# Patient Record
Sex: Female | Born: 1935 | Race: White | Hispanic: No | Smoking: Current every day smoker
Health system: Southern US, Community
[De-identification: ages and names within clinical notes are randomized; demographics above are authoritative.]

## PROBLEM LIST (undated history)

## (undated) DIAGNOSIS — E785 Hyperlipidemia, unspecified: Secondary | ICD-10-CM

## (undated) DIAGNOSIS — R911 Solitary pulmonary nodule: Secondary | ICD-10-CM

## (undated) DIAGNOSIS — G629 Polyneuropathy, unspecified: Secondary | ICD-10-CM

## (undated) DIAGNOSIS — K635 Polyp of colon: Secondary | ICD-10-CM

## (undated) DIAGNOSIS — K449 Diaphragmatic hernia without obstruction or gangrene: Secondary | ICD-10-CM

## (undated) DIAGNOSIS — K219 Gastro-esophageal reflux disease without esophagitis: Secondary | ICD-10-CM

## (undated) DIAGNOSIS — K589 Irritable bowel syndrome without diarrhea: Secondary | ICD-10-CM

## (undated) DIAGNOSIS — K5790 Diverticulosis of intestine, part unspecified, without perforation or abscess without bleeding: Secondary | ICD-10-CM

## (undated) DIAGNOSIS — M1990 Unspecified osteoarthritis, unspecified site: Secondary | ICD-10-CM

## (undated) DIAGNOSIS — M5481 Occipital neuralgia: Secondary | ICD-10-CM

## (undated) DIAGNOSIS — K648 Other hemorrhoids: Secondary | ICD-10-CM

## (undated) DIAGNOSIS — T8859XA Other complications of anesthesia, initial encounter: Secondary | ICD-10-CM

## (undated) DIAGNOSIS — F32A Depression, unspecified: Secondary | ICD-10-CM

## (undated) DIAGNOSIS — D649 Anemia, unspecified: Secondary | ICD-10-CM

## (undated) HISTORY — PX: CATARACT EXTRACTION: SUR2

## (undated) HISTORY — DX: Unspecified osteoarthritis, unspecified site: M19.90

## (undated) HISTORY — DX: Polyneuropathy, unspecified: G62.9

## (undated) HISTORY — DX: Solitary pulmonary nodule: R91.1

## (undated) HISTORY — DX: Irritable bowel syndrome, unspecified: K58.9

## (undated) HISTORY — DX: Gastro-esophageal reflux disease without esophagitis: K21.9

## (undated) HISTORY — DX: Other hemorrhoids: K64.8

## (undated) HISTORY — DX: Occipital neuralgia: M54.81

## (undated) HISTORY — DX: Hyperlipidemia, unspecified: E78.5

## (undated) HISTORY — DX: Polyp of colon: K63.5

## (undated) HISTORY — PX: CHOLECYSTECTOMY: SHX55

## (undated) HISTORY — DX: Anemia, unspecified: D64.9

## (undated) HISTORY — DX: Diaphragmatic hernia without obstruction or gangrene: K44.9

## (undated) HISTORY — DX: Diverticulosis of intestine, part unspecified, without perforation or abscess without bleeding: K57.90

## (undated) HISTORY — DX: Depression, unspecified: F32.A

---

## 1957-02-12 HISTORY — PX: APPENDECTOMY: SHX54

## 1970-02-12 HISTORY — PX: ABDOMINAL HYSTERECTOMY: SHX81

## 1998-01-05 ENCOUNTER — Observation Stay (HOSPITAL_COMMUNITY): Admission: RE | Admit: 1998-01-05 | Discharge: 1998-01-06 | Payer: Self-pay | Admitting: Surgery

## 1998-01-05 ENCOUNTER — Encounter: Payer: Self-pay | Admitting: Surgery

## 1999-02-14 ENCOUNTER — Observation Stay (HOSPITAL_COMMUNITY): Admission: RE | Admit: 1999-02-14 | Discharge: 1999-02-15 | Payer: Self-pay | Admitting: Urology

## 1999-05-10 ENCOUNTER — Encounter: Admission: RE | Admit: 1999-05-10 | Discharge: 1999-05-10 | Payer: Self-pay | Admitting: Internal Medicine

## 1999-05-10 ENCOUNTER — Encounter: Payer: Self-pay | Admitting: Internal Medicine

## 1999-05-19 ENCOUNTER — Encounter: Payer: Self-pay | Admitting: Internal Medicine

## 1999-05-19 ENCOUNTER — Encounter: Admission: RE | Admit: 1999-05-19 | Discharge: 1999-05-19 | Payer: Self-pay | Admitting: Internal Medicine

## 1999-05-22 ENCOUNTER — Ambulatory Visit (HOSPITAL_COMMUNITY): Admission: RE | Admit: 1999-05-22 | Discharge: 1999-05-22 | Payer: Self-pay | Admitting: Neurosurgery

## 1999-05-24 ENCOUNTER — Encounter: Payer: Self-pay | Admitting: Neurosurgery

## 1999-05-24 ENCOUNTER — Ambulatory Visit (HOSPITAL_COMMUNITY): Admission: RE | Admit: 1999-05-24 | Discharge: 1999-05-24 | Payer: Self-pay | Admitting: Neurosurgery

## 2000-03-05 ENCOUNTER — Encounter: Admission: RE | Admit: 2000-03-05 | Discharge: 2000-03-05 | Payer: Self-pay | Admitting: Internal Medicine

## 2000-03-05 ENCOUNTER — Encounter: Payer: Self-pay | Admitting: Internal Medicine

## 2000-03-18 ENCOUNTER — Other Ambulatory Visit: Admission: RE | Admit: 2000-03-18 | Discharge: 2000-03-18 | Payer: Self-pay | Admitting: Internal Medicine

## 2000-08-16 ENCOUNTER — Ambulatory Visit (HOSPITAL_COMMUNITY): Admission: RE | Admit: 2000-08-16 | Discharge: 2000-08-16 | Payer: Self-pay | Admitting: Urology

## 2001-03-12 ENCOUNTER — Encounter: Admission: RE | Admit: 2001-03-12 | Discharge: 2001-03-12 | Payer: Self-pay | Admitting: Internal Medicine

## 2001-03-12 ENCOUNTER — Encounter: Payer: Self-pay | Admitting: Internal Medicine

## 2002-03-19 ENCOUNTER — Encounter: Admission: RE | Admit: 2002-03-19 | Discharge: 2002-03-19 | Payer: Self-pay | Admitting: Internal Medicine

## 2002-03-19 ENCOUNTER — Encounter: Payer: Self-pay | Admitting: Internal Medicine

## 2003-03-22 ENCOUNTER — Encounter: Admission: RE | Admit: 2003-03-22 | Discharge: 2003-03-22 | Payer: Self-pay | Admitting: Internal Medicine

## 2004-03-08 ENCOUNTER — Ambulatory Visit: Payer: Self-pay | Admitting: Internal Medicine

## 2004-03-22 ENCOUNTER — Encounter: Admission: RE | Admit: 2004-03-22 | Discharge: 2004-03-22 | Payer: Self-pay | Admitting: Internal Medicine

## 2004-04-26 ENCOUNTER — Encounter: Admission: RE | Admit: 2004-04-26 | Discharge: 2004-04-26 | Payer: Self-pay | Admitting: Internal Medicine

## 2005-04-10 ENCOUNTER — Encounter: Admission: RE | Admit: 2005-04-10 | Discharge: 2005-04-10 | Payer: Self-pay | Admitting: Internal Medicine

## 2005-04-26 ENCOUNTER — Encounter: Admission: RE | Admit: 2005-04-26 | Discharge: 2005-04-26 | Payer: Self-pay | Admitting: Internal Medicine

## 2005-07-31 ENCOUNTER — Encounter: Admission: RE | Admit: 2005-07-31 | Discharge: 2005-07-31 | Payer: Self-pay | Admitting: Internal Medicine

## 2006-09-16 ENCOUNTER — Ambulatory Visit: Payer: Self-pay | Admitting: Internal Medicine

## 2006-10-16 ENCOUNTER — Encounter: Admission: RE | Admit: 2006-10-16 | Discharge: 2006-10-16 | Payer: Self-pay | Admitting: Internal Medicine

## 2006-11-28 ENCOUNTER — Ambulatory Visit: Payer: Self-pay | Admitting: Internal Medicine

## 2006-11-28 ENCOUNTER — Encounter: Payer: Self-pay | Admitting: Internal Medicine

## 2007-06-08 ENCOUNTER — Encounter: Admission: RE | Admit: 2007-06-08 | Discharge: 2007-06-08 | Payer: Self-pay | Admitting: Orthopaedic Surgery

## 2007-06-24 ENCOUNTER — Encounter: Admission: RE | Admit: 2007-06-24 | Discharge: 2007-06-24 | Payer: Self-pay | Admitting: Orthopaedic Surgery

## 2007-10-17 ENCOUNTER — Encounter: Admission: RE | Admit: 2007-10-17 | Discharge: 2007-10-17 | Payer: Self-pay | Admitting: Internal Medicine

## 2008-10-20 ENCOUNTER — Encounter: Admission: RE | Admit: 2008-10-20 | Discharge: 2008-10-20 | Payer: Self-pay | Admitting: Internal Medicine

## 2009-10-26 ENCOUNTER — Encounter: Admission: RE | Admit: 2009-10-26 | Discharge: 2009-10-26 | Payer: Self-pay | Admitting: Internal Medicine

## 2009-10-28 ENCOUNTER — Emergency Department (HOSPITAL_COMMUNITY): Admission: EM | Admit: 2009-10-28 | Discharge: 2009-10-28 | Payer: Self-pay | Admitting: Emergency Medicine

## 2010-02-27 ENCOUNTER — Encounter
Admission: RE | Admit: 2010-02-27 | Discharge: 2010-02-27 | Payer: Self-pay | Source: Home / Self Care | Attending: Internal Medicine | Admitting: Internal Medicine

## 2010-03-05 ENCOUNTER — Encounter: Payer: Self-pay | Admitting: Internal Medicine

## 2010-04-24 ENCOUNTER — Encounter (INDEPENDENT_AMBULATORY_CARE_PROVIDER_SITE_OTHER): Payer: Self-pay | Admitting: *Deleted

## 2010-05-02 NOTE — Letter (Signed)
Summary: New Patient letter  [facility]  [address] [place], [place] [address]   Phone: [phone]  Fax: [phone]       04/24/2010 MRN: [id number]  Candice Brown [address] [place], [place]  [address]  Dear Ms. Roberson,  Welcome to the Gastroenterology Division at [facility].    You are scheduled to see Dr.  Yancey Flemings on June 01, 2010 at 9:30am on the 3rd floor at [facility], [address]  We ask that you try to arrive at our office 15 minutes prior to your appointment time to allow for check-in.  We would like you to complete the enclosed self-administered evaluation form prior to your visit and bring it with you on the day of your appointment.  We will review it with you.  Also, please bring a complete list of all your medications or, if you prefer, bring the medication bottles and we will list them.  Please bring your insurance card so that we may make a copy of it.  If your insurance requires a referral to see a specialist, please bring your referral form from your primary care physician.  Co-payments are due at the time of your visit and may be paid by cash, check or credit card.     Your office visit will consist of a consult with your physician (includes a physical exam), any laboratory testing he/she may order, scheduling of any necessary diagnostic testing (e.g. x-ray, ultrasound, CT-scan), and scheduling of a procedure (e.g. Endoscopy, Colonoscopy) if required.  Please allow enough time on your schedule to allow for any/all of these possibilities.    If you cannot keep your appointment, please call [phone] to cancel or reschedule prior to your appointment date.  This allows [place] the opportunity to schedule an appointment for another patient in need of care.  If you do not cancel or reschedule by 5 p.m. the business day prior to your appointment date, you will be charged a $50.00 late cancellation/no-show fee.    Thank you for  choosing [facility] for your medical needs.  We appreciate the opportunity to care for you.  Please visit [place] at our website  to learn more about our practice.                     Sincerely,                                                             The Gastroenterology Division

## 2010-06-01 ENCOUNTER — Encounter: Payer: Self-pay | Admitting: Internal Medicine

## 2010-06-01 ENCOUNTER — Ambulatory Visit (INDEPENDENT_AMBULATORY_CARE_PROVIDER_SITE_OTHER): Payer: Medicare Other | Admitting: Internal Medicine

## 2010-06-01 VITALS — BP 98/60 | HR 68 | Ht 64.0 in | Wt 141.0 lb

## 2010-06-01 DIAGNOSIS — K219 Gastro-esophageal reflux disease without esophagitis: Secondary | ICD-10-CM

## 2010-06-01 DIAGNOSIS — R198 Other specified symptoms and signs involving the digestive system and abdomen: Secondary | ICD-10-CM

## 2010-06-01 DIAGNOSIS — K5900 Constipation, unspecified: Secondary | ICD-10-CM

## 2010-06-01 DIAGNOSIS — R933 Abnormal findings on diagnostic imaging of other parts of digestive tract: Secondary | ICD-10-CM

## 2010-06-01 NOTE — Progress Notes (Signed)
HISTORY OF PRESENT ILLNESS:  Candice Brown is a 77 y.o. female with the below listed medical history who presents today regarding difficulty with bowel movements and abnormal physical sensation. She has been seen previously for reflux disease and screening colonoscopy. Prior upper endoscopy revealed erosive esophagitis and peptic stricture. Treatment with proton pump inhibitor therapy resolved symptoms. Currently with no active upper GI symptoms. Prior colonoscopies in 2003 and 2008 revealing diverticulosis, internal hemorrhoids, cecal angiodysplasia, and hyperplastic polyps. She presents today with chief complaint of "a large pouch on the right side of the rectum making it difficult to defecate" of 6 months duration. She states that this "pouch" is internal and varies in size with time. She also states her bowel habits have been more difficult with straining as well as being hard and large. It was recommended that she take stool softeners. She resisted. She does take fiber tablets. She denies abdominal pain or bleeding. GI review of systems is otherwise negative. She apparently had some abdominal pain on the left in January 2012 when she underwent a CT scan of the abdomen and pelvis which was unremarkable except for incidental diverticulosis, lumbar scoliosis with spondylosis, and emphysema. Her appetite and weight have been stable  REVIEW OF SYSTEMS:  All non-GI ROS negative except for chronic back pain, leg cramps, and insomnia.  Past Medical History  Diagnosis Date  . Diverticulosis   . Arthritis   . Hyperlipidemia   . IBS (irritable bowel syndrome)     Past Surgical History  Procedure Date  . Cholecystectomy   . Appendectomy 1959  . Abdominal hysterectomy 1972    Social History Candice Brown  reports that she has been smoking Cigarettes.  She has been smoking about 1 pack per day. She does not have any smokeless tobacco history on file. She reports that she drinks alcohol. She reports  that she does not use illicit drugs.  family history includes Heart disease in her father.  There is no history of Colon cancer.  Allergies  Allergen Reactions  . Neosporin (Neomycin-Polymyx-Gramicid) Rash       PHYSICAL EXAMINATION: Vital signs: BP 98/60  Pulse 68  Ht 5\' 4"  (1.626 m)  Wt 141 lb (63.957 kg)  BMI 24.20 kg/m2  Constitutional: generally well-appearing, no acute distress Psychiatric: alert and oriented x3, cooperative Eyes: extraocular movements intact, anicteric, conjunctiva pink Mouth: oral pharynx moist, no lesions Neck: supple no lymphadenopathy Cardiovascular: heart regular rate and rhythm, no murmur Lungs: clear to auscultation bilaterally Abdomen: soft, nontender, nondistended, no obvious ascites, no peritoneal signs, normal bowel sounds, no organomegaly Rectal: No external abnormalities. No obvious mass or pouch. Internal hemorrhoids, one prolapsing. Not inflamed. No internal mass or tenderness. Stool is Hemoccult negative. Extremities: no lower extremity edema bilaterally Skin: no lesions on visible extremities Neuro: No focal deficits. No asterixis.   ASSESSMENT:  #1. Abnormal sensation in the right rectum associated with difficult bowel movements. Suspect problem is constipation with increased stool burden in the rectal vault leading to symptoms. She is resistant to this idea and feels there something anatomic to explain the symptom.  #2. Colonoscopy in 2003 and 2008 as described. Negative CT scan January 2012 of the abdomen and pelvis  #3. Constipation  #4. GERD with erosive esophagitis and peptic stricture. Currently asymptomatic on omeprazole   PLAN:  #1. Advise a trial of MiraLax. Titrate to need to achieve easier bowel movements #2. MRI of the Pelvis to further evaluate the perirectal area for subtle abnormalities not  appreciated on physical exam #3. Continue reflux precautions and omeprazole #4. Further recommendations to follow

## 2010-06-01 NOTE — Patient Instructions (Addendum)
MRI of Pelvis with and without contrast   [facility]   Monday 4/23 7:00 pm arrive at 6:45 pm We will contact you once the results are available for Dr. Marina Goodell to review.  MiraLax as recommended by Dr. Marina Goodell

## 2010-06-02 ENCOUNTER — Encounter: Payer: Self-pay | Admitting: Internal Medicine

## 2010-06-02 ENCOUNTER — Telehealth: Payer: Self-pay | Admitting: *Deleted

## 2010-06-02 NOTE — Telephone Encounter (Signed)
Message copied by Milford Cage on Fri Jun 02, 2010  5:38 PM ------      Message from: Yancey Flemings      Created: Fri Jun 02, 2010 11:48 AM      Regarding: RE: MRI scheduled Monday 4/23       Ask PA/NP to call for me. I can speak w/ them first Re why MRI.            jp      ----- Message -----         From: Milford Cage, CMA         Sent: 06/02/2010  10:24 AM           To: Yancey Flemings, MD      Subject: MRI scheduled Monday 4/23                                            ----- Message -----         From: Dwan Bolt         Sent: 06/02/2010   9:07 AM           To: Milford Cage, CMA            [employer]) will not approve her MRI Pelvis without a Peer-to-Peer review. They have all clinical information but still request to speak to Dr Marina Goodell or a NP/PA can call also.            [phone] opt 2      MemberID# [id number]            Pt's MRI is scheduled for Monday, 06/05/10

## 2010-06-02 NOTE — Telephone Encounter (Signed)
Called patient to inform her the insurance co. Will not approve her MRI.  Amy Esterwood, PA called and tried to give them clinical information to get this approved.  Suggestions were given by insurance co. And relayed to Dr. Marina Goodell in note given by Amy.   Called and cxld MRI until further notice from by Dr. Marina Goodell.

## 2010-06-05 ENCOUNTER — Other Ambulatory Visit (HOSPITAL_COMMUNITY): Payer: Medicare Other

## 2010-06-05 NOTE — Telephone Encounter (Signed)
Dr. Lenard Galloway do you want to do for this patient?  Please advise since insurance will not pay for MRI.  Thanks.

## 2010-06-16 NOTE — Telephone Encounter (Signed)
Nothing else to do.  Patient notified MRI not covered.

## 2010-06-16 NOTE — Telephone Encounter (Signed)
Pt. Notified of denial.   Dr. Marina Goodell wishes not to do anything further.

## 2010-06-30 NOTE — Op Note (Signed)
[facility]  Patient:    DEONI, COSEY                          MRN: [id number]. Date: 08/16/00 Attending:  Jamison Neighbor, M.D.                           Operative Report  SERVICE:  Urology.  PREOPERATIVE DIAGNOSIS:  Stress urinary incontinence type 3.  POSTOPERATIVE DIAGNOSIS:  Stress urinary incontinence type 3.  PROCEDURE:  Cystoscopy and collagen injection.  SURGEON:  Dr. Logan Bores.  ANESTHESIA:  General.  COMPLICATIONS:  None.  DRAINS:  None.  BRIEF HISTORY:  This 77 year old female status post pubovaginal sling. She had excellent urinary control but has over the last few months has begun to develop a modest degree of stress urinary incontinence. Careful evaluation showed that she had no elevated post void residual, she had good support for the urethra with no hypermobility but was felt to have some degree of stress incontinence based on type 3 incontinence. The patient was given the option of collagen injection versus redo of her sling and elected to undergo the simpler collagen injection. The patient understands the risks and benefits of the procedure and gave full and informed consent.  DESCRIPTION OF PROCEDURE:  After adequate IV sedation, the patient was placed in the dorsal lithotomy position and prepped with Betadine and draped in the usual sterile fashion. Cystoscopic examination showed the bladder neck was well supported but slightly patulous. The bladder itself was carefully inspected and was free of any tumor or stones. The patient had a very modest cystocele but this was nonobstructing anyway and the patient has minimal post void residual. The patient underwent injection using the needle stabilizer and operating bridge. The patient had a total of two syringes placed into the submucosal space, one on the right and one on the left, very nicely filling the area and causing excellent coaptation. It was felt that more injection  and this might place her into retention. The patient tolerated the procedure well and was taken to the recovery room in good condition. DD:  08/16/00 TD:  08/16/00 Job: [id number]

## 2010-10-06 ENCOUNTER — Other Ambulatory Visit: Payer: Self-pay | Admitting: Dermatology

## 2010-10-06 ENCOUNTER — Other Ambulatory Visit: Payer: Self-pay | Admitting: Internal Medicine

## 2010-10-06 DIAGNOSIS — Z1231 Encounter for screening mammogram for malignant neoplasm of breast: Secondary | ICD-10-CM

## 2010-10-24 ENCOUNTER — Other Ambulatory Visit: Payer: Self-pay | Admitting: Internal Medicine

## 2010-10-24 DIAGNOSIS — M542 Cervicalgia: Secondary | ICD-10-CM

## 2010-10-25 ENCOUNTER — Ambulatory Visit
Admission: RE | Admit: 2010-10-25 | Discharge: 2010-10-25 | Disposition: A | Discharge status: Home / Self Care | Payer: Medicare Other | Source: Ambulatory Visit | Attending: Internal Medicine | Admitting: Internal Medicine

## 2010-10-25 DIAGNOSIS — M542 Cervicalgia: Secondary | ICD-10-CM

## 2010-11-09 ENCOUNTER — Ambulatory Visit
Admission: RE | Admit: 2010-11-09 | Discharge: 2010-11-09 | Disposition: A | Discharge status: Home / Self Care | Payer: Medicare Other | Source: Ambulatory Visit | Attending: Internal Medicine | Admitting: Internal Medicine

## 2010-11-09 DIAGNOSIS — Z1231 Encounter for screening mammogram for malignant neoplasm of breast: Secondary | ICD-10-CM

## 2011-03-08 ENCOUNTER — Other Ambulatory Visit: Payer: Self-pay | Admitting: Internal Medicine

## 2011-03-08 DIAGNOSIS — M542 Cervicalgia: Secondary | ICD-10-CM

## 2011-03-08 DIAGNOSIS — K219 Gastro-esophageal reflux disease without esophagitis: Secondary | ICD-10-CM | POA: Diagnosis not present

## 2011-03-08 DIAGNOSIS — I10 Essential (primary) hypertension: Secondary | ICD-10-CM | POA: Diagnosis not present

## 2011-03-08 DIAGNOSIS — E785 Hyperlipidemia, unspecified: Secondary | ICD-10-CM | POA: Diagnosis not present

## 2011-03-10 ENCOUNTER — Ambulatory Visit
Admission: RE | Admit: 2011-03-10 | Discharge: 2011-03-10 | Disposition: A | Discharge status: Home / Self Care | Payer: Medicare Other | Source: Ambulatory Visit | Attending: Internal Medicine | Admitting: Internal Medicine

## 2011-03-10 DIAGNOSIS — M542 Cervicalgia: Secondary | ICD-10-CM

## 2011-03-10 DIAGNOSIS — M5020 Other cervical disc displacement, unspecified cervical region: Secondary | ICD-10-CM | POA: Diagnosis not present

## 2011-03-10 DIAGNOSIS — M5030 Other cervical disc degeneration, unspecified cervical region: Secondary | ICD-10-CM | POA: Diagnosis not present

## 2011-03-10 DIAGNOSIS — M47812 Spondylosis without myelopathy or radiculopathy, cervical region: Secondary | ICD-10-CM | POA: Diagnosis not present

## 2011-03-15 DIAGNOSIS — M47812 Spondylosis without myelopathy or radiculopathy, cervical region: Secondary | ICD-10-CM | POA: Diagnosis not present

## 2011-03-15 DIAGNOSIS — M5030 Other cervical disc degeneration, unspecified cervical region: Secondary | ICD-10-CM | POA: Diagnosis not present

## 2011-03-15 DIAGNOSIS — M5020 Other cervical disc displacement, unspecified cervical region: Secondary | ICD-10-CM | POA: Diagnosis not present

## 2011-03-15 DIAGNOSIS — M542 Cervicalgia: Secondary | ICD-10-CM | POA: Diagnosis not present

## 2011-03-16 DIAGNOSIS — M5412 Radiculopathy, cervical region: Secondary | ICD-10-CM | POA: Diagnosis not present

## 2011-03-16 DIAGNOSIS — M4802 Spinal stenosis, cervical region: Secondary | ICD-10-CM | POA: Diagnosis not present

## 2011-03-16 DIAGNOSIS — M47812 Spondylosis without myelopathy or radiculopathy, cervical region: Secondary | ICD-10-CM | POA: Diagnosis not present

## 2011-03-29 DIAGNOSIS — M47812 Spondylosis without myelopathy or radiculopathy, cervical region: Secondary | ICD-10-CM | POA: Diagnosis not present

## 2011-03-29 DIAGNOSIS — M5030 Other cervical disc degeneration, unspecified cervical region: Secondary | ICD-10-CM | POA: Diagnosis not present

## 2011-03-29 DIAGNOSIS — M542 Cervicalgia: Secondary | ICD-10-CM | POA: Diagnosis not present

## 2011-05-03 DIAGNOSIS — M4802 Spinal stenosis, cervical region: Secondary | ICD-10-CM | POA: Diagnosis not present

## 2011-05-03 DIAGNOSIS — M5412 Radiculopathy, cervical region: Secondary | ICD-10-CM | POA: Diagnosis not present

## 2011-05-03 DIAGNOSIS — M5030 Other cervical disc degeneration, unspecified cervical region: Secondary | ICD-10-CM | POA: Diagnosis not present

## 2011-05-03 DIAGNOSIS — M47812 Spondylosis without myelopathy or radiculopathy, cervical region: Secondary | ICD-10-CM | POA: Diagnosis not present

## 2011-05-29 DIAGNOSIS — I10 Essential (primary) hypertension: Secondary | ICD-10-CM | POA: Diagnosis not present

## 2011-05-29 DIAGNOSIS — K219 Gastro-esophageal reflux disease without esophagitis: Secondary | ICD-10-CM | POA: Diagnosis not present

## 2011-05-29 DIAGNOSIS — E785 Hyperlipidemia, unspecified: Secondary | ICD-10-CM | POA: Diagnosis not present

## 2011-05-29 DIAGNOSIS — M531 Cervicobrachial syndrome: Secondary | ICD-10-CM | POA: Diagnosis not present

## 2011-06-12 DIAGNOSIS — Z780 Asymptomatic menopausal state: Secondary | ICD-10-CM | POA: Diagnosis not present

## 2011-07-31 DIAGNOSIS — M5030 Other cervical disc degeneration, unspecified cervical region: Secondary | ICD-10-CM | POA: Diagnosis not present

## 2011-11-01 DIAGNOSIS — M5030 Other cervical disc degeneration, unspecified cervical region: Secondary | ICD-10-CM | POA: Diagnosis not present

## 2011-11-13 DIAGNOSIS — M542 Cervicalgia: Secondary | ICD-10-CM | POA: Diagnosis not present

## 2011-11-15 DIAGNOSIS — M542 Cervicalgia: Secondary | ICD-10-CM | POA: Diagnosis not present

## 2011-11-26 DIAGNOSIS — G609 Hereditary and idiopathic neuropathy, unspecified: Secondary | ICD-10-CM | POA: Diagnosis not present

## 2011-11-26 DIAGNOSIS — I10 Essential (primary) hypertension: Secondary | ICD-10-CM | POA: Diagnosis not present

## 2011-11-26 DIAGNOSIS — H2510 Age-related nuclear cataract, unspecified eye: Secondary | ICD-10-CM | POA: Diagnosis not present

## 2011-11-26 DIAGNOSIS — E785 Hyperlipidemia, unspecified: Secondary | ICD-10-CM | POA: Diagnosis not present

## 2011-11-27 DIAGNOSIS — M542 Cervicalgia: Secondary | ICD-10-CM | POA: Diagnosis not present

## 2011-11-29 ENCOUNTER — Other Ambulatory Visit: Payer: Self-pay | Admitting: Internal Medicine

## 2011-11-29 DIAGNOSIS — Z1231 Encounter for screening mammogram for malignant neoplasm of breast: Secondary | ICD-10-CM

## 2011-11-29 DIAGNOSIS — M542 Cervicalgia: Secondary | ICD-10-CM | POA: Diagnosis not present

## 2011-12-03 DIAGNOSIS — M542 Cervicalgia: Secondary | ICD-10-CM | POA: Diagnosis not present

## 2011-12-04 DIAGNOSIS — I10 Essential (primary) hypertension: Secondary | ICD-10-CM | POA: Diagnosis not present

## 2011-12-04 DIAGNOSIS — Z0000 Encounter for general adult medical examination without abnormal findings: Secondary | ICD-10-CM | POA: Diagnosis not present

## 2011-12-04 DIAGNOSIS — E785 Hyperlipidemia, unspecified: Secondary | ICD-10-CM | POA: Diagnosis not present

## 2011-12-04 DIAGNOSIS — G609 Hereditary and idiopathic neuropathy, unspecified: Secondary | ICD-10-CM | POA: Diagnosis not present

## 2011-12-04 DIAGNOSIS — Z23 Encounter for immunization: Secondary | ICD-10-CM | POA: Diagnosis not present

## 2011-12-06 DIAGNOSIS — M542 Cervicalgia: Secondary | ICD-10-CM | POA: Diagnosis not present

## 2011-12-10 DIAGNOSIS — Z1212 Encounter for screening for malignant neoplasm of rectum: Secondary | ICD-10-CM | POA: Diagnosis not present

## 2011-12-12 DIAGNOSIS — M542 Cervicalgia: Secondary | ICD-10-CM | POA: Diagnosis not present

## 2011-12-14 DIAGNOSIS — M542 Cervicalgia: Secondary | ICD-10-CM | POA: Diagnosis not present

## 2011-12-17 DIAGNOSIS — M542 Cervicalgia: Secondary | ICD-10-CM | POA: Diagnosis not present

## 2011-12-21 DIAGNOSIS — M542 Cervicalgia: Secondary | ICD-10-CM | POA: Diagnosis not present

## 2011-12-24 DIAGNOSIS — M542 Cervicalgia: Secondary | ICD-10-CM | POA: Diagnosis not present

## 2011-12-28 ENCOUNTER — Ambulatory Visit
Admission: RE | Admit: 2011-12-28 | Discharge: 2011-12-28 | Disposition: A | Discharge status: Home / Self Care | Payer: Medicare Other | Source: Ambulatory Visit | Attending: Internal Medicine | Admitting: Internal Medicine

## 2011-12-28 DIAGNOSIS — M542 Cervicalgia: Secondary | ICD-10-CM | POA: Diagnosis not present

## 2011-12-28 DIAGNOSIS — Z1231 Encounter for screening mammogram for malignant neoplasm of breast: Secondary | ICD-10-CM | POA: Diagnosis not present

## 2012-01-02 DIAGNOSIS — M542 Cervicalgia: Secondary | ICD-10-CM | POA: Diagnosis not present

## 2012-01-07 DIAGNOSIS — M542 Cervicalgia: Secondary | ICD-10-CM | POA: Diagnosis not present

## 2012-01-18 DIAGNOSIS — M542 Cervicalgia: Secondary | ICD-10-CM | POA: Diagnosis not present

## 2012-01-23 DIAGNOSIS — M542 Cervicalgia: Secondary | ICD-10-CM | POA: Diagnosis not present

## 2012-01-31 DIAGNOSIS — M47812 Spondylosis without myelopathy or radiculopathy, cervical region: Secondary | ICD-10-CM | POA: Diagnosis not present

## 2012-01-31 DIAGNOSIS — M542 Cervicalgia: Secondary | ICD-10-CM | POA: Diagnosis not present

## 2012-01-31 DIAGNOSIS — M5412 Radiculopathy, cervical region: Secondary | ICD-10-CM | POA: Diagnosis not present

## 2012-01-31 DIAGNOSIS — M5030 Other cervical disc degeneration, unspecified cervical region: Secondary | ICD-10-CM | POA: Diagnosis not present

## 2012-02-25 DIAGNOSIS — M47812 Spondylosis without myelopathy or radiculopathy, cervical region: Secondary | ICD-10-CM | POA: Diagnosis not present

## 2012-02-25 DIAGNOSIS — M5030 Other cervical disc degeneration, unspecified cervical region: Secondary | ICD-10-CM | POA: Diagnosis not present

## 2012-02-28 DIAGNOSIS — H3560 Retinal hemorrhage, unspecified eye: Secondary | ICD-10-CM | POA: Diagnosis not present

## 2012-04-08 DIAGNOSIS — M542 Cervicalgia: Secondary | ICD-10-CM | POA: Diagnosis not present

## 2012-05-28 DIAGNOSIS — E785 Hyperlipidemia, unspecified: Secondary | ICD-10-CM | POA: Diagnosis not present

## 2012-05-28 DIAGNOSIS — G609 Hereditary and idiopathic neuropathy, unspecified: Secondary | ICD-10-CM | POA: Diagnosis not present

## 2012-05-28 DIAGNOSIS — I10 Essential (primary) hypertension: Secondary | ICD-10-CM | POA: Diagnosis not present

## 2012-05-28 DIAGNOSIS — K219 Gastro-esophageal reflux disease without esophagitis: Secondary | ICD-10-CM | POA: Diagnosis not present

## 2012-05-28 DIAGNOSIS — Z6826 Body mass index (BMI) 26.0-26.9, adult: Secondary | ICD-10-CM | POA: Diagnosis not present

## 2012-06-10 DIAGNOSIS — I671 Cerebral aneurysm, nonruptured: Secondary | ICD-10-CM | POA: Diagnosis not present

## 2012-06-10 DIAGNOSIS — I728 Aneurysm of other specified arteries: Secondary | ICD-10-CM | POA: Diagnosis not present

## 2012-11-19 DIAGNOSIS — L570 Actinic keratosis: Secondary | ICD-10-CM | POA: Diagnosis not present

## 2012-11-19 DIAGNOSIS — T148 Other injury of unspecified body region: Secondary | ICD-10-CM | POA: Diagnosis not present

## 2012-11-20 ENCOUNTER — Other Ambulatory Visit: Payer: Self-pay

## 2012-11-20 DIAGNOSIS — Z1231 Encounter for screening mammogram for malignant neoplasm of breast: Secondary | ICD-10-CM

## 2012-12-01 DIAGNOSIS — I10 Essential (primary) hypertension: Secondary | ICD-10-CM | POA: Diagnosis not present

## 2012-12-01 DIAGNOSIS — E785 Hyperlipidemia, unspecified: Secondary | ICD-10-CM | POA: Diagnosis not present

## 2012-12-01 DIAGNOSIS — R82998 Other abnormal findings in urine: Secondary | ICD-10-CM | POA: Diagnosis not present

## 2012-12-08 DIAGNOSIS — Z0000 Encounter for general adult medical examination without abnormal findings: Secondary | ICD-10-CM | POA: Diagnosis not present

## 2012-12-08 DIAGNOSIS — M531 Cervicobrachial syndrome: Secondary | ICD-10-CM | POA: Diagnosis not present

## 2012-12-08 DIAGNOSIS — Z1331 Encounter for screening for depression: Secondary | ICD-10-CM | POA: Diagnosis not present

## 2012-12-08 DIAGNOSIS — I69998 Other sequelae following unspecified cerebrovascular disease: Secondary | ICD-10-CM | POA: Diagnosis not present

## 2012-12-08 DIAGNOSIS — Z1212 Encounter for screening for malignant neoplasm of rectum: Secondary | ICD-10-CM | POA: Diagnosis not present

## 2012-12-08 DIAGNOSIS — Z6826 Body mass index (BMI) 26.0-26.9, adult: Secondary | ICD-10-CM | POA: Diagnosis not present

## 2012-12-08 DIAGNOSIS — Z23 Encounter for immunization: Secondary | ICD-10-CM | POA: Diagnosis not present

## 2012-12-08 DIAGNOSIS — K219 Gastro-esophageal reflux disease without esophagitis: Secondary | ICD-10-CM | POA: Diagnosis not present

## 2012-12-08 DIAGNOSIS — I10 Essential (primary) hypertension: Secondary | ICD-10-CM | POA: Diagnosis not present

## 2012-12-08 DIAGNOSIS — G609 Hereditary and idiopathic neuropathy, unspecified: Secondary | ICD-10-CM | POA: Diagnosis not present

## 2012-12-08 DIAGNOSIS — E785 Hyperlipidemia, unspecified: Secondary | ICD-10-CM | POA: Diagnosis not present

## 2012-12-15 DIAGNOSIS — I69998 Other sequelae following unspecified cerebrovascular disease: Secondary | ICD-10-CM | POA: Diagnosis not present

## 2012-12-29 ENCOUNTER — Ambulatory Visit
Admission: RE | Admit: 2012-12-29 | Discharge: 2012-12-29 | Disposition: A | Discharge status: Home / Self Care | Payer: Medicare Other | Source: Ambulatory Visit

## 2012-12-29 DIAGNOSIS — Z1231 Encounter for screening mammogram for malignant neoplasm of breast: Secondary | ICD-10-CM

## 2013-01-06 DIAGNOSIS — H2510 Age-related nuclear cataract, unspecified eye: Secondary | ICD-10-CM | POA: Diagnosis not present

## 2013-04-29 DIAGNOSIS — I69998 Other sequelae following unspecified cerebrovascular disease: Secondary | ICD-10-CM | POA: Diagnosis not present

## 2013-05-04 DIAGNOSIS — I69998 Other sequelae following unspecified cerebrovascular disease: Secondary | ICD-10-CM | POA: Diagnosis not present

## 2013-06-15 DIAGNOSIS — G609 Hereditary and idiopathic neuropathy, unspecified: Secondary | ICD-10-CM | POA: Diagnosis not present

## 2013-06-15 DIAGNOSIS — E785 Hyperlipidemia, unspecified: Secondary | ICD-10-CM | POA: Diagnosis not present

## 2013-06-15 DIAGNOSIS — Z6826 Body mass index (BMI) 26.0-26.9, adult: Secondary | ICD-10-CM | POA: Diagnosis not present

## 2013-06-15 DIAGNOSIS — I10 Essential (primary) hypertension: Secondary | ICD-10-CM | POA: Diagnosis not present

## 2013-06-15 DIAGNOSIS — K219 Gastro-esophageal reflux disease without esophagitis: Secondary | ICD-10-CM | POA: Diagnosis not present

## 2013-10-16 ENCOUNTER — Other Ambulatory Visit: Payer: Self-pay

## 2013-10-16 DIAGNOSIS — Z1231 Encounter for screening mammogram for malignant neoplasm of breast: Secondary | ICD-10-CM

## 2013-11-24 DIAGNOSIS — Z23 Encounter for immunization: Secondary | ICD-10-CM | POA: Diagnosis not present

## 2013-12-11 DIAGNOSIS — E785 Hyperlipidemia, unspecified: Secondary | ICD-10-CM | POA: Diagnosis not present

## 2013-12-11 DIAGNOSIS — R8290 Unspecified abnormal findings in urine: Secondary | ICD-10-CM | POA: Diagnosis not present

## 2013-12-11 DIAGNOSIS — R8299 Other abnormal findings in urine: Secondary | ICD-10-CM | POA: Diagnosis not present

## 2013-12-11 DIAGNOSIS — I10 Essential (primary) hypertension: Secondary | ICD-10-CM | POA: Diagnosis not present

## 2013-12-18 DIAGNOSIS — I10 Essential (primary) hypertension: Secondary | ICD-10-CM | POA: Diagnosis not present

## 2013-12-18 DIAGNOSIS — Z0000 Encounter for general adult medical examination without abnormal findings: Secondary | ICD-10-CM | POA: Diagnosis not present

## 2013-12-18 DIAGNOSIS — E785 Hyperlipidemia, unspecified: Secondary | ICD-10-CM | POA: Diagnosis not present

## 2013-12-18 DIAGNOSIS — Z6825 Body mass index (BMI) 25.0-25.9, adult: Secondary | ICD-10-CM | POA: Diagnosis not present

## 2013-12-18 DIAGNOSIS — G629 Polyneuropathy, unspecified: Secondary | ICD-10-CM | POA: Diagnosis not present

## 2013-12-18 DIAGNOSIS — Z1212 Encounter for screening for malignant neoplasm of rectum: Secondary | ICD-10-CM | POA: Diagnosis not present

## 2013-12-24 ENCOUNTER — Other Ambulatory Visit: Payer: Self-pay | Admitting: Internal Medicine

## 2013-12-24 DIAGNOSIS — R110 Nausea: Secondary | ICD-10-CM

## 2013-12-28 DIAGNOSIS — H25032 Anterior subcapsular polar age-related cataract, left eye: Secondary | ICD-10-CM | POA: Diagnosis not present

## 2013-12-28 DIAGNOSIS — H2513 Age-related nuclear cataract, bilateral: Secondary | ICD-10-CM | POA: Diagnosis not present

## 2013-12-28 DIAGNOSIS — H18413 Arcus senilis, bilateral: Secondary | ICD-10-CM | POA: Diagnosis not present

## 2013-12-30 ENCOUNTER — Ambulatory Visit: Payer: Medicare Other

## 2013-12-30 ENCOUNTER — Ambulatory Visit
Admission: RE | Admit: 2013-12-30 | Discharge: 2013-12-30 | Disposition: A | Discharge status: Home / Self Care | Payer: Medicare Other | Source: Ambulatory Visit

## 2013-12-30 ENCOUNTER — Encounter (INDEPENDENT_AMBULATORY_CARE_PROVIDER_SITE_OTHER): Payer: Self-pay

## 2013-12-30 DIAGNOSIS — Z1231 Encounter for screening mammogram for malignant neoplasm of breast: Secondary | ICD-10-CM

## 2013-12-31 ENCOUNTER — Ambulatory Visit
Admission: RE | Admit: 2013-12-31 | Discharge: 2013-12-31 | Disposition: A | Discharge status: Home / Self Care | Payer: Medicare Other | Source: Ambulatory Visit | Attending: Internal Medicine | Admitting: Internal Medicine

## 2013-12-31 DIAGNOSIS — K5730 Diverticulosis of large intestine without perforation or abscess without bleeding: Secondary | ICD-10-CM | POA: Diagnosis not present

## 2013-12-31 DIAGNOSIS — Z9049 Acquired absence of other specified parts of digestive tract: Secondary | ICD-10-CM | POA: Diagnosis not present

## 2013-12-31 DIAGNOSIS — R110 Nausea: Secondary | ICD-10-CM

## 2013-12-31 DIAGNOSIS — K5900 Constipation, unspecified: Secondary | ICD-10-CM | POA: Diagnosis not present

## 2014-01-01 ENCOUNTER — Encounter: Payer: Self-pay | Admitting: Internal Medicine

## 2014-01-22 DIAGNOSIS — K219 Gastro-esophageal reflux disease without esophagitis: Secondary | ICD-10-CM | POA: Diagnosis not present

## 2014-01-22 DIAGNOSIS — G4762 Sleep related leg cramps: Secondary | ICD-10-CM | POA: Diagnosis not present

## 2014-01-22 DIAGNOSIS — R110 Nausea: Secondary | ICD-10-CM | POA: Diagnosis not present

## 2014-01-22 DIAGNOSIS — Z6825 Body mass index (BMI) 25.0-25.9, adult: Secondary | ICD-10-CM | POA: Diagnosis not present

## 2014-02-23 ENCOUNTER — Encounter: Payer: Self-pay | Admitting: Gastroenterology

## 2014-02-24 ENCOUNTER — Encounter: Payer: Self-pay | Admitting: Physician Assistant

## 2014-02-24 ENCOUNTER — Ambulatory Visit (INDEPENDENT_AMBULATORY_CARE_PROVIDER_SITE_OTHER): Payer: Medicare HMO | Admitting: Physician Assistant

## 2014-02-24 VITALS — BP 118/58 | HR 70 | Ht 64.0 in | Wt 143.0 lb

## 2014-02-24 DIAGNOSIS — R1013 Epigastric pain: Secondary | ICD-10-CM

## 2014-02-24 DIAGNOSIS — R110 Nausea: Secondary | ICD-10-CM

## 2014-02-24 DIAGNOSIS — K5900 Constipation, unspecified: Secondary | ICD-10-CM

## 2014-02-24 NOTE — Patient Instructions (Signed)

## 2014-02-24 NOTE — Progress Notes (Signed)
Agree with assessment and initial plans

## 2014-02-24 NOTE — Progress Notes (Signed)
Patient ID: Candice Brown, female   DOB: 03/26/1931, 81 y.o.   MRN: [id number]    HPI:   Jermani is an 81 year old female referred for evaluation by Dr. Reynaldo Minium due to nausea and epigastric pain.  Julya has a long-standing history of diverticulosis, arthritis, hyperlipidemia, and IBS. She has been followed by Dr. In the past and was last seen in April 2012. She reports that in September 2015 through Thanksgiving of 2015 she had nausea that was constant. She was evaluated by Dr. Reynaldo Minium in November and had an abdominal CT that was normal she was given a trial of an antiemetic which provided minimal relief she then went to visit her granddaughter in [place] for 5 days and states while in [place] she had no symptoms and felt great however upon returning to [place] she is again nauseous again. She was evaluated by Dr. Jacquiline Doe PA a few weeks ago and was started on pantoprazole. At the time of that exam she was noted to have epigastric tenderness. She reports she has been adhering to an antireflux regimen but continues to feel nauseous. Her nausea is worse on an empty stomach and better if she has carbs. She also restarted smoking this fall because she states it makes her feel better. She has no heartburn. She has no dysphagia to solids but occasionally feels she coughs on her own saliva.  She also states that she has been constipated for years she uses FiberCon but feels "my lower intestine has shifted into my right hip socket". She states she has to push on the right side of her rectum to evacuate a bowel movement. She has a bowel movement daily but says her stools are hard. She has had no bright red blood per rectum or melena. Prior colonoscopies in 2003 and 2008 revealed diverticulosis, internal hemorrhoids, cecal angiodysplasia, and hyperplastic polyps.   Past Medical History  Diagnosis Date  . Diverticulosis   . Arthritis   . Hyperlipidemia   . IBS (irritable bowel syndrome)   . Lung nodule   .  Peripheral neuropathy   . GERD (gastroesophageal reflux disease)   . HTN (hypertension)   . Occipital neuralgia     Past Surgical History  Procedure Laterality Date  . Cholecystectomy    . Appendectomy  1959  . Abdominal hysterectomy  1972   Family History  Problem Relation Age of Onset  . Heart disease Father   . Colon cancer Neg Hx   . CVA Father    History  Substance Use Topics  . Smoking status: Current Every Day Smoker -- 1.00 packs/day    Types: Cigarettes  . Smokeless tobacco: Never Used  . Alcohol Use: 0.0 oz/week    0 Not specified per week     Comment: less than 1/2 glass wine daily   Current Outpatient Prescriptions  Medication Sig Dispense Refill  . b complex vitamins tablet Take 1 tablet by mouth daily.      . Multiple Vitamin (MULTIVITAMIN) tablet Take 1 tablet by mouth daily.      . NON FORMULARY Take 1 tablet by mouth daily as needed. Airborne     . PANTOPRAZOLE SODIUM PO Take 40 mg by mouth 2 (two) times daily.    . polycarbophil (FIBERCON) 625 MG tablet Take 2 tablets by mouth every evening     . pregabalin (LYRICA) 150 MG capsule Take 150 mg by mouth daily.      . simvastatin (ZOCOR) 20 MG tablet Take 20 mg  by mouth at bedtime.       No current facility-administered medications for this visit.   Allergies  Allergen Reactions  . Neosporin [Neomycin-Polymyxin-Gramicidin] Rash     Review of Systems: Gen: Denies any fever, chills, sweats, anorexia, fatigue, weakness, malaise, weight loss, and sleep disorder CV: Denies chest pain, angina, palpitations, syncope, orthopnea, PND, peripheral edema, and claudication. Resp: Denies dyspnea at rest, dyspnea with exercise, cough, sputum, wheezing, coughing up blood, and pleurisy. GI: Denies vomiting blood, jaundice, and fecal incontinence.   Occasional dysphagia to saliva. GU : Denies urinary burning, blood in urine, urinary frequency, urinary hesitancy, nocturnal urination, and urinary incontinence. MS: Denies  joint pain, limitation of movement, and swelling, stiffness, low back pain, extremity pain. Denies muscle weakness, cramps, atrophy.  Derm: Denies rash, itching, dry skin, hives, moles, warts, or unhealing ulcers.  Psych: Denies depression, anxiety, memory loss, suicidal ideation, hallucinations, paranoia, and confusion. Heme: Denies bruising, bleeding, and enlarged lymph nodes. Neuro:  Denies any headaches, dizziness, paresthesias. Endo:  Denies any problems with DM, thyroid, adrenal function  Studies: es     Show images for CT Abdomen Pelvis Wo Contrast     Study Result     CLINICAL DATA: Nausea 2 months. Constipation. Previous appendectomy and cholecystectomy.  EXAM: CT ABDOMEN AND PELVIS WITHOUT CONTRAST  TECHNIQUE: Multidetector CT imaging of the abdomen and pelvis was performed following the standard protocol without IV contrast.  COMPARISON: 02/27/2010  FINDINGS: Lung bases demonstrate a 4 mm nodule over the right middle lobe unchanged from 2012 and therefore benign. Subtle dependent bibasilar atelectasis the heart is normal in size.  Abdominal images demonstrate surgical clips over the gallbladder fossa. The liver is otherwise unremarkable. The spleen, pancreas and adrenal glands are unremarkable. Kidneys are normal in size without evidence of hydronephrosis or nephrolithiasis. No evidence of focal mass. The ureters are within normal. There is moderate calcified plaque over the abdominal aorta and iliac arteries as well as over the origin of the left renal artery. Appendix is surgically absent.  There is diverticulosis of the colon most prominent over the descending and sigmoid colon. There is no active inflammation.  Pelvic images demonstrate surgical absence of the uterus. There is mild fecal retention over the rectum. The bladder is within normal. The there are mild degenerate changes of the spine and hips.  IMPRESSION: No acute findings in the  abdomen/ pelvis.  Diverticulosis of the colon.  Postsurgical changes as described.   Electronically Signed  By: Marin Olp M.D.  On: 12/31/2013 16:36      Prior Endoscopies:   See history of present illness.  Physical Exam: BP 118/58 mmHg  Pulse 70  Ht 5\' 4"  (1.626 m)  Wt 143 lb (64.864 kg)  BMI 24.53 kg/m2  SpO2 96% Constitutional: Pleasant,well-developed female in no acute distress. HEENT: Normocephalic and atraumatic. Conjunctivae are normal. No scleral icterus. Neck supple. No thyromegaly Cardiovascular: Normal rate, regular rhythm.  Pulmonary/chest: Effort normal and breath sounds normal. No wheezing, rales or rhonchi. Abdominal: Soft, nondistended, nontender. Bowel sounds active throughout. There are no masses palpable. No hepatomegaly. Extremities: no edema Lymphadenopathy: No cervical adenopathy noted. Neurological: Alert and oriented to person place and time. Skin: Skin is warm and dry. No rashes noted. Psychiatric: Normal mood and affect. Behavior is normal.  ASSESSMENT AND PLAN: #1. Epigastric pain and nausea. He is may be due to recurrent GERD or possible gastritis or ulcer. An antireflux regimen has been reviewed. She's been advised to continue her pantoprazole. She  will be scheduled for an EGD to evaluate for esophagitis, gastritis, or ulcer.The risks, benefits, and alternatives to endoscopy with possible biopsy and possible dilation were discussed with the patient and they consent to proceed. The procedure will be scheduled with Dr. Henrene Pastor.  #2. Constipation. She's been advised to add P fruits to her diet, increase her fluid in continue her FiberCon. She will use Mira lax as needed as well.  Further recommendations will be made pending the findings of her upper endoscopy.    Daouda Lonzo, Vita Barley PA-C 02/24/2014, 3:42 PM

## 2014-02-26 ENCOUNTER — Ambulatory Visit: Payer: Medicare Other | Admitting: Internal Medicine

## 2014-03-15 ENCOUNTER — Encounter: Payer: BC Managed Care – PPO | Admitting: Internal Medicine

## 2014-03-15 HISTORY — PX: CATARACT EXTRACTION: SUR2

## 2014-03-17 ENCOUNTER — Encounter: Payer: Self-pay | Admitting: Internal Medicine

## 2014-03-17 ENCOUNTER — Ambulatory Visit (AMBULATORY_SURGERY_CENTER): Payer: Medicare PPO | Admitting: Internal Medicine

## 2014-03-17 VITALS — BP 116/61 | HR 60 | Temp 99.1°F | Resp 18 | Ht 64.0 in | Wt 143.0 lb

## 2014-03-17 DIAGNOSIS — K219 Gastro-esophageal reflux disease without esophagitis: Secondary | ICD-10-CM | POA: Diagnosis not present

## 2014-03-17 DIAGNOSIS — K222 Esophageal obstruction: Secondary | ICD-10-CM

## 2014-03-17 DIAGNOSIS — R110 Nausea: Secondary | ICD-10-CM

## 2014-03-17 DIAGNOSIS — R1013 Epigastric pain: Secondary | ICD-10-CM

## 2014-03-17 MED ORDER — SODIUM CHLORIDE 0.9 % IV SOLN: 500.0000 mL | INTRAVENOUS | Status: DC

## 2014-03-17 NOTE — Progress Notes (Signed)
A/ox3 pleased with MAC, report to Sheila RN 

## 2014-03-17 NOTE — Progress Notes (Signed)
Pt denies any allergies to eggs or soy.

## 2014-03-17 NOTE — Op Note (Signed)
[place]  [address] [place], [address]   ENDOSCOPY PROCEDURE REPORT  PATIENT: Michell, Giuliano  MR#: [id number] BIRTHDATE: 20-Jul-1931 , 82  yrs. old GENDER: female ENDOSCOPIST: Eustace Quail, MD REFERRED BY:  Burnard Bunting, M.D. PROCEDURE DATE:  03/17/2014 PROCEDURE:  EGD, diagnostic ASA CLASS:     Class II INDICATIONS:  nausea and epigastric pain. MEDICATIONS: Monitored anesthesia care and Propofol 80 mg IV TOPICAL ANESTHETIC: none  DESCRIPTION OF PROCEDURE: After the risks benefits and alternatives of the procedure were thoroughly explained, informed consent was obtained.  The LB [id number] endoscope was introduced through the mouth and advanced to the second portion of the duodenum , Without limitations.  The instrument was slowly withdrawn as the mucosa was fully examined.    EXAM:The esophagus revealed a large caliber distal esophageal ring but was otherwise normal.  Stomach was normal.  The duodenum was normal.  Retroflexed views revealed a hiatal hernia.     The scope was then withdrawn from the patient and the procedure completed.  COMPLICATIONS: There were no immediate complications.  ENDOSCOPIC IMPRESSION: 1. GERD (likely cause for nausea) 2. Incidental esophageal ring and small hiatal hernia  RECOMMENDATIONS: 1.  Anti-reflux regimen to be followed 2.  Continue pantoprazole 40 mg daily 3. Return to the care of Dr. Reynaldo Minium  REPEAT EXAM:  eSigned:  Eustace Quail, MD 03/17/2014 10:12 AM    MV:[id number] Reynaldo Minium, MD and The Patient

## 2014-03-17 NOTE — Patient Instructions (Signed)
YOU HAD AN ENDOSCOPIC PROCEDURE TODAY AT THE [facility]: Refer to the procedure report that was given to you for any specific questions about what was found during the examination.  If the procedure report does not answer your questions, please call your gastroenterologist to clarify.  If you requested that your care partner not be given the details of your procedure findings, then the procedure report has been included in a sealed envelope for you to review at your convenience later.  YOU SHOULD EXPECT: Some feelings of bloating in the abdomen. Passage of more gas than usual.  Walking can help get rid of the air that was put into your GI tract during the procedure and reduce the bloating. If you had a lower endoscopy (such as a colonoscopy or flexible sigmoidoscopy) you may notice spotting of blood in your stool or on the toilet paper. If you underwent a bowel prep for your procedure, then you may not have a normal bowel movement for a few days.  DIET: Your first meal following the procedure should be a light meal and then it is ok to progress to your normal diet.  A half-sandwich or bowl of soup is an example of a good first meal.  Heavy or fried foods are harder to digest and may make you feel nauseous or bloated.  Likewise meals heavy in dairy and vegetables can cause extra gas to form and this can also increase the bloating.  Drink plenty of fluids but you should avoid alcoholic beverages for 24 hours.  ACTIVITY: Your care partner should take you home directly after the procedure.  You should plan to take it easy, moving slowly for the rest of the day.  You can resume normal activity the day after the procedure however you should NOT DRIVE or use heavy machinery for 24 hours (because of the sedation medicines used during the test).    SYMPTOMS TO REPORT IMMEDIATELY: A gastroenterologist can be reached at any hour.  During normal business hours, 8:30 AM to 5:00 PM Monday through Friday,  call [phone].  After hours and on weekends, please call the GI answering service at [phone] who will take a message and have the physician on call contact you.    Following upper endoscopy (EGD)  Vomiting of blood or coffee ground material  New chest pain or pain under the shoulder blades  Painful or persistently difficult swallowing  New shortness of breath  Fever of 100F or higher  Black, tarry-looking stools  FOLLOW UP: If any biopsies were taken you will be contacted by phone or by letter within the next 1-3 weeks.  Call your gastroenterologist if you have not heard about the biopsies in 3 weeks.  Our staff will call the home number listed on your records the next business day following your procedure to check on you and address any questions or concerns that you may have at that time regarding the information given to you following your procedure. This is a courtesy call and so if there is no answer at the home number and we have not heard from you through the emergency physician on call, we will assume that you have returned to your regular daily activities without incident.  SIGNATURES/CONFIDENTIALITY: You and/or your care partner have signed paperwork which will be entered into your electronic medical record.  These signatures attest to the fact that that the information above on your After Visit Summary has been reviewed and is understood.  Full   responsibility of the confidentiality of this discharge information lies with you and/or your care-partner.   Resume medications. Information given on [place] with discharge instructions.

## 2014-03-18 ENCOUNTER — Telehealth: Payer: Self-pay | Admitting: *Deleted

## 2014-03-18 ENCOUNTER — Telehealth: Payer: Self-pay | Admitting: Internal Medicine

## 2014-03-18 NOTE — Telephone Encounter (Signed)
Called pt back, pt reports glands under chin are swollen and sore to touch, and has just begun to get sore to swallow, no progress notes about airway difficulties from EGD on 03/17/14, pt denies fever at this time, pt has not taken any tylenol yet, advised pt warm liquids or salt water gargle may help and she can take some tylenol for the discomfort, advised pt I would send note to Dr Henrene Pastor and get back with her-adm  Dr Henrene Pastor any additional thoughts?

## 2014-03-18 NOTE — Telephone Encounter (Signed)
Called pt back and advised if symptoms persist to follow-up with PCP per Dr Henrene Pastor, pt states she had gargled with salt water and taken tylenol and throat seemed to be better, just still sore under chin, pt verbalizes understanding-adm

## 2014-03-18 NOTE — Telephone Encounter (Signed)
Number identifier, left message, follow-up  

## 2014-03-18 NOTE — Telephone Encounter (Signed)
Complaints of sore swollen glands are unrelated to simple uneventful diagnostic endoscopy. Symptomatic measures as recommended are reasonable. If persists, needs to see PCP for further evaluation

## 2014-03-25 DIAGNOSIS — Z1389 Encounter for screening for other disorder: Secondary | ICD-10-CM | POA: Diagnosis not present

## 2014-03-25 DIAGNOSIS — Z6824 Body mass index (BMI) 24.0-24.9, adult: Secondary | ICD-10-CM | POA: Diagnosis not present

## 2014-03-25 DIAGNOSIS — E785 Hyperlipidemia, unspecified: Secondary | ICD-10-CM | POA: Diagnosis not present

## 2014-03-25 DIAGNOSIS — K219 Gastro-esophageal reflux disease without esophagitis: Secondary | ICD-10-CM | POA: Diagnosis not present

## 2014-03-25 DIAGNOSIS — I10 Essential (primary) hypertension: Secondary | ICD-10-CM | POA: Diagnosis not present

## 2014-03-25 DIAGNOSIS — K5790 Diverticulosis of intestine, part unspecified, without perforation or abscess without bleeding: Secondary | ICD-10-CM | POA: Diagnosis not present

## 2014-03-25 DIAGNOSIS — F17210 Nicotine dependence, cigarettes, uncomplicated: Secondary | ICD-10-CM | POA: Diagnosis not present

## 2014-08-09 ENCOUNTER — Other Ambulatory Visit: Payer: Self-pay

## 2014-10-20 DIAGNOSIS — Z23 Encounter for immunization: Secondary | ICD-10-CM | POA: Diagnosis not present

## 2014-10-20 DIAGNOSIS — G4762 Sleep related leg cramps: Secondary | ICD-10-CM | POA: Diagnosis not present

## 2014-10-20 DIAGNOSIS — Z6822 Body mass index (BMI) 22.0-22.9, adult: Secondary | ICD-10-CM | POA: Diagnosis not present

## 2014-10-20 DIAGNOSIS — E785 Hyperlipidemia, unspecified: Secondary | ICD-10-CM | POA: Diagnosis not present

## 2014-12-13 ENCOUNTER — Other Ambulatory Visit: Payer: Self-pay

## 2014-12-13 DIAGNOSIS — Z1231 Encounter for screening mammogram for malignant neoplasm of breast: Secondary | ICD-10-CM

## 2014-12-16 DIAGNOSIS — H25033 Anterior subcapsular polar age-related cataract, bilateral: Secondary | ICD-10-CM | POA: Diagnosis not present

## 2014-12-16 DIAGNOSIS — H2513 Age-related nuclear cataract, bilateral: Secondary | ICD-10-CM | POA: Diagnosis not present

## 2014-12-16 DIAGNOSIS — H1045 Other chronic allergic conjunctivitis: Secondary | ICD-10-CM | POA: Diagnosis not present

## 2014-12-22 DIAGNOSIS — I10 Essential (primary) hypertension: Secondary | ICD-10-CM | POA: Diagnosis not present

## 2014-12-22 DIAGNOSIS — R8290 Unspecified abnormal findings in urine: Secondary | ICD-10-CM | POA: Diagnosis not present

## 2014-12-29 DIAGNOSIS — F17210 Nicotine dependence, cigarettes, uncomplicated: Secondary | ICD-10-CM | POA: Diagnosis not present

## 2014-12-29 DIAGNOSIS — E785 Hyperlipidemia, unspecified: Secondary | ICD-10-CM | POA: Diagnosis not present

## 2014-12-29 DIAGNOSIS — I10 Essential (primary) hypertension: Secondary | ICD-10-CM | POA: Diagnosis not present

## 2014-12-29 DIAGNOSIS — M5481 Occipital neuralgia: Secondary | ICD-10-CM | POA: Diagnosis not present

## 2014-12-29 DIAGNOSIS — Z0000 Encounter for general adult medical examination without abnormal findings: Secondary | ICD-10-CM | POA: Diagnosis not present

## 2014-12-29 DIAGNOSIS — G629 Polyneuropathy, unspecified: Secondary | ICD-10-CM | POA: Diagnosis not present

## 2014-12-29 DIAGNOSIS — K219 Gastro-esophageal reflux disease without esophagitis: Secondary | ICD-10-CM | POA: Diagnosis not present

## 2014-12-29 DIAGNOSIS — Z6822 Body mass index (BMI) 22.0-22.9, adult: Secondary | ICD-10-CM | POA: Diagnosis not present

## 2015-01-17 ENCOUNTER — Ambulatory Visit
Admission: RE | Admit: 2015-01-17 | Discharge: 2015-01-17 | Disposition: A | Discharge status: Home / Self Care | Payer: Medicare PPO | Source: Ambulatory Visit

## 2015-01-17 DIAGNOSIS — Z1231 Encounter for screening mammogram for malignant neoplasm of breast: Secondary | ICD-10-CM | POA: Diagnosis not present

## 2015-01-17 DIAGNOSIS — B029 Zoster without complications: Secondary | ICD-10-CM | POA: Diagnosis not present

## 2015-02-04 DIAGNOSIS — H25012 Cortical age-related cataract, left eye: Secondary | ICD-10-CM | POA: Diagnosis not present

## 2015-02-04 DIAGNOSIS — H25011 Cortical age-related cataract, right eye: Secondary | ICD-10-CM | POA: Diagnosis not present

## 2015-02-04 DIAGNOSIS — H2512 Age-related nuclear cataract, left eye: Secondary | ICD-10-CM | POA: Diagnosis not present

## 2015-02-04 DIAGNOSIS — H2511 Age-related nuclear cataract, right eye: Secondary | ICD-10-CM | POA: Diagnosis not present

## 2015-07-29 ENCOUNTER — Encounter (HOSPITAL_COMMUNITY): Payer: Self-pay | Admitting: Emergency Medicine

## 2015-07-29 ENCOUNTER — Ambulatory Visit (HOSPITAL_COMMUNITY)
Admission: EM | Admit: 2015-07-29 | Discharge: 2015-07-29 | Disposition: A | Discharge status: Home / Self Care | Payer: Medicare Other | Attending: Family Medicine | Admitting: Family Medicine

## 2015-07-29 DIAGNOSIS — H00026 Hordeolum internum left eye, unspecified eyelid: Secondary | ICD-10-CM | POA: Diagnosis not present

## 2015-07-29 DIAGNOSIS — H109 Unspecified conjunctivitis: Secondary | ICD-10-CM | POA: Diagnosis not present

## 2015-07-29 MED ORDER — SULFACETAMIDE SODIUM 10 % OP SOLN: 2.0000 [drp] | OPHTHALMIC | Status: DC

## 2015-07-29 NOTE — Discharge Instructions (Signed)
How to Use Eye Drops and Eye Ointments  HOW TO APPLY EYE DROPS  Follow these steps when applying eye drops:  1. Wash your hands.  2. Tilt your head back.  3. Put a finger under your eye and use it to gently pull your lower lid downward. Keep that finger in place.  4. Using your other hand, hold the dropper between your thumb and index finger.  5. Position the dropper just over the edge of the lower lid. Hold it as close to your eye as you can without touching the dropper to your eye.  6. Steady your hand. One way to do this is to lean your index finger against your brow.  7. Look up.  8. Slowly and gently squeeze one drop of medicine into your eye.  9. Close your eye.  10. Place a finger between your lower eyelid and your nose. Press gently for 2 minutes. This increases the amount of time that the medicine is exposed to the eye. It also reduces side effects that can develop if the drop gets into the bloodstream through the nose.  HOW TO APPLY EYE OINTMENTS  Follow these steps when applying eye ointments:  1. Wash your hands.  2. Put a finger under your eye and use it to gently pull your lower lid downward. Keep that finger in place.  3. Using your other hand, place the tip of the tube between your thumb and index finger with the remaining fingers braced against your cheek or nose.  4. Hold the tube just over the edge of your lower lid without touching the tube to your lid or eyeball.  5. Look up.  6. Line the inner part of your lower lid with ointment.  7. Gently pull up on your upper lid and look down. This will force the ointment to spread over the surface of the eye.  8. Release the upper lid.  9. If you can, close your eyes for 1-2 minutes.  Do not rub your eyes. If you applied the ointment correctly, your vision will be blurry for a few minutes. This is normal.  ADDITIONAL INFORMATION   Make sure to use the eye drops or ointment as told by your health care provider.   If you have been told to use both eye  drops and an eye ointment, apply the eye drops first, then wait 3-4 minutes before you apply the ointment.   Try not to touch the tip of the dropper or tube to your eye. A dropper or tube that has touched the eye can become contaminated.     This information is not intended to replace advice given to you by your health care provider. Make sure you discuss any questions you have with your health care provider.     Document Released: 05/07/2000 Document Revised: 06/15/2014 Document Reviewed: 01/25/2014  Elsevier Interactive Patient Education 2016 [employer]vier Inc[employer]

## 2015-07-29 NOTE — ED Provider Notes (Signed)
CSN: [id number]     Arrival date & time 07/29/15  1817 History   None    No chief complaint on file.  (Consider location/radiation/quality/duration/timing/severity/associated sxs/prior Treatment) Patient is a 82 y.o. female presenting with eye problem. The history is provided by the patient.  Eye Problem Location:  L eye Quality:  Tearing Severity:  Moderate Onset quality:  Sudden Duration:  1 day Timing:  Constant Progression:  Worsening Chronicity:  New Relieved by:  Nothing Worsened by:  Nothing tried Ineffective treatments:  None tried Associated symptoms: redness     Past Medical History  Diagnosis Date  . Diverticulosis   . Arthritis   . Hyperlipidemia   . IBS (irritable bowel syndrome)   . Lung nodule   . Peripheral neuropathy   . GERD (gastroesophageal reflux disease)   . HTN (hypertension)   . Occipital neuralgia    Past Surgical History  Procedure Laterality Date  . Cholecystectomy    . Appendectomy  1959  . Abdominal hysterectomy  1972   Family History  Problem Relation Age of Onset  . Heart disease Father   . Colon cancer Neg Hx   . CVA Father    Social History  Substance Use Topics  . Smoking status: Current Every Day Smoker -- 1.00 packs/day    Types: Cigarettes  . Smokeless tobacco: Never Used  . Alcohol Use: 0.0 oz/week    0 Standard drinks or equivalent per week     Comment: less than 1/2 glass wine daily   OB History    No data available     Review of Systems  Constitutional: Negative.   HENT: Negative.   Eyes: Positive for redness.  Respiratory: Negative.   Cardiovascular: Negative.   Gastrointestinal: Negative.   Endocrine: Negative.   Genitourinary: Negative.   Musculoskeletal: Negative.   Skin: Negative.   Allergic/Immunologic: Negative.   Neurological: Negative.   Hematological: Negative.   Psychiatric/Behavioral: Negative.     Allergies  Oxycodone and Neosporin  Home Medications   Prior to Admission medications    Medication Sig Start Date End Date Taking? Authorizing Provider  b complex vitamins tablet Take 1 tablet by mouth daily.      Historical Provider, MD  Multiple Vitamin (MULTIVITAMIN) tablet Take 1 tablet by mouth daily.      Historical Provider, MD  NON FORMULARY Take 1 tablet by mouth daily as needed. Airborne     Historical Provider, MD  ondansetron (ZOFRAN-ODT) 4 MG disintegrating tablet as needed. 12/23/13   Historical Provider, MD  PANTOPRAZOLE SODIUM PO Take 40 mg by mouth 2 (two) times daily.    Historical Provider, MD  polycarbophil (FIBERCON) 625 MG tablet Take 2 tablets by mouth every evening     Historical Provider, MD  pregabalin (LYRICA) 150 MG capsule Take 150 mg by mouth daily.      Historical Provider, MD  simvastatin (ZOCOR) 20 MG tablet Take 20 mg by mouth at bedtime.      Historical Provider, MD   Meds Ordered and Administered this Visit  Medications - No data to display  BP 153/64 mmHg  Pulse 79  Temp(Src) 98.3 F (36.8 C) (Oral)  Resp 18  SpO2 96% No data found.   Physical Exam  Constitutional: She appears well-developed and well-nourished.  HENT:  Head: Normocephalic.  Right Ear: External ear normal.  Left Ear: External ear normal.  Mouth/Throat: Oropharynx is clear and moist.  Eyes: Pupils are equal, round, and reactive to light. Right eye  exhibits discharge.  Left lower eye lid with internal hordeolum.  Left lower conjunctiva injected.  Left peri-orbital erythema and left lower peri-orbital area with swelling.  Cardiovascular: Normal rate, regular rhythm and normal heart sounds.   Pulmonary/Chest: Effort normal and breath sounds normal.  Abdominal: Soft. Bowel sounds are normal.    ED Course  Procedures (including critical care time)  Labs Review Labs Reviewed - No data to display  Imaging Review No results found.   Visual Acuity Review  Right Eye Distance:   Left Eye Distance:   Bilateral Distance:    Right Eye Near:   Left Eye Near:     Bilateral Near:         MDM  Left internal hordeolum  Left conjunctivitis   Bleph 10 2 gtt's OS q 4 hours x 7days #16ml      Lysbeth Penner, FNP 07/29/15 1845

## 2015-07-29 NOTE — ED Notes (Signed)
Here for left eye problem onset today Sx today include: rash, swelling, pain Reports she had cataract surgery on 04/13/15 Denies inj/trauma... A&O x4... No acute distress.

## 2015-08-04 ENCOUNTER — Encounter: Payer: Self-pay | Admitting: Internal Medicine

## 2015-08-04 ENCOUNTER — Ambulatory Visit (INDEPENDENT_AMBULATORY_CARE_PROVIDER_SITE_OTHER): Payer: Medicare Other | Admitting: Internal Medicine

## 2015-08-04 VITALS — BP 124/62 | HR 60 | Ht 63.0 in | Wt 134.0 lb

## 2015-08-04 DIAGNOSIS — K219 Gastro-esophageal reflux disease without esophagitis: Secondary | ICD-10-CM | POA: Diagnosis not present

## 2015-08-04 DIAGNOSIS — K5909 Other constipation: Secondary | ICD-10-CM

## 2015-08-04 DIAGNOSIS — R110 Nausea: Secondary | ICD-10-CM

## 2015-08-04 DIAGNOSIS — K222 Esophageal obstruction: Secondary | ICD-10-CM

## 2015-08-04 NOTE — Progress Notes (Signed)
HISTORY OF PRESENT ILLNESS:  Candice Brown is a 82 y.o. female with past medical history as listed below who is referred today by Dr. Reynaldo Minium with chief complaints of nausea and constipation. The patient has been evaluated in this office previously for the same complaints. Evaluation in 2012 for abnormal sensation in the rectum associated with difficult bowel movements. See that dictation. At complaint persists and is unchanged. We schedule an MRI of the pelvis but this was not covered by insurance. She did undergo CT of the pelvis in 2012 and again 2015 by Dr. Reynaldo Minium. No acute abnormalities. Patient did try MiraLAX but would have occasional watery stools. She is fearful that she may develop fecal incontinence, as her mother had, though she's had absolutely no incontinence. She has undergone previous colonoscopy. Most recently October 2008 with left-sided diverticulosis, hyperplastic colon polyp, and cecal angiodysplasia. Next. She was evaluated January 2016 for very severe and persistent nausea. She underwent upper endoscopy February 2016 and was found to have a distal esophageal ring and small hiatal hernia. Cause for nausea was felt to be GERD. She was placed on pantoprazole 40 mg daily. Shortly thereafter she reports tremendous improvement in the symptom. She now only has rare bouts of minor nausea not requiring antiemetics and often improved with meals. She denies vomiting, weight loss, or other relevant issues.  REVIEW OF SYSTEMS:  All non-GI ROS negative except for back pain, visual change, night sweats, previous sleeping problems but no longer  Past Medical History  Diagnosis Date  . Diverticulosis   . Arthritis   . Hyperlipidemia   . IBS (irritable bowel syndrome)   . Lung nodule   . Peripheral neuropathy ([place])   . GERD (gastroesophageal reflux disease)   . HTN (hypertension)   . Occipital neuralgia   . Colon polyps     hyperplastic  . Hiatal hernia   . Internal hemorrhoids      Past Surgical History  Procedure Laterality Date  . Cholecystectomy    . Appendectomy  1959  . Abdominal hysterectomy  [place]  reports that she has been smoking Cigarettes.  She has been smoking about 1.00 pack per day. She has never used smokeless tobacco. She reports that she drinks alcohol. She reports that she does not use illicit drugs.  family history includes CVA in her father; Heart disease in her father. There is no history of Colon cancer.  Allergies  Allergen Reactions  . Oxycodone Nausea Only     ( made very sick)  . Neosporin [Neomycin-Polymyxin-Gramicidin] Rash       PHYSICAL EXAMINATION: Vital signs: BP 124/62 mmHg  Pulse 60  Ht 5\' 3"  (1.6 m)  Wt 134 lb (60.782 kg)  BMI 23.74 kg/m2  Constitutional: generally well-appearing, no acute distress Psychiatric: alert and oriented x3, cooperative Eyes: extraocular movements intact, anicteric, conjunctiva pink Mouth: oral pharynx moist, no lesions Neck: supple no lymphadenopathy Cardiovascular: heart regular rate and rhythm, no murmur Lungs: clear to auscultation bilaterally Abdomen: soft, nontender, nondistended, no obvious ascites, no peritoneal signs, normal bowel sounds, no organomegaly Rectal:Omitted Extremities: no clubbing cyanosis or lower extremity edema bilaterally Skin: no lesions on visible extremities Neuro: No focal deficits. Cranial nerves intact  ASSESSMENT:  #1. Nausea. Secondary to GERD. Upper endoscopy February 2016. Marked improvement on PPI therapy. Occasional rare symptoms without other features #2. Difficulties with bowel habits, mainly constipation and sense of incomplete evacuation. Likely pelvic floor dysfunction. Last colonoscopy 2008  PLAN:  #1. Reflux precautions #2. Continue pantoprazole 40 mg daily #3. Okay to use Zofran for rare episodes of severe nausea (which she has not had quite some time) #4. Patient was interested in suppository therapy  for her constipation. OTC therapy okay #5. GI follow-up as needed  25 minutes was spent face-to-face with the patient. Greater than 50% a time use for counseling regarding her nausea, GERD, and constipation issues

## 2015-08-04 NOTE — Patient Instructions (Signed)
Please follow up with Dr. Perry as needed 

## 2015-09-28 ENCOUNTER — Other Ambulatory Visit: Payer: Self-pay | Admitting: Orthopaedic Surgery

## 2015-09-28 DIAGNOSIS — M545 Low back pain: Secondary | ICD-10-CM

## 2015-09-28 DIAGNOSIS — I700 Atherosclerosis of aorta: Secondary | ICD-10-CM

## 2015-10-04 ENCOUNTER — Other Ambulatory Visit: Payer: Self-pay | Admitting: Orthopaedic Surgery

## 2015-10-04 ENCOUNTER — Ambulatory Visit
Admission: RE | Admit: 2015-10-04 | Discharge: 2015-10-04 | Disposition: A | Discharge status: Home / Self Care | Payer: Medicare Other | Source: Ambulatory Visit | Attending: Orthopaedic Surgery | Admitting: Orthopaedic Surgery

## 2015-10-04 DIAGNOSIS — I700 Atherosclerosis of aorta: Secondary | ICD-10-CM

## 2015-10-08 ENCOUNTER — Ambulatory Visit
Admission: RE | Admit: 2015-10-08 | Discharge: 2015-10-08 | Disposition: A | Discharge status: Home / Self Care | Payer: Medicare Other | Source: Ambulatory Visit | Attending: Orthopaedic Surgery | Admitting: Orthopaedic Surgery

## 2015-10-08 DIAGNOSIS — M545 Low back pain: Secondary | ICD-10-CM

## 2015-11-09 ENCOUNTER — Encounter: Payer: Self-pay | Admitting: Surgery

## 2015-11-16 ENCOUNTER — Ambulatory Visit (INDEPENDENT_AMBULATORY_CARE_PROVIDER_SITE_OTHER): Payer: Medicare Other | Admitting: Surgery

## 2015-11-16 VITALS — BP 115/60 | HR 62 | Temp 97.0°F | Resp 16 | Ht 63.0 in | Wt 136.0 lb

## 2015-11-16 DIAGNOSIS — I714 Abdominal aortic aneurysm, without rupture, unspecified: Secondary | ICD-10-CM

## 2015-11-16 NOTE — Progress Notes (Signed)
[facility] [place]  Patient name: Candice Brown MRN: [id number] DOB: [birth year] Sex: female  REFERRING PHYSICIAN: Dr. Edmonia James  REASON FOR CONSULT: AAA  HPI: Candice Brown is a 82 y.o. female, who is for today for evaluation of an abdominal aortic aneurysm.  This was detected during a workup for back disease.  She has an ultrasound that shows an ectatic aorta measuring 2.8 cm.  She denies any abdominal pain.  The patient has a history of a carotid aneurysm approximately 17 years ago it was detected when she had a small stroke.  It is followed at [facility] by neurology.  No intervention has been performed.  She does take an aspirin for antiplatelet therapy.  She takes Crestor for hypercholesterolemia.  Past Medical History:  Diagnosis Date  . Arthritis   . Colon polyps    hyperplastic  . Diverticulosis   . GERD (gastroesophageal reflux disease)   . Hiatal hernia   . HTN (hypertension)   . Hyperlipidemia   . IBS (irritable bowel syndrome)   . Internal hemorrhoids   . Lung nodule   . Occipital neuralgia   . Peripheral neuropathy ([facility])     Family History  Problem Relation Age of Onset  . Heart disease Father   . Colon cancer Neg Hx   . CVA Father     SOCIAL HISTORY: Social History   Social History  . Marital status: Divorced    Spouse name: N/A  . Number of children: 3  . Years of education: N/A   Occupational History  . retired    Social History Main Topics  . Smoking status: Current Every Day Smoker    Packs/day: 1.00    Types: Cigarettes  . Smokeless tobacco: Never Used  . Alcohol use 0.0 oz/week     Comment: less than 1/2 glass wine daily  . Drug use: No  . Sexual activity: Not on file   Other Topics Concern  . Not on file   Social History Narrative  . No narrative on file    Allergies  Allergen Reactions  . Oxycodone Nausea Only     ( made very sick)  . Neosporin  [Neomycin-Polymyxin-Gramicidin] Rash    Current Outpatient Prescriptions  Medication Sig Dispense Refill  . Calcium Carb-Cholecalciferol (CALCIUM-VITAMIN D) 600-400 MG-UNIT TABS Take 1 tablet by mouth daily.    . NON FORMULARY Take 1 tablet by mouth daily as needed. Airborne     . ondansetron (ZOFRAN-ODT) 4 MG disintegrating tablet as needed.  0  . PANTOPRAZOLE SODIUM PO Take 40 mg by mouth daily.     . polycarbophil (FIBERCON) 625 MG tablet Take 2 tablets by mouth every evening     . pregabalin (LYRICA) 150 MG capsule Take 150 mg by mouth daily.      . rosuvastatin (CRESTOR) 10 MG tablet Take 1 tablet by mouth 2 (two) times a week.  0   No current facility-administered medications for this visit.     REVIEW OF SYSTEMS:  [X]  denotes positive finding, [ ]  denotes negative finding Cardiac  Comments:  Chest pain or chest pressure:    Shortness of breath upon exertion:    Short of breath when lying flat:    Irregular heart rhythm:        Vascular    Pain in calf, thigh, or hip brought on by ambulation: x   Pain in feet at night that wakes you up from your sleep:  x  Blood clot in your veins:    Leg swelling:         Pulmonary    Oxygen at home:    Productive cough:     Wheezing:         Neurologic    Sudden weakness in arms or legs:     Sudden numbness in arms or legs:     Sudden onset of difficulty speaking or slurred speech:    Temporary loss of vision in one eye:     Problems with dizziness:         Gastrointestinal    Blood in stool:     Vomited blood:         Genitourinary    Burning when urinating:     Blood in urine:        Psychiatric    Major depression:         Hematologic    Bleeding problems:    Problems with blood clotting too easily:        Skin    Rashes or ulcers:        Constitutional    Fever or chills:      PHYSICAL EXAM: Vitals:   11/16/15 1019  BP: 115/60  Pulse: 62  Resp: 16  Temp: 97 F (36.1 C)  TempSrc: Oral  SpO2: 98%    Weight: 136 lb (61.7 kg)  Height: 5\' 3"  (1.6 m)    GENERAL: The patient is a well-nourished female, in no acute distress. The vital signs are documented above. CARDIAC: There is a regular rate and rhythm.  VASCULAR: No carotid bruits PULMONARY: There is good air exchange bilaterally without wheezing or rales. ABDOMEN: Soft and non-tender with normal pitched bowel sounds.  MUSCULOSKELETAL: There are no major deformities or cyanosis. NEUROLOGIC: No focal weakness or paresthesias are detected. SKIN: There are no ulcers or rashes noted. PSYCHIATRIC: The patient has a normal affect.  DATA:  I have reviewed her ultrasound which shows a infrarenal abdominal aorta measuring 2.8 cm.  I've also reviewed his CT scan from 2015 wished does not show any significant aneurysmal degeneration but she does have calcified plaque within her aortic wall.  ASSESSMENT AND PLAN: Small abdominal aortic aneurysm: I reassured the patient that her risk of rupture is extremely low at this size.  I would not recommend intervention unless the aneurysm enlarges to 5 cm.  The patient is on all the appropriate medications for cardiovascular disease, and therefore I would not make any medication changes.  We have agreed to continue with surveillance of her aorta.  I have her scheduled for an ultrasound in 2 years.   Annamarie Major, MD [facility] [phone] Pager [phone]

## 2015-12-12 ENCOUNTER — Other Ambulatory Visit: Payer: Self-pay | Admitting: Internal Medicine

## 2015-12-12 DIAGNOSIS — Z1231 Encounter for screening mammogram for malignant neoplasm of breast: Secondary | ICD-10-CM

## 2016-01-19 ENCOUNTER — Other Ambulatory Visit: Payer: Self-pay | Admitting: Internal Medicine

## 2016-01-19 ENCOUNTER — Ambulatory Visit
Admission: RE | Admit: 2016-01-19 | Discharge: 2016-01-19 | Disposition: A | Discharge status: Home / Self Care | Payer: Medicare Other | Source: Ambulatory Visit | Attending: Internal Medicine | Admitting: Internal Medicine

## 2016-01-19 DIAGNOSIS — F17210 Nicotine dependence, cigarettes, uncomplicated: Secondary | ICD-10-CM

## 2016-01-19 DIAGNOSIS — Z1231 Encounter for screening mammogram for malignant neoplasm of breast: Secondary | ICD-10-CM

## 2016-10-23 ENCOUNTER — Other Ambulatory Visit: Payer: Self-pay | Admitting: Internal Medicine

## 2016-10-23 DIAGNOSIS — Z1231 Encounter for screening mammogram for malignant neoplasm of breast: Secondary | ICD-10-CM

## 2017-01-21 ENCOUNTER — Ambulatory Visit: Payer: Medicare Other

## 2017-01-30 ENCOUNTER — Encounter: Payer: Self-pay | Admitting: Neurology

## 2017-02-19 ENCOUNTER — Ambulatory Visit: Payer: Medicare Other

## 2017-03-12 ENCOUNTER — Ambulatory Visit
Admission: RE | Admit: 2017-03-12 | Discharge: 2017-03-12 | Disposition: A | Discharge status: Home / Self Care | Payer: Medicare Other | Source: Ambulatory Visit | Attending: Internal Medicine | Admitting: Internal Medicine

## 2017-03-12 DIAGNOSIS — Z1231 Encounter for screening mammogram for malignant neoplasm of breast: Secondary | ICD-10-CM

## 2017-04-01 ENCOUNTER — Encounter: Payer: Self-pay | Admitting: *Deleted

## 2017-04-02 ENCOUNTER — Ambulatory Visit: Payer: Medicare Other | Admitting: Diagnostic Neuroimaging

## 2017-04-02 ENCOUNTER — Encounter: Payer: Self-pay | Admitting: Diagnostic Neuroimaging

## 2017-04-02 VITALS — Ht 64.0 in | Wt 125.8 lb

## 2017-04-02 DIAGNOSIS — G629 Polyneuropathy, unspecified: Secondary | ICD-10-CM | POA: Diagnosis not present

## 2017-04-02 DIAGNOSIS — R29898 Other symptoms and signs involving the musculoskeletal system: Secondary | ICD-10-CM

## 2017-04-02 NOTE — Progress Notes (Signed)
[facility]  PATIENT: Candice Brown DOB: 09-Dec-1931  REFERRING CLINICIAN: Deirdre Pippins / S [place] HISTORY FROM: patient  REASON FOR VISIT: new consult    HISTORICAL  CHIEF COMPLAINT:  Chief Complaint  Patient presents with  . Muscle weakness    rm 7, New Pt, "problem comes and goes, knees buckle, give way"     HISTORY OF PRESENT ILLNESS:   84 year old female here for evaluation of lower extremity intermittent weakness.  2-3 months ago patient had episode lasting for 2 hours of intermittent lower extremity weakness.  She felt like her knees were buckling and giving out.  Symptoms resolved.  Next day she had similar symptom for less duration.  She also had similar episode when resting her elbows on a table and felt like her arms were giving out.  Since that time no further symptoms.  No numbness appearing tingling headaches or vision changes.  Patient has diagnosis of idiopathic peripheral neuropathy.   REVIEW OF SYSTEMS: Full 14 system review of systems performed and negative with exception of: Weakness restless legs.  ALLERGIES: Allergies  Allergen Reactions  . Oxycodone Nausea Only     ( made very sick)  . Neosporin [Neomycin-Polymyxin-Gramicidin] Rash  . Tape Rash    HOME MEDICATIONS: Outpatient Medications Prior to Visit  Medication Sig Dispense Refill  . aspirin 325 MG EC tablet Take 325 mg by mouth daily.    . Calcium Carb-Cholecalciferol (CALCIUM-VITAMIN D) 600-400 MG-UNIT TABS Take 1 tablet by mouth daily.    . diphenhydramine-acetaminophen (TYLENOL PM) 25-500 MG TABS tablet Take 1 tablet by mouth at bedtime as needed.    . Magnesium 250 MG TABS Take by mouth.    Marland Kitchen PANTOPRAZOLE SODIUM PO Take 40 mg by mouth daily.     . polycarbophil (FIBERCON) 625 MG tablet Take 2 tablets by mouth every evening     . pregabalin (LYRICA) 150 MG capsule Take 150 mg by mouth daily.      . rosuvastatin (CRESTOR) 10 MG tablet Take 1 tablet by mouth 2 (two) times a  week.  0  . UNABLE TO FIND Med Name: OTC for leg cramps    . NON FORMULARY Take 1 tablet by mouth daily as needed. Airborne     . ondansetron (ZOFRAN-ODT) 4 MG disintegrating tablet as needed.  0   No facility-administered medications prior to visit.     PAST MEDICAL HISTORY: Past Medical History:  Diagnosis Date  . Arthritis   . Colon polyps    hyperplastic  . Diverticulosis   . GERD (gastroesophageal reflux disease)   . Hiatal hernia   . Hyperlipidemia   . IBS (irritable bowel syndrome)   . Internal hemorrhoids   . Lung nodule   . Occipital neuralgia   . Peripheral neuropathy     PAST SURGICAL HISTORY: Past Surgical History:  Procedure Laterality Date  . ABDOMINAL HYSTERECTOMY  1972  . APPENDECTOMY  1959  . CATARACT EXTRACTION Left 03/2014   right eye 06/2016  . CHOLECYSTECTOMY      FAMILY HISTORY: Family History  Problem Relation Age of Onset  . Heart disease Father   . CVA Father   . Colon cancer Neg Hx     SOCIAL HISTORY:  Social History   Socioeconomic History  . Marital status: Divorced    Spouse name: Not on file  . Number of children: 3  . Years of education: Masters  . Highest education level: Not on file  Social Needs  .  Financial resource strain: Not on file  . Food insecurity - worry: Not on file  . Food insecurity - inability: Not on file  . Transportation needs - medical: Not on file  . Transportation needs - non-medical: Not on file  Occupational History  . Occupation: retired  Tobacco Use  . Smoking status: Former Smoker    Packs/day: 1.00    Types: Cigarettes    Last attempt to quit: 10/13/2016    Years since quitting: 0.4  . Smokeless tobacco: Never Used  . Tobacco comment: 04/02/17 nicotene gum, lozenges  Substance and Sexual Activity  . Alcohol use: Yes    Alcohol/week: 0.0 oz    Comment: less than 1/2 glass wine daily  . Drug use: No  . Sexual activity: Not on file  Other Topics Concern  . Not on file  Social History  Narrative   Lives alone   Caffeine- coffee, 1- 1 1/2 cups daily     PHYSICAL EXAM  GENERAL EXAM/CONSTITUTIONAL: Vitals:  Vitals:   04/02/17 1544  Weight: 125 lb 12.8 oz (57.1 kg)  Height: 5\' 4"  (1.626 m)     Body mass index is 21.59 kg/m.  Visual Acuity Screening   Right eye Left eye Both eyes  Without correction:     With correction: 20/30 20/30      Patient is in no distress; well developed, nourished and groomed; neck is supple  CARDIOVASCULAR:  Examination of carotid arteries is normal; no carotid bruits  Regular rate and rhythm, no murmurs  Examination of peripheral vascular system by observation and palpation is normal  EYES:  Ophthalmoscopic exam of optic discs and posterior segments is normal; no papilledema or hemorrhages  MUSCULOSKELETAL:  Gait, strength, tone, movements noted in Neurologic exam below  NEUROLOGIC: MENTAL STATUS:  No flowsheet data found.  awake, alert, oriented to person, place and time  recent and remote memory intact  normal attention and concentration  language fluent, comprehension intact, naming intact,   fund of knowledge appropriate  CRANIAL NERVE:   2nd - no papilledema on fundoscopic exam  2nd, 3rd, 4th, 6th - pupils equal and reactive to light, visual fields full to confrontation, extraocular muscles intact, no nystagmus  5th - facial sensation symmetric  7th - facial strength symmetric  8th - hearing intact  9th - palate elevates symmetrically, uvula midline  11th - shoulder shrug symmetric  12th - tongue protrusion midline  MOTOR:   normal bulk and tone, full strength in the BUE, BLE  SENSORY:   normal and symmetric to light touch; EXCEPT ABSENT VIB AT TOES  COORDINATION:   finger-nose-finger, fine finger movements normal  REFLEXES:   deep tendon reflexes TRACE and symmetric; ABSENT AT ANKLES  GAIT/STATION:   narrow based gait; able to walk on toes, heels and tandem; romberg is  negative    DIAGNOSTIC DATA (LABS, IMAGING, TESTING) - I reviewed patient records, labs, notes, testing and imaging myself where available.  No results found for: WBC, HGB, HCT, MCV, PLT No results found for: NA, K, CL, CO2, GLUCOSE, BUN, CREATININE, CALCIUM, PROT, ALBUMIN, AST, ALT, ALKPHOS, BILITOT, GFRNONAA, GFRAA No results found for: CHOL, HDL, LDLCALC, LDLDIRECT, TRIG, CHOLHDL No results found for: HGBA1C No results found for: VITAMINB12 No results found for: TSH      ASSESSMENT AND PLAN  84 y.o. year old female here with history of idiopathic peripheral neuropathy, now with intermittent episodes of muscle weakness affecting lower extremities greater than upper extremities (1-2 months ago).  Neurologic examination today notable only for peripheral neuropathy.  No specific cause of transient muscle weakness found.  Would recommend to monitor symptoms for now.  His symptoms significantly increase or worsen may consider additional testing with EMG nerve conduction study, MRI cervical and lumbar spine and other testing.   Dx: idiopathic peripheral neuropathy + intermittent muscle weakness (unknown cause)  1. Neuropathy   2. Transient weakness of lower extremity      PLAN: - monitor symptoms  Return if symptoms worsen or fail to improve, for return to PCP.    Penni Bombard, MD 2/44/0102, 7:25 PM Certified in Neurology, Neurophysiology and Neuroimaging  [facility] [address], [place], [place] [address] [phone]

## 2017-05-20 ENCOUNTER — Ambulatory Visit: Payer: Medicare Other | Admitting: Neurology

## 2017-11-07 ENCOUNTER — Other Ambulatory Visit: Payer: Self-pay

## 2017-11-07 DIAGNOSIS — I714 Abdominal aortic aneurysm, without rupture, unspecified: Secondary | ICD-10-CM

## 2017-11-18 ENCOUNTER — Ambulatory Visit: Payer: Medicare Other | Admitting: Surgery

## 2017-11-18 ENCOUNTER — Other Ambulatory Visit: Payer: Self-pay

## 2017-11-18 ENCOUNTER — Encounter: Payer: Self-pay | Admitting: Surgery

## 2017-11-18 ENCOUNTER — Ambulatory Visit (HOSPITAL_COMMUNITY)
Admission: RE | Admit: 2017-11-18 | Discharge: 2017-11-18 | Disposition: A | Discharge status: Home / Self Care | Payer: Medicare Other | Source: Ambulatory Visit | Attending: Surgery | Admitting: Surgery

## 2017-11-18 VITALS — BP 116/66 | HR 56 | Resp 18 | Ht 64.0 in | Wt 127.8 lb

## 2017-11-18 DIAGNOSIS — I714 Abdominal aortic aneurysm, without rupture, unspecified: Secondary | ICD-10-CM

## 2017-11-18 NOTE — Progress Notes (Signed)
[facility] [place]  Patient name: Candice Brown MRN: [id number] DOB: Apr 22, 1931 Sex: female   REASON FOR VISIT:    Follow up  HISOTRY OF PRESENT ILLNESS:    Candice Brown is a 84 y.o. female, who is for today for evaluation of an abdominal aortic aneurysm.  This was detected during a workup for back disease.  She has an ultrasound that shows an ectatic aorta measuring 2.8 cm.  She denies any abdominal pain.  The patient has a history of a carotid aneurysm approximately 17 years ago it was detected when she had a small stroke.  It is followed at [facility] by neurology.  No intervention has been performed.    She sees them every 3 or 4 years.  She does take an aspirin for antiplatelet therapy.  She takes Crestor for hypercholesterolemia.  She denies any abdominal pain or back pain.  She occasionally gets leg cramps at night for which she takes over-the-counter medication.  She has also been having difficulty with her knees buckling.  She denies any symptoms of claudication.  She has not had any neurological events.  PAST MEDICAL HISTORY:   Past Medical History:  Diagnosis Date  . Arthritis   . Colon polyps    hyperplastic  . Diverticulosis   . GERD (gastroesophageal reflux disease)   . Hiatal hernia   . Hyperlipidemia   . IBS (irritable bowel syndrome)   . Internal hemorrhoids   . Lung nodule   . Occipital neuralgia   . Peripheral neuropathy      FAMILY HISTORY:   Family History  Problem Relation Age of Onset  . Heart disease Father   . CVA Father   . Colon cancer Neg Hx     SOCIAL HISTORY:   Social History   Tobacco Use  . Smoking status: Former Smoker    Packs/day: 1.00    Types: Cigarettes    Last attempt to quit: 10/13/2016    Years since quitting: 1.0  . Smokeless tobacco: Never Used  . Tobacco comment: 04/02/17 nicotene gum, lozenges  Substance Use Topics  . Alcohol use: Yes    Alcohol/week: 0.0  standard drinks    Comment: less than 1/2 glass wine daily     ALLERGIES:   Allergies  Allergen Reactions  . Oxycodone Nausea Only     ( made very sick)  . Neosporin [Neomycin-Polymyxin-Gramicidin] Rash  . Tape Rash     CURRENT MEDICATIONS:   Current Outpatient Medications  Medication Sig Dispense Refill  . aspirin 325 MG EC tablet Take 325 mg by mouth daily.    . Calcium Carb-Cholecalciferol (CALCIUM-VITAMIN D) 600-400 MG-UNIT TABS Take 1 tablet by mouth daily.    . diphenhydramine-acetaminophen (TYLENOL PM) 25-500 MG TABS tablet Take 1 tablet by mouth at bedtime as needed.    . Magnesium 250 MG TABS Take by mouth.    . NON FORMULARY Take 1 tablet by mouth daily as needed. Airborne     . PANTOPRAZOLE SODIUM PO Take 40 mg by mouth daily.     . polycarbophil (FIBERCON) 625 MG tablet Take 2 tablets by mouth every evening     . pregabalin (LYRICA) 150 MG capsule Take 150 mg by mouth daily.      . rosuvastatin (CRESTOR) 10 MG tablet Take 1 tablet by mouth 2 (two) times a week.  0  . UNABLE TO FIND Med Name: OTC for leg cramps     No current facility-administered  medications for this visit.     REVIEW OF SYSTEMS:   [X]  denotes positive finding, [ ]  denotes negative finding Cardiac  Comments:  Chest pain or chest pressure:    Shortness of breath upon exertion:    Short of breath when lying flat:    Irregular heart rhythm:        Vascular    Pain in calf, thigh, or hip brought on by ambulation:    Pain in feet at night that wakes you up from your sleep:  x   Blood clot in your veins:    Leg swelling:         Pulmonary    Oxygen at home:    Productive cough:     Wheezing:         Neurologic    Sudden weakness in arms or legs:     Sudden numbness in arms or legs:     Sudden onset of difficulty speaking or slurred speech:    Temporary loss of vision in one eye:     Problems with dizziness:         Gastrointestinal    Blood in stool:     Vomited blood:           Genitourinary    Burning when urinating:     Blood in urine:        Psychiatric    Major depression:         Hematologic    Bleeding problems:    Problems with blood clotting too easily:        Skin    Rashes or ulcers:        Constitutional    Fever or chills:      PHYSICAL EXAM:   There were no vitals filed for this visit.  GENERAL: The patient is a well-nourished female, in no acute distress. The vital signs are documented above. CARDIAC: There is a regular rate and rhythm.  VASCULAR: I cannot palpate pedal pulses.  No carotid bruits. PULMONARY: Non-labored respirations ABDOMEN: Soft and non-tender with normal pitched bowel sounds.  MUSCULOSKELETAL: There are no major deformities or cyanosis. NEUROLOGIC: No focal weakness or paresthesias are detected. SKIN: There are no ulcers or rashes noted. PSYCHIATRIC: The patient has a normal affect.  STUDIES:   I have ordered and reviewed her ultrasound.  Maximum aortic diameter is 2.8 cm  MEDICAL ISSUES:   AAA: No significant interval growth of her aneurysm.  Maximum diameter is 2.8 cm.  I will repeat her ultrasound in 2 years.  Carotid aneurysm: This is being followed with [facility].    Annamarie Major, MD [facility] [phone] Pager [phone]

## 2018-02-06 ENCOUNTER — Other Ambulatory Visit: Payer: Self-pay | Admitting: Internal Medicine

## 2018-02-06 DIAGNOSIS — Z1231 Encounter for screening mammogram for malignant neoplasm of breast: Secondary | ICD-10-CM

## 2018-03-13 ENCOUNTER — Other Ambulatory Visit: Payer: Self-pay | Admitting: Internal Medicine

## 2018-03-13 ENCOUNTER — Ambulatory Visit
Admission: RE | Admit: 2018-03-13 | Discharge: 2018-03-13 | Disposition: A | Discharge status: Home / Self Care | Payer: Medicare Other | Source: Ambulatory Visit | Attending: Internal Medicine | Admitting: Internal Medicine

## 2018-03-13 DIAGNOSIS — Z1231 Encounter for screening mammogram for malignant neoplasm of breast: Secondary | ICD-10-CM

## 2018-11-04 ENCOUNTER — Ambulatory Visit: Payer: Medicare Other | Admitting: Orthopaedic Surgery

## 2018-11-12 ENCOUNTER — Ambulatory Visit: Payer: Medicare Other | Admitting: Orthopaedic Surgery

## 2018-11-13 ENCOUNTER — Ambulatory Visit: Payer: Medicare Other | Admitting: Orthopaedic Surgery

## 2018-11-18 ENCOUNTER — Encounter: Payer: Self-pay | Admitting: Orthopaedic Surgery

## 2018-11-18 ENCOUNTER — Ambulatory Visit: Payer: Self-pay

## 2018-11-18 ENCOUNTER — Telehealth: Payer: Self-pay | Admitting: Orthopaedic Surgery

## 2018-11-18 ENCOUNTER — Ambulatory Visit (INDEPENDENT_AMBULATORY_CARE_PROVIDER_SITE_OTHER): Payer: Medicare Other | Admitting: Orthopaedic Surgery

## 2018-11-18 ENCOUNTER — Other Ambulatory Visit: Payer: Self-pay

## 2018-11-18 VITALS — BP 157/66 | HR 54 | Ht 64.0 in | Wt 122.0 lb

## 2018-11-18 DIAGNOSIS — M25562 Pain in left knee: Secondary | ICD-10-CM

## 2018-11-18 DIAGNOSIS — G8929 Other chronic pain: Secondary | ICD-10-CM | POA: Diagnosis not present

## 2018-11-18 DIAGNOSIS — M25561 Pain in right knee: Secondary | ICD-10-CM

## 2018-11-18 NOTE — Telephone Encounter (Signed)
Patient called stating the name of the doctor is Dr. Marlaine Hind.

## 2018-11-18 NOTE — Progress Notes (Signed)
Office Visit Note   Patient: Candice Brown           Date of Birth: [birth year]           MRN: [id number] Visit Date: 11/18/2018              Requested by: Candice Bunting, MD [address] [place],  [place] [address] PCP: Candice Bunting, MD   Assessment & Plan: Visit Diagnoses:  1. Chronic pain of both knees     Plan: Candice Brown has been experiencing a sensation of both of her knees giving way.  Her x-rays look surprisingly good for a patient of 85 with no acute changes and minimal arthritis.  I am not really sure why she is having her feeling of giving way but I would like her to have an appointment with a neurologist.  We will call and set that up.  My exam was relatively benign in terms of an orthopedic origin.Marland Kitchen  Specifically, I did not think she had an issue with her hips or back or her knees.  There was no distal edema neurologically appeared to be intact.  She still very active at age 85 and has not had any injuries or trauma.  Office visit approximately 45 minutes 50% of the time in counseling.  We will also order CBC and CMET  Follow-Up Instructions: Return if symptoms worsen or fail to improve.   Orders:  Orders Placed This Encounter  Procedures  . XR KNEE 3 VIEW LEFT  . XR KNEE 3 VIEW RIGHT  . COMPLETE METABOLIC PANEL WITH GFR  . CBC with Differential/Platelet   No orders of the defined types were placed in this encounter.     Procedures: No procedures performed   Clinical Data: No additional findings.   Subjective: Chief Complaint  Patient presents with  . Right Knee - Pain  . Left Knee - Pain  Patient presents today for bilateral knees.She said that in the past year they "buckle". She said that neither knee is painful. She has fallen twice in the last year. She said that her left side might be worse than the right.  The problem occurs "once in a while".  She feels like her legs just "give way.  She thinks it may be her knees but she is not sure.  She is  not had any injury or trauma.  She is not had any swelling or ecchymosis.  Has not had any significant back pain or numbness or tingling to either lower extremity.  Has not experienced any groin pain.  She has been involved with ballroom dancing and occasionally her partner will have to "catch me".  Sometimes she has some difficulty when she first wakes up in the morning.  Has a remote history of "scoliosis" but does not have any back pain.  She is not experiencing any numbness or tingling or feeling of heaviness in either lower extremity when she is up and about  HPI  Review of Systems  Constitutional: Negative for fatigue.  HENT: Negative for ear pain.   Eyes: Negative for pain.  Respiratory: Negative for shortness of breath.   Cardiovascular: Negative for leg swelling.  Gastrointestinal: Positive for constipation and diarrhea.  Endocrine: Negative for cold intolerance and heat intolerance.  Genitourinary: Negative for difficulty urinating.  Musculoskeletal: Negative for joint swelling.  Skin: Negative for rash.  Allergic/Immunologic: Negative for food allergies.  Neurological: Positive for weakness.  Hematological: Bruises/bleeds easily.  Psychiatric/Behavioral: Positive for sleep disturbance.  Objective: Vital Signs: BP (!) 157/66   Pulse (!) 54   Ht 5\' 4"  (1.626 m)   Wt 122 lb (55.3 kg)   BMI 20.94 kg/m   Physical Exam Constitutional:      Appearance: She is well-developed.  Eyes:     Pupils: Pupils are equal, round, and reactive to light.  Pulmonary:     Effort: Pulmonary effort is normal.  Skin:    General: Skin is warm and dry.  Neurological:     Mental Status: She is alert and oriented to person, place, and time.  Psychiatric:        Behavior: Behavior normal.     Ortho Exam awake alert and oriented x3.  Comfortable sitting.  Straight leg raise negative bilaterally.  Painless range of motion of both lower extremities and specifically, hips.  No atrophy of one  leg compared to the other.  No knee pain or effusion.  No instability with varus or valgus stress.  Slight anterior drawer sign bilaterally but symmetrical.  No distal edema.  Good pulses.  Feet were warm.  Neurologically intact.  Walks with a steady gait.  Specialty Comments:  No specialty comments available.  Imaging: Xr Knee 3 View Left  Result Date: 11/18/2018 Films of the left knee were obtained in several projections standing.  The joint spaces are well-maintained.  Normal alignment.  No acute change or ectopic calcification.  No peripheral osteophytes or subchondral sclerosis.  Mild decrease in the lateral patellofemoral joint space.  Surprisingly minimal appearing arthritis at age 85  Xr Knee 3 View Right  Result Date: 11/18/2018 Films of the right knee were obtained in several projections standing.  The appearance is similar to the left with minimal degenerative change given the patient's age of 85 no acute changes.  No ectopic calcification.  No acute changes.  Alignment appears to be neutral    PMFS History: Patient Active Problem List   Diagnosis Date Noted  . Knee pain, bilateral 11/18/2018   Past Medical History:  Diagnosis Date  . Arthritis   . Colon polyps    hyperplastic  . Diverticulosis   . GERD (gastroesophageal reflux disease)   . Hiatal hernia   . Hyperlipidemia   . IBS (irritable bowel syndrome)   . Internal hemorrhoids   . Lung nodule   . Occipital neuralgia   . Peripheral neuropathy     Family History  Problem Relation Age of Onset  . Heart disease Father   . CVA Father   . Breast cancer Mother 61  . Colon cancer Neg Hx     Past Surgical History:  Procedure Laterality Date  . ABDOMINAL HYSTERECTOMY  1972  . APPENDECTOMY  1959  . CATARACT EXTRACTION Left 03/2014   right eye 06/2016  . CHOLECYSTECTOMY     Social History   Occupational History  . Occupation: retired  Tobacco Use  . Smoking status: Former Smoker    Packs/day: 1.00     Types: Cigarettes    Quit date: 10/13/2016    Years since quitting: 2.0  . Smokeless tobacco: Never Used  . Tobacco comment: 04/02/17 nicotene gum, lozenges  Substance and Sexual Activity  . Alcohol use: Yes    Alcohol/week: 0.0 standard drinks    Comment: less than 1/2 glass wine daily  . Drug use: No  . Sexual activity: Not on file

## 2018-11-18 NOTE — Addendum Note (Signed)
Addended by: Lendon Collar on: 11/18/2018 04:01 PM   Modules accepted: Orders

## 2018-11-19 LAB — CBC WITH DIFFERENTIAL/PLATELET
Absolute Monocytes: 506 cells/uL (ref 200–950)
Basophils Absolute: 70 cells/uL (ref 0–200)
Basophils Relative: 1.1 %
Eosinophils Absolute: 218 cells/uL (ref 15–500)
Eosinophils Relative: 3.4 %
HCT: 38.2 % (ref 35.0–45.0)
Hemoglobin: 12.8 g/dL (ref 11.7–15.5)
Lymphs Abs: 1837 cells/uL (ref 850–3900)
MCH: 32.7 pg (ref 27.0–33.0)
MCHC: 33.5 g/dL (ref 32.0–36.0)
MCV: 97.7 fL (ref 80.0–100.0)
MPV: 10.4 fL (ref 7.5–12.5)
Monocytes Relative: 7.9 %
Neutro Abs: 3770 cells/uL (ref 1500–7800)
Neutrophils Relative %: 58.9 %
Platelets: 333 10*3/uL (ref 140–400)
RBC: 3.91 10*6/uL (ref 3.80–5.10)
RDW: 12.1 % (ref 11.0–15.0)
Total Lymphocyte: 28.7 %
WBC: 6.4 10*3/uL (ref 3.8–10.8)

## 2018-11-19 LAB — COMPLETE METABOLIC PANEL WITH GFR
AG Ratio: 1.6 (calc) (ref 1.0–2.5)
ALT: 10 U/L (ref 6–29)
AST: 20 U/L (ref 10–35)
Albumin: 4.1 g/dL (ref 3.6–5.1)
Alkaline phosphatase (APISO): 45 U/L (ref 37–153)
BUN: 21 mg/dL (ref 7–25)
CO2: 31 mmol/L (ref 20–32)
Calcium: 10.4 mg/dL (ref 8.6–10.4)
Chloride: 102 mmol/L (ref 98–110)
Creat: 0.86 mg/dL (ref 0.60–0.88)
GFR, Est African American: 71 mL/min/{1.73_m2} (ref >=60)
GFR, Est Non African American: 62 mL/min/{1.73_m2} (ref >=60)
Globulin: 2.6 g/dL (calc) (ref 1.9–3.7)
Glucose, Bld: 84 mg/dL (ref 65–99)
Potassium: 4.4 mmol/L (ref 3.5–5.3)
Sodium: 140 mmol/L (ref 135–146)
Total Bilirubin: 0.3 mg/dL (ref 0.2–1.2)
Total Protein: 6.7 g/dL (ref 6.1–8.1)

## 2018-12-31 ENCOUNTER — Ambulatory Visit: Payer: Medicare Other | Admitting: Diagnostic Neuroimaging

## 2018-12-31 ENCOUNTER — Other Ambulatory Visit: Payer: Self-pay

## 2018-12-31 ENCOUNTER — Encounter: Payer: Self-pay | Admitting: Diagnostic Neuroimaging

## 2018-12-31 VITALS — BP 124/82 | HR 64 | Temp 97.6°F | Ht 63.0 in | Wt 123.2 lb

## 2018-12-31 DIAGNOSIS — R29898 Other symptoms and signs involving the musculoskeletal system: Secondary | ICD-10-CM

## 2018-12-31 DIAGNOSIS — G629 Polyneuropathy, unspecified: Secondary | ICD-10-CM | POA: Diagnosis not present

## 2018-12-31 NOTE — Progress Notes (Signed)
[facility]  PATIENT: Candice Brown DOB: Dec 21, 1931  REFERRING CLINICIAN: Deirdre Pippins / S [place] HISTORY FROM: patient  REASON FOR VISIT: new consult / existing pt   HISTORICAL  CHIEF COMPLAINT:  Chief Complaint  Patient presents with  . Chronic bilateral knee pain    rm 7, New Pt, "my knees buckle, not really pain"    HISTORY OF PRESENT ILLNESS:   UPDATE (12/31/18, VRP): Since last visit, doing about the same. Intermittent lower ext weakness (legs buckle) sometimes earlier in the day, sometimes with activity. Symptoms are intermittent (few seconds). No other alleviating or aggravating factors. No pain. No numbness with events. Has stable peripheral neuropathy affecting toes / feet (numbness and burning; on lyrica).  PRIOR HPI (04/02/17): 85 year old female here for evaluation of lower extremity intermittent weakness.  2-3 months ago patient had episode lasting for 2 hours of intermittent lower extremity weakness.  She felt like her knees were buckling and giving out.  Symptoms resolved.  Next day she had similar symptom for less duration.  She also had similar episode when resting her elbows on a table and felt like her arms were giving out.  Since that time no further symptoms.  No numbness appearing tingling headaches or vision changes.  Patient has diagnosis of idiopathic peripheral neuropathy from 2010 or earlier.   REVIEW OF SYSTEMS: Full 14 system review of systems performed and negative with exception of:  As per HPI.  ALLERGIES: Allergies  Allergen Reactions  . Linzess [Linaclotide] Diarrhea    Increased leg cramps  . Oxycodone Nausea Only     ( made very sick)  . Benzalkonium Chloride Rash  . Neosporin [Neomycin-Polymyxin-Gramicidin] Rash  . Tape Rash    HOME MEDICATIONS: Outpatient Medications Prior to Visit  Medication Sig Dispense Refill  . aspirin 325 MG EC tablet Take 325 mg by mouth daily.    . Calcium Carb-Cholecalciferol  (CALCIUM-VITAMIN D) 600-400 MG-UNIT TABS Take 1 tablet by mouth daily.    . diphenhydramine-acetaminophen (TYLENOL PM) 25-500 MG TABS tablet Take 1 tablet by mouth at bedtime as needed.    . Magnesium 250 MG TABS Take by mouth.    . NON FORMULARY Take 1 tablet by mouth daily as needed. Airborne     . PANTOPRAZOLE SODIUM PO Take 40 mg by mouth daily.     . polycarbophil (FIBERCON) 625 MG tablet Take 2 tablets by mouth every evening     . pregabalin (LYRICA) 150 MG capsule Take 150 mg by mouth daily.      . rosuvastatin (CRESTOR) 10 MG tablet Take 1 tablet by mouth 2 (two) times a week.  0  . UNABLE TO FIND Med Name: OTC for leg cramps     No facility-administered medications prior to visit.     PAST MEDICAL HISTORY: Past Medical History:  Diagnosis Date  . Arthritis   . Colon polyps    hyperplastic  . Diverticulosis   . GERD (gastroesophageal reflux disease)   . Hiatal hernia   . Hyperlipidemia   . IBS (irritable bowel syndrome)   . Internal hemorrhoids   . Lung nodule   . Occipital neuralgia   . Peripheral neuropathy     PAST SURGICAL HISTORY: Past Surgical History:  Procedure Laterality Date  . ABDOMINAL HYSTERECTOMY  1972  . APPENDECTOMY  1959  . CATARACT EXTRACTION Left 03/2014   right eye 06/2016  . CHOLECYSTECTOMY      FAMILY HISTORY: Family History  Problem Relation Age  of Onset  . Heart disease Father   . CVA Father   . Breast cancer Mother 42  . Colon cancer Neg Hx     SOCIAL HISTORY:  Social History   Socioeconomic History  . Marital status: Divorced    Spouse name: Not on file  . Number of children: 3  . Years of education: Masters  . Highest education level: Not on file  Occupational History  . Occupation: retired  Scientific laboratory technician  . Financial resource strain: Not on file  . Food insecurity    Worry: Not on file    Inability: Not on file  . Transportation needs    Medical: Not on file    Non-medical: Not on file  Tobacco Use  . Smoking  status: Former Smoker    Packs/day: 1.00    Types: Cigarettes    Quit date: 10/13/2016    Years since quitting: 2.2  . Smokeless tobacco: Never Used  . Tobacco comment: 12/31/18  nicotene gum, lozenges  Substance and Sexual Activity  . Alcohol use: Yes    Alcohol/week: 0.0 standard drinks    Comment: less than 1/2 glass wine daily  . Drug use: No  . Sexual activity: Not on file  Lifestyle  . Physical activity    Days per week: Not on file    Minutes per session: Not on file  . Stress: Not on file  Relationships  . Social Herbalist on phone: Not on file    Gets together: Not on file    Attends religious service: Not on file    Active member of club or organization: Not on file    Attends meetings of clubs or organizations: Not on file    Relationship status: Not on file  . Intimate partner violence    Fear of current or ex partner: Not on file    Emotionally abused: Not on file    Physically abused: Not on file    Forced sexual activity: Not on file  Other Topics Concern  . Not on file  Social History Narrative   12/31/18 Lives alone   Caffeine- coffee, 1- 1 1/2 cups daily     PHYSICAL EXAM  GENERAL EXAM/CONSTITUTIONAL: Vitals:  Vitals:   12/31/18 0944  BP: 124/82  Pulse: 64  Temp: 97.6 F (36.4 C)  Weight: 123 lb 3.2 oz (55.9 kg)  Height: '5\' 3"'  (1.6 m)   Body mass index is 21.82 kg/m. No exam data present  Patient is in no distress; well developed, nourished and groomed; neck is supple  CARDIOVASCULAR:  Examination of carotid arteries is normal; no carotid bruits  Regular rate and rhythm, no murmurs  Examination of peripheral vascular system by observation and palpation is normal  EYES:  Ophthalmoscopic exam of optic discs and posterior segments is normal; no papilledema or hemorrhages  MUSCULOSKELETAL:  Gait, strength, tone, movements noted in Neurologic exam below  NEUROLOGIC: MENTAL STATUS:  No flowsheet data found.  awake,  alert, oriented to person, place and time  recent and remote memory intact  normal attention and concentration  language fluent, comprehension intact, naming intact,   fund of knowledge appropriate  CRANIAL NERVE:   2nd - no papilledema on fundoscopic exam  2nd, 3rd, 4th, 6th - pupils equal and reactive to light, visual fields full to confrontation, extraocular muscles intact, no nystagmus  5th - facial sensation symmetric  7th - facial strength symmetric  8th - hearing intact  9th -  palate elevates symmetrically, uvula midline  11th - shoulder shrug symmetric  12th - tongue protrusion midline  MOTOR:   normal bulk and tone, full strength in the BUE, BLE  SENSORY:   normal and symmetric to light touch; DECR PP AND VIB AT TOES  COORDINATION:   finger-nose-finger, fine finger movements normal  REFLEXES:   deep tendon reflexes TRACE and symmetric; ABSENT AT ANKLES  GAIT/STATION:   narrow based gait; able to walk on toes, heels; romberg is negative    DIAGNOSTIC DATA (LABS, IMAGING, TESTING) - I reviewed patient records, labs, notes, testing and imaging myself where available.  Lab Results  Component Value Date   WBC 6.4 11/18/2018   HGB 12.8 11/18/2018   HCT 38.2 11/18/2018   MCV 97.7 11/18/2018   PLT 333 11/18/2018      Component Value Date/Time   NA 140 11/18/2018 1035   K 4.4 11/18/2018 1035   CL 102 11/18/2018 1035   CO2 31 11/18/2018 1035   GLUCOSE 84 11/18/2018 1035   BUN 21 11/18/2018 1035   CREATININE 0.86 11/18/2018 1035   CALCIUM 10.4 11/18/2018 1035   PROT 6.7 11/18/2018 1035   AST 20 11/18/2018 1035   ALT 10 11/18/2018 1035   BILITOT 0.3 11/18/2018 1035   GFRNONAA 62 11/18/2018 1035   GFRAA 71 11/18/2018 1035   No results found for: CHOL, HDL, LDLCALC, LDLDIRECT, TRIG, CHOLHDL No results found for: HGBA1C No results found for: VITAMINB12 No results found for: TSH   10/08/15 MRI lumbar spine 1. At L5-S1 there is a mild  broad-based disc bulge. Severe bilateral facet arthropathy. Moderate left foraminal stenosis. Mild grade 1 anterolisthesis of L5 on S1 secondary to facet disease. 2. Mild lumbar spine spondylosis as described above.     ASSESSMENT AND PLAN  85 y.o. year old female here with history of idiopathic peripheral neuropathy, now with intermittent episodes of muscle weakness affecting lower extremities greater than upper extremities (since 2018) Neurologic examination today notable only for peripheral neuropathy affecting lower ext.      Dx: idiopathic peripheral neuropathy + lumbar radiculopathy   1. Neuropathy   2. Transient weakness of lower extremity      PLAN:  INTERMITTENT LOWER EXT WEAKNESS - check neuropathy labs and myopathy labs - continue PT exercises to increase lower extremity muscle strength   Orders Placed This Encounter  Procedures  . Multiple Myeloma Panel (SPEP&IFE w/QIG)  . ANA,IFA RA Diag Pnl w/rflx Tit/Patn  . Vitamin B12  . Hemoglobin A1c  . TSH  . CK  . Aldolase  . Sedimentation Rate  . C-reactive Protein   Return for pending if symptoms worsen or fail to improve.    Penni Bombard, MD 07/68/0881, 10:31 AM Certified in Neurology, Neurophysiology and Neuroimaging  [facility] [address], [place], [place] [address] [phone]

## 2019-01-05 LAB — ANA,IFA RA DIAG PNL W/RFLX TIT/PATN
ANA Titer 1: NEGATIVE
Cyclic Citrullin Peptide Ab: 3 units (ref 0–19)
Rheumatoid fact SerPl-aCnc: <10 IU/mL (ref 0.0–13.9)

## 2019-01-05 LAB — HEMOGLOBIN A1C
Est. average glucose Bld gHb Est-mCnc: 111 mg/dL
Hgb A1c MFr Bld: 5.5 % (ref 4.8–5.6)

## 2019-01-05 LAB — MULTIPLE MYELOMA PANEL, SERUM
Albumin SerPl Elph-Mcnc: 4.4 g/dL (ref 2.9–4.4)
Albumin/Glob SerPl: 2 — ABNORMAL HIGH (ref 0.7–1.7)
Alpha 1: 0.2 g/dL (ref 0.0–0.4)
Alpha2 Glob SerPl Elph-Mcnc: 0.6 g/dL (ref 0.4–1.0)
B-Globulin SerPl Elph-Mcnc: 0.8 g/dL (ref 0.7–1.3)
Gamma Glob SerPl Elph-Mcnc: 0.7 g/dL (ref 0.4–1.8)
Globulin, Total: 2.3 g/dL (ref 2.2–3.9)
IgA/Immunoglobulin A, Serum: 178 mg/dL (ref 64–422)
IgG (Immunoglobin G), Serum: 874 mg/dL (ref 586–1602)
IgM (Immunoglobulin M), Srm: 229 mg/dL — ABNORMAL HIGH (ref 26–217)
Total Protein: 6.7 g/dL (ref 6.0–8.5)

## 2019-01-05 LAB — SEDIMENTATION RATE: Sed Rate: 2 mm/hr (ref 0–40)

## 2019-01-05 LAB — CK: Total CK: 86 U/L (ref 26–161)

## 2019-01-05 LAB — C-REACTIVE PROTEIN: CRP: 2 mg/L (ref 0–10)

## 2019-01-05 LAB — TSH: TSH: 1.29 u[IU]/mL (ref 0.450–4.500)

## 2019-01-05 LAB — ALDOLASE: Aldolase: 4.2 U/L (ref 3.3–10.3)

## 2019-01-05 LAB — VITAMIN B12: Vitamin B-12: 434 pg/mL (ref 232–1245)

## 2019-01-12 ENCOUNTER — Other Ambulatory Visit: Payer: Self-pay

## 2019-01-12 DIAGNOSIS — Z20822 Contact with and (suspected) exposure to covid-19: Secondary | ICD-10-CM

## 2019-01-13 LAB — NOVEL CORONAVIRUS, NAA: SARS-CoV-2, NAA: NOT DETECTED

## 2019-01-20 ENCOUNTER — Other Ambulatory Visit: Payer: Self-pay

## 2019-01-20 DIAGNOSIS — Z20822 Contact with and (suspected) exposure to covid-19: Secondary | ICD-10-CM

## 2019-01-22 LAB — NOVEL CORONAVIRUS, NAA: SARS-CoV-2, NAA: NOT DETECTED

## 2019-02-20 ENCOUNTER — Other Ambulatory Visit: Payer: Self-pay | Admitting: Internal Medicine

## 2019-02-20 DIAGNOSIS — Z1231 Encounter for screening mammogram for malignant neoplasm of breast: Secondary | ICD-10-CM

## 2019-03-12 ENCOUNTER — Ambulatory Visit: Payer: Medicare Other

## 2019-03-21 ENCOUNTER — Ambulatory Visit: Payer: Medicare Other

## 2019-03-23 ENCOUNTER — Ambulatory Visit: Payer: Medicare PPO

## 2019-03-23 ENCOUNTER — Ambulatory Visit: Payer: Medicare Other

## 2019-03-31 ENCOUNTER — Other Ambulatory Visit: Payer: Self-pay

## 2019-03-31 ENCOUNTER — Ambulatory Visit
Admission: RE | Admit: 2019-03-31 | Discharge: 2019-03-31 | Disposition: A | Discharge status: Home / Self Care | Payer: Medicare Other | Source: Ambulatory Visit | Attending: Internal Medicine | Admitting: Internal Medicine

## 2019-03-31 DIAGNOSIS — Z1231 Encounter for screening mammogram for malignant neoplasm of breast: Secondary | ICD-10-CM

## 2019-11-04 DIAGNOSIS — L570 Actinic keratosis: Secondary | ICD-10-CM | POA: Diagnosis not present

## 2019-11-04 DIAGNOSIS — Z85828 Personal history of other malignant neoplasm of skin: Secondary | ICD-10-CM | POA: Diagnosis not present

## 2019-11-04 DIAGNOSIS — L821 Other seborrheic keratosis: Secondary | ICD-10-CM | POA: Diagnosis not present

## 2019-11-04 DIAGNOSIS — D692 Other nonthrombocytopenic purpura: Secondary | ICD-10-CM | POA: Diagnosis not present

## 2019-11-04 DIAGNOSIS — B351 Tinea unguium: Secondary | ICD-10-CM | POA: Diagnosis not present

## 2019-11-04 DIAGNOSIS — D1801 Hemangioma of skin and subcutaneous tissue: Secondary | ICD-10-CM | POA: Diagnosis not present

## 2019-11-04 DIAGNOSIS — L814 Other melanin hyperpigmentation: Secondary | ICD-10-CM | POA: Diagnosis not present

## 2019-11-20 DIAGNOSIS — S0501XA Injury of conjunctiva and corneal abrasion without foreign body, right eye, initial encounter: Secondary | ICD-10-CM | POA: Diagnosis not present

## 2019-11-20 DIAGNOSIS — H16101 Unspecified superficial keratitis, right eye: Secondary | ICD-10-CM | POA: Diagnosis not present

## 2019-11-20 DIAGNOSIS — H5711 Ocular pain, right eye: Secondary | ICD-10-CM | POA: Diagnosis not present

## 2019-11-20 DIAGNOSIS — H04121 Dry eye syndrome of right lacrimal gland: Secondary | ICD-10-CM | POA: Diagnosis not present

## 2019-11-30 DIAGNOSIS — R41841 Cognitive communication deficit: Secondary | ICD-10-CM | POA: Diagnosis not present

## 2019-12-03 DIAGNOSIS — R41841 Cognitive communication deficit: Secondary | ICD-10-CM | POA: Diagnosis not present

## 2019-12-08 DIAGNOSIS — R41841 Cognitive communication deficit: Secondary | ICD-10-CM | POA: Diagnosis not present

## 2019-12-10 DIAGNOSIS — R41841 Cognitive communication deficit: Secondary | ICD-10-CM | POA: Diagnosis not present

## 2019-12-17 DIAGNOSIS — R41841 Cognitive communication deficit: Secondary | ICD-10-CM | POA: Diagnosis not present

## 2019-12-22 DIAGNOSIS — R41841 Cognitive communication deficit: Secondary | ICD-10-CM | POA: Diagnosis not present

## 2019-12-24 DIAGNOSIS — R41841 Cognitive communication deficit: Secondary | ICD-10-CM | POA: Diagnosis not present

## 2019-12-29 DIAGNOSIS — R41841 Cognitive communication deficit: Secondary | ICD-10-CM | POA: Diagnosis not present

## 2019-12-31 DIAGNOSIS — R41841 Cognitive communication deficit: Secondary | ICD-10-CM | POA: Diagnosis not present

## 2020-01-18 ENCOUNTER — Ambulatory Visit: Payer: Medicare PPO | Admitting: Surgery

## 2020-01-18 ENCOUNTER — Other Ambulatory Visit (HOSPITAL_COMMUNITY): Payer: Medicare PPO

## 2020-02-09 ENCOUNTER — Other Ambulatory Visit: Payer: Self-pay | Admitting: Internal Medicine

## 2020-02-09 DIAGNOSIS — K5730 Diverticulosis of large intestine without perforation or abscess without bleeding: Secondary | ICD-10-CM

## 2020-02-10 ENCOUNTER — Ambulatory Visit
Admission: RE | Admit: 2020-02-10 | Discharge: 2020-02-10 | Disposition: A | Discharge status: Home / Self Care | Payer: Medicare PPO | Source: Ambulatory Visit | Attending: Internal Medicine | Admitting: Internal Medicine

## 2020-02-10 DIAGNOSIS — K5730 Diverticulosis of large intestine without perforation or abscess without bleeding: Secondary | ICD-10-CM

## 2020-02-10 DIAGNOSIS — R197 Diarrhea, unspecified: Secondary | ICD-10-CM | POA: Diagnosis not present

## 2020-02-10 DIAGNOSIS — N281 Cyst of kidney, acquired: Secondary | ICD-10-CM | POA: Diagnosis not present

## 2020-02-10 DIAGNOSIS — M419 Scoliosis, unspecified: Secondary | ICD-10-CM | POA: Diagnosis not present

## 2020-02-10 MED ORDER — IOPAMIDOL (ISOVUE-300) INJECTION 61%
100.0000 mL | Freq: Once | INTRAVENOUS | Status: AC | PRN
  Administered 2020-02-10: 100 mL via INTRAVENOUS

## 2020-02-15 DIAGNOSIS — E785 Hyperlipidemia, unspecified: Secondary | ICD-10-CM | POA: Diagnosis not present

## 2020-02-15 DIAGNOSIS — I10 Essential (primary) hypertension: Secondary | ICD-10-CM | POA: Diagnosis not present

## 2020-02-16 ENCOUNTER — Other Ambulatory Visit: Payer: Self-pay | Admitting: Internal Medicine

## 2020-02-16 DIAGNOSIS — Z1231 Encounter for screening mammogram for malignant neoplasm of breast: Secondary | ICD-10-CM

## 2020-02-22 DIAGNOSIS — E785 Hyperlipidemia, unspecified: Secondary | ICD-10-CM | POA: Diagnosis not present

## 2020-02-22 DIAGNOSIS — Z1331 Encounter for screening for depression: Secondary | ICD-10-CM | POA: Diagnosis not present

## 2020-02-22 DIAGNOSIS — I720 Aneurysm of carotid artery: Secondary | ICD-10-CM | POA: Diagnosis not present

## 2020-02-22 DIAGNOSIS — F329 Major depressive disorder, single episode, unspecified: Secondary | ICD-10-CM | POA: Diagnosis not present

## 2020-02-22 DIAGNOSIS — R197 Diarrhea, unspecified: Secondary | ICD-10-CM | POA: Diagnosis not present

## 2020-02-22 DIAGNOSIS — R2681 Unsteadiness on feet: Secondary | ICD-10-CM | POA: Diagnosis not present

## 2020-02-22 DIAGNOSIS — I714 Abdominal aortic aneurysm, without rupture: Secondary | ICD-10-CM | POA: Diagnosis not present

## 2020-02-22 DIAGNOSIS — I10 Essential (primary) hypertension: Secondary | ICD-10-CM | POA: Diagnosis not present

## 2020-02-22 DIAGNOSIS — Z1152 Encounter for screening for COVID-19: Secondary | ICD-10-CM | POA: Diagnosis not present

## 2020-02-22 DIAGNOSIS — Z0000 Encounter for general adult medical examination without abnormal findings: Secondary | ICD-10-CM | POA: Diagnosis not present

## 2020-02-22 DIAGNOSIS — G629 Polyneuropathy, unspecified: Secondary | ICD-10-CM | POA: Diagnosis not present

## 2020-02-22 DIAGNOSIS — Z1339 Encounter for screening examination for other mental health and behavioral disorders: Secondary | ICD-10-CM | POA: Diagnosis not present

## 2020-02-22 DIAGNOSIS — R82998 Other abnormal findings in urine: Secondary | ICD-10-CM | POA: Diagnosis not present

## 2020-02-24 ENCOUNTER — Other Ambulatory Visit: Payer: Self-pay | Admitting: *Deleted

## 2020-02-24 DIAGNOSIS — I714 Abdominal aortic aneurysm, without rupture, unspecified: Secondary | ICD-10-CM

## 2020-03-01 DIAGNOSIS — Z1212 Encounter for screening for malignant neoplasm of rectum: Secondary | ICD-10-CM | POA: Diagnosis not present

## 2020-03-07 ENCOUNTER — Other Ambulatory Visit: Payer: Self-pay

## 2020-03-07 ENCOUNTER — Ambulatory Visit: Payer: Medicare PPO | Admitting: Surgery

## 2020-03-07 ENCOUNTER — Ambulatory Visit (HOSPITAL_COMMUNITY)
Admission: RE | Admit: 2020-03-07 | Discharge: 2020-03-07 | Disposition: A | Discharge status: Home / Self Care | Payer: Medicare PPO | Source: Ambulatory Visit | Attending: Surgery | Admitting: Surgery

## 2020-03-07 ENCOUNTER — Encounter: Payer: Self-pay | Admitting: Surgery

## 2020-03-07 VITALS — BP 148/77 | HR 55 | Temp 97.4°F | Resp 14 | Ht 63.5 in | Wt 124.0 lb

## 2020-03-07 DIAGNOSIS — I714 Abdominal aortic aneurysm, without rupture, unspecified: Secondary | ICD-10-CM

## 2020-03-07 NOTE — Progress Notes (Signed)
[facility]  Patient name: Candice Brown MRN: [id number] DOB: [birth year] Sex: female   REASON FOR VISIT:    Follow up  HISOTRY OF PRESENT ILLNESS:    Candice Brown is a 87 y.o. female who I saw in 2019 for 2.8 cm infrarenal abdominal aortic aneurysm that was detected during a work-up for lower back disease.  She comes back today for follow-up.  She has denied any abdominal pain or new back pain.  The patient has a history of a carotid aneurysm approximately 17 years ago it was detected when she had a small stroke. It is followed at [facility] by neurology. No intervention has been performed.   She sees them every 3 or 4 years.  She does take an aspirin for antiplatelet therapy. She takes Crestor for hypercholesterolemia.  PAST MEDICAL HISTORY:   Past Medical History:  Diagnosis Date  . Arthritis   . Colon polyps    hyperplastic  . Diverticulosis   . GERD (gastroesophageal reflux disease)   . Hiatal hernia   . Hyperlipidemia   . IBS (irritable bowel syndrome)   . Internal hemorrhoids   . Lung nodule   . Occipital neuralgia   . Peripheral neuropathy      FAMILY HISTORY:   Family History  Problem Relation Age of Onset  . Heart disease Father   . CVA Father   . Breast cancer Mother 40  . Colon cancer Neg Hx     SOCIAL HISTORY:   Social History   Tobacco Use  . Smoking status: Former Smoker    Packs/day: 1.00    Types: Cigarettes    Quit date: 10/13/2016    Years since quitting: 3.4  . Smokeless tobacco: Never Used  . Tobacco comment: 12/31/18  nicotene gum, lozenges  Substance Use Topics  . Alcohol use: Yes    Alcohol/week: 0.0 standard drinks    Comment: less than 1/2 glass wine daily     ALLERGIES:   Allergies  Allergen Reactions  . Linzess [Linaclotide] Diarrhea    Increased leg cramps  . Oxycodone Nausea Only     ( made very sick)  . Benzalkonium Chloride Rash  . Neosporin  [Neomycin-Polymyxin-Gramicidin] Rash  . Tape Rash     CURRENT MEDICATIONS:   Current Outpatient Medications  Medication Sig Dispense Refill  . aspirin 325 MG EC tablet Take 325 mg by mouth daily.    . Calcium Carb-Cholecalciferol (CALCIUM-VITAMIN D) 600-400 MG-UNIT TABS Take 1 tablet by mouth daily.    . diphenhydramine-acetaminophen (TYLENOL PM) 25-500 MG TABS tablet Take 1 tablet by mouth at bedtime as needed.    . Magnesium 250 MG TABS Take by mouth.    . NON FORMULARY Take 1 tablet by mouth daily as needed. Airborne    . PANTOPRAZOLE SODIUM PO Take 40 mg by mouth daily.     . polycarbophil (FIBERCON) 625 MG tablet Take 2 tablets by mouth every evening    . pregabalin (LYRICA) 150 MG capsule Take 150 mg by mouth daily.    . rosuvastatin (CRESTOR) 10 MG tablet Take 1 tablet by mouth 2 (two) times a week.  0  . UNABLE TO FIND Med Name: OTC for leg cramps     No current facility-administered medications for this visit.    REVIEW OF SYSTEMS:   [X]  denotes positive finding, [ ]  denotes negative finding Cardiac  Comments:  Chest pain or chest pressure:    Shortness of breath  upon exertion:    Short of breath when lying flat:    Irregular heart rhythm:        Vascular    Pain in calf, thigh, or hip brought on by ambulation:    Pain in feet at night that wakes you up from your sleep:     Blood clot in your veins:    Leg swelling:         Pulmonary    Oxygen at home:    Productive cough:     Wheezing:         Neurologic    Sudden weakness in arms or legs:     Sudden numbness in arms or legs:     Sudden onset of difficulty speaking or slurred speech:    Temporary loss of vision in one eye:     Problems with dizziness:         Gastrointestinal    Blood in stool:     Vomited blood:         Genitourinary    Burning when urinating:     Blood in urine:        Psychiatric    Major depression:         Hematologic    Bleeding problems:    Problems with blood clotting  too easily:        Skin    Rashes or ulcers:        Constitutional    Fever or chills:      PHYSICAL EXAM:   Vitals:   03/07/20 0959  BP: (!) 148/77  Pulse: (!) 55  Resp: 14  Temp: (!) 97.4 F (36.3 C)  TempSrc: Temporal  SpO2: 96%  Weight: 124 lb (56.2 kg)  Height: 5' 3.5" (1.613 m)    GENERAL: The patient is a well-nourished female, in no acute distress. The vital signs are documented above. CARDIAC: There is a regular rate and rhythm.  VASCULAR: Palpable left dorsalis pedis pulse.  I could not palpate a right PULMONARY: Non-labored respirations ABDOMEN: Soft and non-tender MUSCULOSKELETAL: There are no major deformities or cyanosis. NEUROLOGIC: No focal weakness or paresthesias are detected. SKIN: There are no ulcers or rashes noted. PSYCHIATRIC: The patient has a normal affect.  STUDIES:   I have reviewed the following: Abdominal Aorta: There is evidence of abnormal dilatation of the distal  Abdominal aorta. The largest aortic measurement is 2.8 cm. The largest  aortic diameter remains essentially unchanged compared to prior exam.  Previous diameter measurement was 2.8 cm  obtained on 11/18/2017.    MEDICAL ISSUES:   AAA: No change in maximum aortic diameter of 2.8 cm.  She will follow-up in 3 years with a repeat ultrasound    Leia Alf, MD, FACS [facility] [phone] Pager [phone]

## 2020-03-09 DIAGNOSIS — M25561 Pain in right knee: Secondary | ICD-10-CM | POA: Diagnosis not present

## 2020-03-09 DIAGNOSIS — M25562 Pain in left knee: Secondary | ICD-10-CM | POA: Diagnosis not present

## 2020-03-16 DIAGNOSIS — M25562 Pain in left knee: Secondary | ICD-10-CM | POA: Diagnosis not present

## 2020-03-16 DIAGNOSIS — M25561 Pain in right knee: Secondary | ICD-10-CM | POA: Diagnosis not present

## 2020-03-18 DIAGNOSIS — M25561 Pain in right knee: Secondary | ICD-10-CM | POA: Diagnosis not present

## 2020-03-18 DIAGNOSIS — M25562 Pain in left knee: Secondary | ICD-10-CM | POA: Diagnosis not present

## 2020-03-22 DIAGNOSIS — M25561 Pain in right knee: Secondary | ICD-10-CM | POA: Diagnosis not present

## 2020-03-22 DIAGNOSIS — M25562 Pain in left knee: Secondary | ICD-10-CM | POA: Diagnosis not present

## 2020-03-24 DIAGNOSIS — M25562 Pain in left knee: Secondary | ICD-10-CM | POA: Diagnosis not present

## 2020-03-24 DIAGNOSIS — M25561 Pain in right knee: Secondary | ICD-10-CM | POA: Diagnosis not present

## 2020-03-29 DIAGNOSIS — M25561 Pain in right knee: Secondary | ICD-10-CM | POA: Diagnosis not present

## 2020-03-29 DIAGNOSIS — M25562 Pain in left knee: Secondary | ICD-10-CM | POA: Diagnosis not present

## 2020-03-31 ENCOUNTER — Other Ambulatory Visit: Payer: Self-pay

## 2020-03-31 ENCOUNTER — Ambulatory Visit
Admission: RE | Admit: 2020-03-31 | Discharge: 2020-03-31 | Disposition: A | Discharge status: Home / Self Care | Payer: Medicare PPO | Source: Ambulatory Visit | Attending: Internal Medicine | Admitting: Internal Medicine

## 2020-03-31 DIAGNOSIS — Z1231 Encounter for screening mammogram for malignant neoplasm of breast: Secondary | ICD-10-CM

## 2020-04-01 DIAGNOSIS — M25561 Pain in right knee: Secondary | ICD-10-CM | POA: Diagnosis not present

## 2020-04-01 DIAGNOSIS — M25562 Pain in left knee: Secondary | ICD-10-CM | POA: Diagnosis not present

## 2020-04-05 DIAGNOSIS — M25562 Pain in left knee: Secondary | ICD-10-CM | POA: Diagnosis not present

## 2020-04-05 DIAGNOSIS — M25561 Pain in right knee: Secondary | ICD-10-CM | POA: Diagnosis not present

## 2020-04-15 DIAGNOSIS — M25561 Pain in right knee: Secondary | ICD-10-CM | POA: Diagnosis not present

## 2020-04-15 DIAGNOSIS — M25562 Pain in left knee: Secondary | ICD-10-CM | POA: Diagnosis not present

## 2020-05-16 ENCOUNTER — Encounter: Payer: Self-pay | Admitting: Gastroenterology

## 2020-05-18 ENCOUNTER — Other Ambulatory Visit: Payer: Self-pay

## 2020-05-18 ENCOUNTER — Ambulatory Visit: Payer: Medicare PPO | Admitting: Gastroenterology

## 2020-05-18 ENCOUNTER — Encounter: Payer: Self-pay | Admitting: Gastroenterology

## 2020-05-18 VITALS — BP 128/60 | HR 64 | Ht 62.25 in | Wt 121.1 lb

## 2020-05-18 DIAGNOSIS — R194 Change in bowel habit: Secondary | ICD-10-CM | POA: Diagnosis not present

## 2020-05-18 DIAGNOSIS — K529 Noninfective gastroenteritis and colitis, unspecified: Secondary | ICD-10-CM | POA: Insufficient documentation

## 2020-05-18 MED ORDER — PLENVU 140 G PO SOLR: 140.0000 g | ORAL | 0 refills | Status: DC

## 2020-05-18 NOTE — Patient Instructions (Addendum)
If you are age 85 or older, your body mass index should be between 23-30. Your Body mass index is 21.98 kg/m. If this is out of the aforementioned range listed, please consider follow up with your Primary Care Provider.  If you are age 50 or younger, your body mass index should be between 19-25. Your Body mass index is 21.98 kg/m. If this is out of the aformentioned range listed, please consider follow up with your Primary Care Provider.   You have been scheduled for a colonoscopy. Please follow written instructions given to you at your visit today.  Please pick up your prep supplies at the pharmacy within the next 1-3 days. If you use inhalers (even only as needed), please bring them with you on the day of your procedure.  STOP taking the magnesium supplement.  It was a pleasure to see you today!  Alonza Bogus, PA-C

## 2020-05-18 NOTE — Progress Notes (Signed)
05/18/2020 Candice Brown [id number] October 17, 1931   HISTORY OF PRESENT ILLNESS: This is an 87 year old female who is a patient of Dr. Blanch Media.  She presents here today at the request of her PCP, Dr. Reynaldo Minium, for evaluation regarding diarrhea.  She is here with her daughter.  They tell me that she has been having diarrhea issues for probably greater than a year, but worsening over the past 6 months or so.  She says that she does not have diarrhea every day but about 2 to 3 days out of the week it significantly impacts her ability to leave the house or even really get far from the bathroom at times.  She says that the days that she has diarrhea she will have anywhere from 3-6 bowel movements a day with a lot of urgency.  She denies seeing any blood in her stools.  She has some lower abdominal cramping when she is going to have diarrhea, but no persistent pain.  She says that historically she's had a diagnosis of IBS, but she had always had problems with constipation.  Upon review of her medication list she is on magnesium 250 mg daily.  She does not have a gallbladder.  She says that she does use Imodium and sometimes it does not work and sometimes it works too much.  Her last colonoscopy was in October 2008 at which time she had diverticulosis and some small hyperplastic polyps removed.  Also had angiodysplastic lesions of the cecum that were nonbleeding.   Past Medical History:  Diagnosis Date  . Anemia   . Arthritis   . Colon polyps    hyperplastic  . Diverticulosis   . GERD (gastroesophageal reflux disease)   . Hiatal hernia   . Hyperlipidemia   . IBS (irritable bowel syndrome)   . Internal hemorrhoids   . Lung nodule   . Occipital neuralgia   . Peripheral neuropathy    Past Surgical History:  Procedure Laterality Date  . ABDOMINAL HYSTERECTOMY  1972  . APPENDECTOMY  1959  . CATARACT EXTRACTION Bilateral    left 03/2014, right eye 06/2016  . CHOLECYSTECTOMY      reports that she  quit smoking about 3 years ago. Her smoking use included cigarettes. She smoked 1.00 pack per day. She has never used smokeless tobacco. She reports current alcohol use. She reports that she does not use drugs. family history includes Breast cancer (age of onset: 83) in her mother; CVA in her father; Heart attack in her paternal grandfather; Heart attack (age of onset: 57) in her father; Heart disease in her father. Allergies  Allergen Reactions  . Linzess [Linaclotide] Diarrhea    Increased leg cramps  . Oxycodone Nausea Only     ( made very sick)  . Benzalkonium Chloride Rash  . Neosporin [Neomycin-Polymyxin-Gramicidin] Rash  . Tape Rash      Outpatient Encounter Medications as of 05/18/2020  Medication Sig  . Acetaminophen (TYLENOL) 325 MG CAPS Take 1 tablet by mouth at bedtime.  Marland Kitchen aspirin 325 MG EC tablet Take 325 mg by mouth daily.  . Calcium Carb-Cholecalciferol (CALCIUM-VITAMIN D) 600-400 MG-UNIT TABS Take 1 tablet by mouth daily.  . diphenhydramine-acetaminophen (TYLENOL PM) 25-500 MG TABS tablet Take 1 tablet by mouth at bedtime as needed.  . Magnesium 250 MG TABS Take by mouth.  . NON FORMULARY Take 1 tablet by mouth daily as needed. Airborne  . PANTOPRAZOLE SODIUM PO Take 40 mg by mouth daily.   Marland Kitchen  polycarbophil (FIBERCON) 625 MG tablet Take 2 tablets by mouth every evening  . pregabalin (LYRICA) 150 MG capsule Take 150 mg by mouth daily.  . rosuvastatin (CRESTOR) 10 MG tablet Take 1 tablet by mouth 2 (two) times a week.  . sertraline (ZOLOFT) 50 MG tablet Take 1 tablet by mouth daily.  Marland Kitchen UNABLE TO FIND Med Name: OTC for leg cramps   No facility-administered encounter medications on file as of 05/18/2020.     REVIEW OF SYSTEMS  : All other systems reviewed and negative except where noted in the History of Present Illness.   PHYSICAL EXAM: BP 128/60 (BP Location: Left Arm, Patient Position: Sitting, Cuff Size: Normal)   Pulse 64   Ht 5' 2.25" (1.581 m) Comment: height  measured without shoes  Wt 121 lb 2 oz (54.9 kg)   BMI 21.98 kg/m  General: Well developed white female in no acute distress Head: Normocephalic and atraumatic Eyes:  Sclerae anicteric, conjunctiva pink. Ears: Normal auditory acuity Lungs: Clear throughout to auscultation; no W/R/R. Heart: Regular rate and rhythm; no M/R/G. Abdomen: Soft, non-distended.  BS present.  Non-tender. Musculoskeletal: Symmetrical with no gross deformities  Skin: No lesions on visible extremities Extremities: No edema  Neurological: Alert oriented x 4, grossly non-focal Psychological:  Alert and cooperative. Normal mood and affect  ASSESSMENT AND PLAN: *87 year old female with complaints of diarrhea for the past year or so, probably worsening over the past 6 months.  Diarrhea is not daily, but probably 2 to 3 days out of the week that it is significantly impacting her life and ability to leave her house.  First of all I noticed that she takes magnesium 250 mg daily.  I have asked her to discontinue this and see how much improvement she gets over the next week or 2.  If there is not dramatic improvement then she likely will need colonoscopy to rule out microscopic colitis, etc.  We will go ahead and schedule that with Dr. Henrene Pastor and if she does have significant improvement in her symptoms then she could always cancel the procedure.  The risks, benefits, and alternatives to colonoscopy were discussed with the patient and she consents to proceed.  ? Component of bile salt related diarrhea as well as she does not have a gallbladder, but historically always had issues with constipation.   CC:  Burnard Bunting, MD

## 2020-05-20 NOTE — Progress Notes (Signed)
Noted  

## 2020-05-30 DIAGNOSIS — H52203 Unspecified astigmatism, bilateral: Secondary | ICD-10-CM | POA: Diagnosis not present

## 2020-05-30 DIAGNOSIS — H16103 Unspecified superficial keratitis, bilateral: Secondary | ICD-10-CM | POA: Diagnosis not present

## 2020-05-30 DIAGNOSIS — H04123 Dry eye syndrome of bilateral lacrimal glands: Secondary | ICD-10-CM | POA: Diagnosis not present

## 2020-05-30 DIAGNOSIS — Z961 Presence of intraocular lens: Secondary | ICD-10-CM | POA: Diagnosis not present

## 2020-06-27 ENCOUNTER — Telehealth: Payer: Self-pay | Admitting: Gastroenterology

## 2020-06-27 NOTE — Telephone Encounter (Signed)
The pt states she stopped magnesium and the diarrhea got better for a few days.  The diarrhea has returned but not quite as bad-2-3 days a week.  She would like to know if she should reschedule colon.  Please advise

## 2020-06-28 NOTE — Telephone Encounter (Signed)
I think that would be the next step since it has continued to be an issue.

## 2020-06-28 NOTE — Telephone Encounter (Signed)
The pt has been advised that she needs to have her colon rescheduled.  She will call back to set that up at her convenience.

## 2020-07-26 ENCOUNTER — Encounter: Payer: Medicare PPO | Admitting: Internal Medicine

## 2020-08-24 ENCOUNTER — Telehealth: Payer: Self-pay | Admitting: Gastroenterology

## 2020-08-24 NOTE — Telephone Encounter (Signed)
Inbound call from patient requesting a call please.  Has questions about colonoscopy but did not specify further.  Please advise.

## 2020-08-24 NOTE — Telephone Encounter (Signed)
Left message on machine to call back  

## 2020-08-25 NOTE — Telephone Encounter (Signed)
The pt wanted to reschedule her colon to a later date.  She states " I have something planned for the day after".  The appt has been rescheduled to 9/12 per pt preference.  Pre-visit has remained the same.

## 2020-09-05 DIAGNOSIS — I10 Essential (primary) hypertension: Secondary | ICD-10-CM | POA: Diagnosis not present

## 2020-09-05 DIAGNOSIS — I720 Aneurysm of carotid artery: Secondary | ICD-10-CM | POA: Diagnosis not present

## 2020-09-05 DIAGNOSIS — F329 Major depressive disorder, single episode, unspecified: Secondary | ICD-10-CM | POA: Diagnosis not present

## 2020-09-05 DIAGNOSIS — K5730 Diverticulosis of large intestine without perforation or abscess without bleeding: Secondary | ICD-10-CM | POA: Diagnosis not present

## 2020-09-07 ENCOUNTER — Telehealth: Payer: Self-pay

## 2020-09-07 NOTE — Telephone Encounter (Signed)
She is having colonoscopy to evaluate diarrhea.  This is not a routine screening situation.  Keep plans for previsit and colonoscopy as already scheduled.  Thanks

## 2020-09-07 NOTE — Telephone Encounter (Signed)
Dr Henrene Pastor   Pt is 87 yrs old  PV is 7/28  Procedure 9/12  Last seen in office 05/2020 with Janett Billow  Do you want to cancel, OV or keep as is?

## 2020-09-08 ENCOUNTER — Ambulatory Visit (AMBULATORY_SURGERY_CENTER): Payer: Medicare PPO | Admitting: *Deleted

## 2020-09-08 DIAGNOSIS — K529 Noninfective gastroenteritis and colitis, unspecified: Secondary | ICD-10-CM

## 2020-09-08 DIAGNOSIS — R194 Change in bowel habit: Secondary | ICD-10-CM

## 2020-09-08 MED ORDER — NA SULFATE-K SULFATE-MG SULF 17.5-3.13-1.6 GM/177ML PO SOLN: 1.0000 | Freq: Once | ORAL | 0 refills | Status: AC

## 2020-09-08 NOTE — Progress Notes (Signed)
Virtual pre visit completed. Instructions mailed.   No egg or soy allergy known to patient  No issues with past sedation with any surgeries or procedures Patient denies ever being told they had issues or difficulty with intubation  No FH of Malignant Hyperthermia No diet pills per patient No home 02 use per patient  No blood thinners per patient  Pt denies issues with constipation  No A fib or A flutter  EMMI video to pt or via [place] 19 guidelines implemented in PV today with Pt and RN  Pt is fully vaccinated  for Covid   Discussed with pt there will be an out-of-pocket cost for prep and that varies from $0 to 70 dollars   Due to the COVID-19 pandemic we are asking patients to follow certain guidelines.  Pt aware of COVID protocols and [facility] guidelines

## 2020-09-22 ENCOUNTER — Encounter: Payer: Medicare PPO | Admitting: Internal Medicine

## 2020-10-18 ENCOUNTER — Encounter: Payer: Self-pay | Admitting: Internal Medicine

## 2020-10-24 ENCOUNTER — Other Ambulatory Visit: Payer: Self-pay

## 2020-10-24 ENCOUNTER — Encounter: Payer: Self-pay | Admitting: Internal Medicine

## 2020-10-24 ENCOUNTER — Ambulatory Visit (AMBULATORY_SURGERY_CENTER): Payer: Medicare Other | Admitting: Internal Medicine

## 2020-10-24 VITALS — BP 136/44 | HR 53 | Temp 98.0°F | Resp 13 | Ht 62.25 in | Wt 118.0 lb

## 2020-10-24 DIAGNOSIS — D125 Benign neoplasm of sigmoid colon: Secondary | ICD-10-CM | POA: Diagnosis not present

## 2020-10-24 DIAGNOSIS — D122 Benign neoplasm of ascending colon: Secondary | ICD-10-CM

## 2020-10-24 DIAGNOSIS — R194 Change in bowel habit: Secondary | ICD-10-CM | POA: Diagnosis not present

## 2020-10-24 DIAGNOSIS — K529 Noninfective gastroenteritis and colitis, unspecified: Secondary | ICD-10-CM

## 2020-10-24 DIAGNOSIS — K6389 Other specified diseases of intestine: Secondary | ICD-10-CM

## 2020-10-24 DIAGNOSIS — K5730 Diverticulosis of large intestine without perforation or abscess without bleeding: Secondary | ICD-10-CM | POA: Diagnosis not present

## 2020-10-24 MED ORDER — SODIUM CHLORIDE 0.9 % IV SOLN: 500.0000 mL | Freq: Once | INTRAVENOUS | Status: DC

## 2020-10-24 NOTE — Patient Instructions (Signed)
Handout given:  Diverticulosis, polyps Resume previous diet Continue current medications Await pathology results Start taking citrucel 2 tablespoons daily in 12 ounces of water  Routine follow up appt with Dr. Henrene Pastor in 4 weeks  YOU HAD AN ENDOSCOPIC PROCEDURE TODAY AT [place]:   Refer to the procedure report that was given to you for any specific questions about what was found during the examination.  If the procedure report does not answer your questions, please call your gastroenterologist to clarify.  If you requested that your care partner not be given the details of your procedure findings, then the procedure report has been included in a sealed envelope for you to review at your convenience later.  YOU SHOULD EXPECT: Some feelings of bloating in the abdomen. Passage of more gas than usual.  Walking can help get rid of the air that was put into your GI tract during the procedure and reduce the bloating. If you had a lower endoscopy (such as a colonoscopy or flexible sigmoidoscopy) you may notice spotting of blood in your stool or on the toilet paper. If you underwent a bowel prep for your procedure, you may not have a normal bowel movement for a few days.  Please Note:  You might notice some irritation and congestion in your nose or some drainage.  This is from the oxygen used during your procedure.  There is no need for concern and it should clear up in a day or so.  SYMPTOMS TO REPORT IMMEDIATELY:  Following lower endoscopy (colonoscopy or flexible sigmoidoscopy):  Excessive amounts of blood in the stool  Significant tenderness or worsening of abdominal pains  Swelling of the abdomen that is new, acute  Fever of 100F or higher  For urgent or emergent issues, a gastroenterologist can be reached at any hour by calling [phone]. Do not use MyChart messaging for urgent concerns.   DIET:  We do recommend a small meal at first, but then you may proceed to your  regular diet.  Drink plenty of fluids but you should avoid alcoholic beverages for 24 hours.  ACTIVITY:  You should plan to take it easy for the rest of today and you should NOT DRIVE or use heavy machinery until tomorrow (because of the sedation medicines used during the test).    FOLLOW UP: Our staff will call the number listed on your records 48-72 hours following your procedure to check on you and address any questions or concerns that you may have regarding the information given to you following your procedure. If we do not reach you, we will leave a message.  We will attempt to reach you two times.  During this call, we will ask if you have developed any symptoms of COVID 19. If you develop any symptoms (ie: fever, flu-like symptoms, shortness of breath, cough etc.) before then, please call [phone].  If you test positive for Covid 19 in the 2 weeks post procedure, please call and report this information to [place].    If any biopsies were taken you will be contacted by phone or by letter within the next 1-3 weeks.  Please call us at [phone] if you have not heard about the biopsies in 3 weeks.   SIGNATURES/CONFIDENTIALITY: You and/or your care partner have signed paperwork which will be entered into your electronic medical record.  These signatures attest to the fact that that the information above on your After Visit Summary has been reviewed and is understood.  Full responsibility of the confidentiality of this discharge information lies with you and/or your care-partner.  

## 2020-10-24 NOTE — Progress Notes (Signed)
Sedate, gd SR, tolerated procedure well, VSS, report to RN 

## 2020-10-24 NOTE — Progress Notes (Signed)
ETCO2 not detecting, gd SR's, O2 sat's 94-96

## 2020-10-24 NOTE — Progress Notes (Signed)
HISTORY OF PRESENT ILLNESS:  Candice Brown is a 87 y.o. female who presents today for colonoscopy to evaluate ongoing problems with diarrhea and urgency.  She was evaluated in the office by the GI physician assistant May 18, 2020.  See that dictation.  Symptoms improved short-term after discontinuing magnesium supplement.  However, she called with recurrent symptoms for which colonoscopy is recommended.  She does have a history of IBS with a tendency toward constipation.  REVIEW OF SYSTEMS:  All non-GI ROS negative.  Past Medical History:  Diagnosis Date   Anemia    Arthritis    Colon polyps    hyperplastic   Depression    Diverticulosis    GERD (gastroesophageal reflux disease)    Hiatal hernia    Hyperlipidemia    IBS (irritable bowel syndrome)    Internal hemorrhoids    Lung nodule    Occipital neuralgia    Peripheral neuropathy     Past Surgical History:  Procedure Laterality Date   ABDOMINAL HYSTERECTOMY  1972   APPENDECTOMY  1959   CATARACT EXTRACTION Bilateral    left 03/2014, right eye 06/2016   CHOLECYSTECTOMY      Social History Candice Brown  reports that she quit smoking about 4 years ago. Her smoking use included cigarettes. She smoked an average of 1 pack per day. She has never used smokeless tobacco. She reports current alcohol use. She reports that she does not use drugs.  family history includes Breast cancer (age of onset: 32) in her mother; CVA in her father; Heart attack in her paternal grandfather; Heart attack (age of onset: 91) in her father; Heart disease in her father.  Allergies  Allergen Reactions   Linzess [Linaclotide] Diarrhea    Increased leg cramps   Oxycodone Nausea Only     ( made very sick)   Benzalkonium Chloride Rash   Neosporin [Neomycin-Polymyxin-Gramicidin] Rash   Tape Rash       PHYSICAL EXAMINATION:  Vital signs: BP (!) 120/57   Pulse 63   Temp 98 F (36.7 C)   Ht 5' 2.25" (1.581 m)   Wt 118 lb (53.5 kg)   SpO2 96%    BMI 21.41 kg/m  General: Well-developed, well-nourished, no acute distress HEENT: Sclerae are anicteric, conjunctiva pink. Oral mucosa intact Lungs: Clear Heart: Regular Abdomen: soft, nontender, nondistended, no obvious ascites, no peritoneal signs, normal bowel sounds. No organomegaly. Extremities: No edema Psychiatric: alert and oriented x3. Cooperative     ASSESSMENT:  1.  Problems with persistent urgency and loose stools of uncertain etiology.  Rule out microscopic colitis   PLAN:  1.  Colonoscopy with biopsies

## 2020-10-24 NOTE — Op Note (Signed)
[place] Patient Name: Candice Brown Procedure Date: 10/24/2020 4:24 PM MRN: [id number] Endoscopist: Docia Chuck. Henrene Pastor , MD Age: 87 Referring MD:  Date of Birth: December 30, 1931 Gender: Female Account #: [id number] Procedure:                Colonoscopy with cold snare polypectomy x 2; with                            biopsies Indications:              Clinically significant diarrhea of unexplained                            origin. Irregular bowels. Urgency. Occasional                            incontinence. About 6+ months duration Medicines:                Monitored Anesthesia Care Procedure:                Pre-Anesthesia Assessment:                           - Prior to the procedure, a History and Physical                            was performed, and patient medications and                            allergies were reviewed. The patient's tolerance of                            previous anesthesia was also reviewed. The risks                            and benefits of the procedure and the sedation                            options and risks were discussed with the patient.                            All questions were answered, and informed consent                            was obtained. Prior Anticoagulants: The patient has                            taken no previous anticoagulant or antiplatelet                            agents. ASA Grade Assessment: II - A patient with                            mild systemic disease. After reviewing the risks  and benefits, the patient was deemed in                            satisfactory condition to undergo the procedure.                           After obtaining informed consent, the colonoscope                            was passed under direct vision. Throughout the                            procedure, the patient's blood pressure, pulse, and                            oxygen saturations were monitored  continuously. The                            Olympus PCF-H190DL [id number]) Colonoscope was                            introduced through the anus and advanced to the the                            cecum, identified by appendiceal orifice and                            ileocecal valve. The ileocecal valve, appendiceal                            orifice, and rectum were photographed. The quality                            of the bowel preparation was excellent. The                            colonoscopy was performed without difficulty. The                            patient tolerated the procedure well. The bowel                            preparation used was SUPREP via split dose                            instruction. Scope In: 4:40:28 PM Scope Out: 5:05:01 PM Scope Withdrawal Time: 0 hours 13 minutes 43 seconds  Total Procedure Duration: 0 hours 24 minutes 33 seconds  Findings:                 A diffuse area of moderate melanosis was found in                            the entire colon. Biopsies were taken with a cold  forceps for histology.                           Two polyps were found in the sigmoid colon and                            ascending colon. The polyps were 4 to 5 mm in size.                            These polyps were removed with a cold snare.                            Resection and retrieval were complete.                           Multiple diverticula were found in the left colon                            and right colon. Severe left-sided changes with                            rectosigmoid stenosis.                           The entire examined colon appeared normal on direct                            and retroflexion views. Complications:            No immediate complications. Estimated blood loss:                            None. Estimated Blood Loss:     Estimated blood loss: none. Impression:               - Melanosis in the  colon. Biopsied.                           - Two 4 to 5 mm polyps in the sigmoid colon and in                            the ascending colon, removed with a cold snare.                            Resected and retrieved.                           - Diverticulosis in the left colon and in the right                            colon.                           - The entire examined colon is normal on direct and  retroflexion views. Recommendation:           - Repeat colonoscopy is not recommended for                            surveillance.                           - Patient has a contact number available for                            emergencies. The signs and symptoms of potential                            delayed complications were discussed with the                            patient. Return to normal activities tomorrow.                            Written discharge instructions were provided to the                            patient.                           - Resume previous diet.                           - Continue present medications.                           - Await pathology results. We will contact you when                            the results are available                           - RECOMMEND CITRUCEL 2 tablespoons daily in 12                            ounces of water or juice                           - Routine office follow-up with Dr. Henrene Pastor in about                            4 weeks Docia Chuck. Henrene Pastor, MD 10/24/2020 5:13:03 PM This report has been signed electronically.

## 2020-10-24 NOTE — Progress Notes (Signed)
Called to room to assist during endoscopic procedure.  Patient ID and intended procedure confirmed with present staff. Received instructions for my participation in the procedure from the performing physician.  

## 2020-10-26 ENCOUNTER — Telehealth: Payer: Self-pay

## 2020-10-26 NOTE — Telephone Encounter (Signed)
  Follow up Call-  Call back number 10/24/2020  Post procedure Call Back phone  # [phone]  Permission to leave phone message Yes  Some recent data might be hidden     Patient questions:  Do you have a fever, pain , or abdominal swelling? No. Pain Score  0 *  Have you tolerated food without any problems? Yes.    Have you been able to return to your normal activities? Yes.    Do you have any questions about your discharge instructions: Diet   No. Medications  No. Follow up visit  No.  Do you have questions or concerns about your Care? No.  Actions: * If pain score is 4 or above: No action needed, pain <4. Have you developed a fever since your procedure? no  2.   Have you had an respiratory symptoms (SOB or cough) since your procedure? no  3.   Have you tested positive for COVID 19 since your procedure no  4.   Have you had any family members/close contacts diagnosed with the COVID 19 since your procedure?  no   If yes to any of these questions please route to Joylene John, RN and Joella Prince, RN

## 2020-10-26 NOTE — Telephone Encounter (Signed)
NO ANSWER, MESSAGE LEFT FOR PATIENT. 

## 2020-10-27 ENCOUNTER — Encounter: Payer: Self-pay | Admitting: Internal Medicine

## 2020-12-01 ENCOUNTER — Other Ambulatory Visit: Payer: Self-pay | Admitting: Internal Medicine

## 2020-12-01 DIAGNOSIS — Z1231 Encounter for screening mammogram for malignant neoplasm of breast: Secondary | ICD-10-CM

## 2020-12-06 ENCOUNTER — Ambulatory Visit (INDEPENDENT_AMBULATORY_CARE_PROVIDER_SITE_OTHER): Payer: Medicare Other | Admitting: Internal Medicine

## 2020-12-06 ENCOUNTER — Encounter: Payer: Self-pay | Admitting: Internal Medicine

## 2020-12-06 VITALS — BP 120/62 | HR 64 | Ht 62.0 in | Wt 122.0 lb

## 2020-12-06 DIAGNOSIS — R194 Change in bowel habit: Secondary | ICD-10-CM

## 2020-12-06 NOTE — Patient Instructions (Signed)
If you are age 85 or older, your body mass index should be between 23-30. Your Body mass index is 22.31 kg/m. If this is out of the aforementioned range listed, please consider follow up with your Primary Care Provider.  If you are age 94 or younger, your body mass index should be between 19-25. Your Body mass index is 22.31 kg/m. If this is out of the aformentioned range listed, please consider follow up with your Primary Care Provider.   ________________________________________________________  The [facility] providers would like to encourage you to use [facility] to communicate with providers for non-urgent requests or questions.  Due to long hold times on the telephone, sending your provider a message by [facility] may be a faster and more efficient way to get a response.  Please allow 48 business hours for a response.  Please remember that this is for non-urgent requests.  _______________________________________________________  Take 2 tablespoons of Citrucel daily  Please follow up as needed

## 2020-12-06 NOTE — Progress Notes (Signed)
HISTORY OF PRESENT ILLNESS:  Candice Brown is a 87 y.o. female with a history of chronic constipation who was seen in this office by the GI PA May 18, 2020 regarding problems with diarrhea and incontinence.  Her magnesium was stopped.  She improved.  She subsequently contacted the office stating that she was having more issues with loose stools.  She was set up for colonoscopy which was performed October 24, 2020.  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  This was biopsy confirmed.  2 diminutive colon polyps were removed and found to be tubular adenomas.  She was also noted to have pandiverticulosis.  Citrucel 2 tablespoons daily was recommended as the patient reported alternating bowel habits.  She presents for follow-up at this time.  She tells me that since her colonoscopy she is doing better.  She does have occasional diarrhea but not as often or as bad.  No incontinent episodes since her procedure.  She does take fiber tablets at home.  This is not new.  She also takes some over-the-counter IBS medication but is not sure of the name.  She does wear protective undergarments when she is out.  She does not have a bowel movement every day.  She does take occasional Imodium when she has diarrhea.  This will result in having no bowel movements for several days.  She has no nocturnal symptoms.  She does wear protective undergarments when she is out.  She had several questions regarding findings on her colonoscopy including the report of rectosigmoid stenosis and melanosis.  Overall she is pleased that she is feeling better.  REVIEW OF SYSTEMS:  All non-GI ROS negative unless otherwise stated in the HPI.  Past Medical History:  Diagnosis Date   Anemia    Arthritis    Colon polyps    hyperplastic   Depression    Diverticulosis    GERD (gastroesophageal reflux disease)    Hiatal hernia    Hyperlipidemia    IBS (irritable bowel syndrome)    Internal hemorrhoids    Lung nodule    Occipital  neuralgia    Peripheral neuropathy     Past Surgical History:  Procedure Laterality Date   ABDOMINAL HYSTERECTOMY  1972   APPENDECTOMY  1959   CATARACT EXTRACTION Bilateral    left 03/2014, right eye 06/2016   CHOLECYSTECTOMY      Social History Candice Brown  reports that she quit smoking about 4 years ago. Her smoking use included cigarettes. She smoked an average of 1 pack per day. She has never used smokeless tobacco. She reports current alcohol use. She reports that she does not use drugs.  family history includes Breast cancer (age of onset: 32) in her mother; CVA in her father; Heart attack in her paternal grandfather; Heart attack (age of onset: 30) in her father; Heart disease in her father.  Allergies  Allergen Reactions   Linzess [Linaclotide] Diarrhea    Increased leg cramps   Oxycodone Nausea Only     ( made very sick)   Benzalkonium Chloride Rash   Neosporin [Neomycin-Polymyxin-Gramicidin] Rash   Tape Rash       PHYSICAL EXAMINATION: Vital signs: BP 120/62   Pulse 64   Ht 5\' 2"  (1.575 m)   Wt 122 lb (55.3 kg)   BMI 22.31 kg/m   Constitutional: generally well-appearing, no acute distress Psychiatric: alert and oriented x3, cooperative Eyes: Anicteric Mouth: Mask Abdomen: Not reexamined Skin: no lesions on visible extremities  Neuro: No gross deficits  ASSESSMENT:  1.  Alternating bowel habits with occasional diarrhea and incontinence.  Improved. 2.  Recent colonoscopy with diverticulosis, melanosis, and diminutive adenomas. 3.  General medical problems   PLAN:  1.  I reviewed the findings and pathology from her recent colonoscopy 2.  Recommend Citrucel 1 to 2 tablespoons daily.  This to help bowel consistency 3.  Continue to wear protective undergarments as needed 4.  GI follow-up as needed

## 2021-02-17 ENCOUNTER — Telehealth: Payer: Self-pay | Admitting: Internal Medicine

## 2021-02-17 NOTE — Telephone Encounter (Signed)
Left voicemail for pt to call back.

## 2021-02-17 NOTE — Telephone Encounter (Signed)
Inbound call from patient requesting a call back. Would not tell the reason why

## 2021-02-17 NOTE — Telephone Encounter (Signed)
Pt reports she has had a recent colonoscopy that didn't show any reason for her diarrhea. Pt states she is still having diarrhea any time she eats. Pt reports she has 1-2 bm's per day usually but can go up to 3 times per day. Pt also states it is not always diarrhea, sometimes it may be difficult to pass and she has to disimpact herself. Pt stated that she had an episode where she was incontinent of stool. Pt said she has been using imodium for diarrhea but then will be constipated for the next 2-3 days and will have to use suppositories. She said Dr. Henrene Pastor advised her to use Citrucel but she states this makes her have diarrhea for 3-4 hours after. Please advise.

## 2021-02-17 NOTE — Telephone Encounter (Signed)
Resume Citrucel 2 tablespoons in the evening Take 1/2 of an imodium every third day

## 2021-02-20 NOTE — Telephone Encounter (Signed)
Called and spoke with pt. Gave pt Dr. Blanch Media recommendations. Pt verbalized understanding and had no other concerns at end of call.

## 2021-03-06 DIAGNOSIS — E785 Hyperlipidemia, unspecified: Secondary | ICD-10-CM | POA: Diagnosis not present

## 2021-03-06 DIAGNOSIS — I10 Essential (primary) hypertension: Secondary | ICD-10-CM | POA: Diagnosis not present

## 2021-03-08 DIAGNOSIS — I10 Essential (primary) hypertension: Secondary | ICD-10-CM | POA: Diagnosis not present

## 2021-03-08 DIAGNOSIS — E785 Hyperlipidemia, unspecified: Secondary | ICD-10-CM | POA: Diagnosis not present

## 2021-03-13 DIAGNOSIS — R2681 Unsteadiness on feet: Secondary | ICD-10-CM | POA: Diagnosis not present

## 2021-03-13 DIAGNOSIS — Z0000 Encounter for general adult medical examination without abnormal findings: Secondary | ICD-10-CM | POA: Diagnosis not present

## 2021-03-13 DIAGNOSIS — I700 Atherosclerosis of aorta: Secondary | ICD-10-CM | POA: Diagnosis not present

## 2021-03-13 DIAGNOSIS — Z1331 Encounter for screening for depression: Secondary | ICD-10-CM | POA: Diagnosis not present

## 2021-03-13 DIAGNOSIS — I10 Essential (primary) hypertension: Secondary | ICD-10-CM | POA: Diagnosis not present

## 2021-03-13 DIAGNOSIS — F329 Major depressive disorder, single episode, unspecified: Secondary | ICD-10-CM | POA: Diagnosis not present

## 2021-03-13 DIAGNOSIS — F17210 Nicotine dependence, cigarettes, uncomplicated: Secondary | ICD-10-CM | POA: Diagnosis not present

## 2021-03-13 DIAGNOSIS — R82998 Other abnormal findings in urine: Secondary | ICD-10-CM | POA: Diagnosis not present

## 2021-03-13 DIAGNOSIS — E785 Hyperlipidemia, unspecified: Secondary | ICD-10-CM | POA: Diagnosis not present

## 2021-03-13 DIAGNOSIS — G629 Polyneuropathy, unspecified: Secondary | ICD-10-CM | POA: Diagnosis not present

## 2021-03-13 DIAGNOSIS — Z1212 Encounter for screening for malignant neoplasm of rectum: Secondary | ICD-10-CM | POA: Diagnosis not present

## 2021-04-03 ENCOUNTER — Ambulatory Visit
Admission: RE | Admit: 2021-04-03 | Discharge: 2021-04-03 | Disposition: A | Discharge status: Home / Self Care | Payer: PRIVATE HEALTH INSURANCE | Source: Ambulatory Visit | Attending: Internal Medicine | Admitting: Internal Medicine

## 2021-04-03 DIAGNOSIS — Z1231 Encounter for screening mammogram for malignant neoplasm of breast: Secondary | ICD-10-CM

## 2021-04-04 ENCOUNTER — Other Ambulatory Visit: Payer: Self-pay | Admitting: Internal Medicine

## 2021-04-04 DIAGNOSIS — R928 Other abnormal and inconclusive findings on diagnostic imaging of breast: Secondary | ICD-10-CM

## 2021-04-13 ENCOUNTER — Ambulatory Visit (INDEPENDENT_AMBULATORY_CARE_PROVIDER_SITE_OTHER): Payer: Medicare Other

## 2021-04-13 ENCOUNTER — Ambulatory Visit (INDEPENDENT_AMBULATORY_CARE_PROVIDER_SITE_OTHER): Payer: Medicare Other | Admitting: Physician Assistant

## 2021-04-13 ENCOUNTER — Encounter: Payer: Self-pay | Admitting: Physician Assistant

## 2021-04-13 DIAGNOSIS — M545 Low back pain, unspecified: Secondary | ICD-10-CM

## 2021-04-13 NOTE — Progress Notes (Signed)
? ?Office Visit Note ?  ?Patient: Candice Brown           ?Date of Birth: 03/31/1931           ?MRN: [id number] ?Visit Date: 04/13/2021 ?             ?Requested by: Burnard Bunting, MD ?[address] ?[place],  [place] [address] ?PCP: Burnard Bunting, MD ? ?Chief Complaint  ?Patient presents with  ? Lower Back - Pain  ? ? ? ? ?HPI: ?Candice Brown is a pleasant active 88 year old woman who presents today with a 2-week history of left lower back pain.  She has been involved in ballroom dancing for 10 years.  She states that she was in a class and was lifted by her instructor in.  She felt a pull in her left lower back.  She denies any numbing tingling loss of bowel or bladder control.  She has tried lidocaine patches but had a reaction to the adhesive.  She has difficulty with anti-inflammatories because they bother her stomach she tried Biofreeze without much success.  She denies any radiation of symptoms down into her legs.  She notices the symptoms are most significant when she changes positions or after she has been on her feet for a while she does have some home exercises she is tried to do that were given to her by a massage therapist ? ?Assessment & Plan: ?Visit Diagnoses:  ?1. Acute left-sided low back pain without sciatica   ? ? ?Plan: Patient with long history of lower back pain scoliosis and advanced facet arthropathy.  Presents with new onset of left lower back pain in the paravertebral muscles after twisting when dancing.  I did give her information about Voltaren gel.  Her exam is reassuring that she does not have any weakness.  She is a longtime patient of Dr. Durward Fortes I will speak to him about her this afternoon when any other suggestions ? ?Follow-Up Instructions: No follow-ups on file.  ? ?Ortho Exam ? ?Patient is alert, oriented, no adenopathy, well-dressed, normal affect, normal respiratory effort. ?Examination of her lower back.  She does have a notable scoliosis.  She has no tenderness to palpation  along the spine itself no step-offs.  Her strength is 5 out of 5 with both lower extremities with resisted dorsiflexion plantarflexion of her ankles knees and flexion of her hips.  She has a negative straight leg raise bilaterally.  She has tenderness to deep palpation focally in the lower back to the left. ? ?Imaging: ?No results found. ?No images are attached to the encounter. ? ?Labs: ?Lab Results  ?Component Value Date  ? HGBA1C 5.5 12/31/2018  ? ESRSEDRATE 2 12/31/2018  ? CRP 2 12/31/2018  ? ? ? ?No results found for: ALBUMIN, PREALBUMIN, CBC ? ?No results found for: MG ?No results found for: VD25OH ? ?No results found for: PREALBUMIN ?CBC EXTENDED Latest Ref Rng & Units 11/18/2018  ?WBC 3.8 - 10.8 Thousand/uL 6.4  ?RBC 3.80 - 5.10 Million/uL 3.91  ?HGB 11.7 - 15.5 g/dL 12.8  ?HCT 35.0 - 45.0 % 38.2  ?PLT 140 - 400 Thousand/uL 333  ?NEUTROABS 1,500 - 7,800 cells/uL 3,770  ?LYMPHSABS 850 - 3,900 cells/uL 1,837  ? ? ? ?There is no height or weight on file to calculate BMI. ? ?Orders:  ?Orders Placed This Encounter  ?Procedures  ? XR Lumbar Spine 2-3 Views  ? ?No orders of the defined types were placed in this encounter. ? ? ?  Procedures: ?No procedures performed ? ?Clinical Data: ?No additional findings. ? ?ROS: ? ?All other systems negative, except as noted in the HPI. ?Review of Systems ? ?Objective: ?Vital Signs: There were no vitals taken for this visit. ? ?Specialty Comments:  ?No specialty comments available. ? ?PMFS History: ?Patient Active Problem List  ? Diagnosis Date Noted  ? Chronic diarrhea 05/18/2020  ? Change in bowel habits 05/18/2020  ? Knee pain, bilateral 11/18/2018  ? ?Past Medical History:  ?Diagnosis Date  ? Anemia   ? Arthritis   ? Colon polyps   ? hyperplastic  ? Depression   ? Diverticulosis   ? GERD (gastroesophageal reflux disease)   ? Hiatal hernia   ? Hyperlipidemia   ? IBS (irritable bowel syndrome)   ? Internal hemorrhoids   ? Lung nodule   ? Occipital neuralgia   ? Peripheral  neuropathy   ?  ?Family History  ?Problem Relation Age of Onset  ? Breast cancer Mother 29  ? Heart disease Father   ? CVA Father   ? Heart attack Father 53  ? Heart attack Paternal Grandfather   ? Colon cancer Neg Hx   ? Stomach cancer Neg Hx   ? Rectal cancer Neg Hx   ? Esophageal cancer Neg Hx   ?  ?Past Surgical History:  ?Procedure Laterality Date  ? ABDOMINAL HYSTERECTOMY  1972  ? APPENDECTOMY  1959  ? CATARACT EXTRACTION Bilateral   ? left 03/2014, right eye 06/2016  ? CHOLECYSTECTOMY    ? ?Social History  ? ?Occupational History  ? Occupation: retired  ?Tobacco Use  ? Smoking status: Former  ?  Packs/day: 1.00  ?  Types: Cigarettes  ?  Quit date: 10/13/2016  ?  Years since quitting: 4.5  ? Smokeless tobacco: Never  ? Tobacco comments:  ?  12/31/18  nicotene gum, lozenges  ?Vaping Use  ? Vaping Use: Never used  ?Substance and Sexual Activity  ? Alcohol use: Yes  ?  Alcohol/week: 0.0 standard drinks  ?  Comment: less than 1/2 glass wine daily  ? Drug use: No  ? Sexual activity: Not on file  ? ? ? ? ? ?

## 2021-04-24 ENCOUNTER — Other Ambulatory Visit: Payer: PRIVATE HEALTH INSURANCE

## 2021-04-26 ENCOUNTER — Ambulatory Visit: Payer: PRIVATE HEALTH INSURANCE | Admitting: Orthopaedic Surgery

## 2021-04-27 ENCOUNTER — Telehealth: Payer: Self-pay | Admitting: Internal Medicine

## 2021-04-27 NOTE — Telephone Encounter (Signed)
Patient's daughter, Peggye Fothergill, called very concerned about patient.  She had a colonoscopy in January for recurrent diarrhea.  She wanted to know if she was checked for microscopic colitis.  Also, patient has had diarrhea for the last two weeks.  She has tried to get her to drink pedialyte, coconut water, and various other liquids to try to keep her hydrated, but it is going right through her.  She wants advice as to what she can do to help patient.  Please call and advise.  Thank you.   ? ?Patient uses the [facility] on [address]. ?

## 2021-04-27 NOTE — Telephone Encounter (Signed)
Spoke with pts daughter and discussed path letter with her, let her know the biopsy from the colonoscopy was negative for microscopic colitis. She verbalized understanding and reports she is taking her mother to see her PCP today. Encouraged her to contact our office is she had any further concerns. ?

## 2021-05-15 ENCOUNTER — Other Ambulatory Visit: Payer: Medicare Other

## 2021-05-15 ENCOUNTER — Inpatient Hospital Stay: Admission: RE | Admit: 2021-05-15 | Payer: Medicare Other | Source: Ambulatory Visit

## 2021-05-24 ENCOUNTER — Encounter: Payer: Self-pay | Admitting: Orthopaedic Surgery

## 2021-05-24 ENCOUNTER — Ambulatory Visit (INDEPENDENT_AMBULATORY_CARE_PROVIDER_SITE_OTHER): Payer: BC Managed Care – PPO | Admitting: Orthopaedic Surgery

## 2021-05-24 DIAGNOSIS — M545 Low back pain, unspecified: Secondary | ICD-10-CM

## 2021-05-24 NOTE — Progress Notes (Signed)
? ?Office Visit Note ?  ?Patient: Candice Brown           ?Date of Birth: 1931/04/12           ?MRN: [id number] ?Visit Date: 05/24/2021 ?             ?Requested by: Burnard Bunting, MD ?[address] ?[place],  [place] [address] ?PCP: Burnard Bunting, MD ? ? ?Assessment & Plan: ?Visit Diagnoses:  ?1. Acute left-sided low back pain without sciatica   ?2. Acute bilateral low back pain without sciatica   ? ? ?Plan: Candice Brown been experiencing low back pain since Candice Brown was lifted by her dance instructor recently.  Candice Brown is really having a difficult time being up and around.  Candice Brown had x-rays recently that demonstrated a possible compression fracture of L3.  Candice Brown is not much better so I think it is worth obtaining an MRI scan.  Candice Brown is not having any radicular pain.  We will also try a lumbar support to give her some relief ? ?Follow-Up Instructions: Return After MRI scan lumbar spine.  ? ?Orders:  ?Orders Placed This Encounter  ?Procedures  ? MR Lumbar Spine w/o contrast  ? ?No orders of the defined types were placed in this encounter. ? ? ? ? Procedures: ?No procedures performed ? ? ?Clinical Data: ?No additional findings. ? ? ?Subjective: ?Chief Complaint  ?Patient presents with  ? Lower Back - Follow-up  ?Patient presents today for follow up on her lower back. Candice Brown saw Bevely Palmer 6 weeks ago. Candice Brown said that Candice Brown was doing well yesterday and this morning. Candice Brown is now hurting. Her pain is usually located at the left side of her lower back. Candice Brown uses Voltaren Gel.  No radicular symptoms ? ?HPI ? ?Review of Systems ? ? ?Objective: ?Vital Signs: There were no vitals taken for this visit. ? ?Physical Exam ?Constitutional:   ?   Appearance: Candice Brown is well-developed.  ?Pulmonary:  ?   Effort: Pulmonary effort is normal.  ?Skin: ?   General: Skin is warm and dry.  ?Neurological:  ?   Mental Status: Candice Brown is alert and oriented to person, place, and time.  ?Psychiatric:     ?   Behavior: Behavior normal.  ? ? ?Ortho Exam awake alert and oriented  x3.  Comfortable sitting.  There is some percussible tenderness in the lower lumbar spine and just to the left of the lumbar spine and an area about the size of a fist.  No masses.  Painless range of motion of both hips.  No flank pain. ? ?Specialty Comments:  ?No specialty comments available. ? ?Imaging: ?No results found. ? ? ?PMFS History: ?Patient Active Problem List  ? Diagnosis Date Noted  ? Low back pain 05/24/2021  ? Chronic diarrhea 05/18/2020  ? Change in bowel habits 05/18/2020  ? Knee pain, bilateral 11/18/2018  ? ?Past Medical History:  ?Diagnosis Date  ? Anemia   ? Arthritis   ? Colon polyps   ? hyperplastic  ? Depression   ? Diverticulosis   ? GERD (gastroesophageal reflux disease)   ? Hiatal hernia   ? Hyperlipidemia   ? IBS (irritable bowel syndrome)   ? Internal hemorrhoids   ? Lung nodule   ? Occipital neuralgia   ? Peripheral neuropathy   ?  ?Family History  ?Problem Relation Age of Onset  ? Breast cancer Mother 51  ? Heart disease Father   ? CVA Father   ? Heart attack Father  70  ? Heart attack Paternal Grandfather   ? Colon cancer Neg Hx   ? Stomach cancer Neg Hx   ? Rectal cancer Neg Hx   ? Esophageal cancer Neg Hx   ?  ?Past Surgical History:  ?Procedure Laterality Date  ? ABDOMINAL HYSTERECTOMY  1972  ? APPENDECTOMY  1959  ? CATARACT EXTRACTION Bilateral   ? left 03/2014, right eye 06/2016  ? CHOLECYSTECTOMY    ? ?Social History  ? ?Occupational History  ? Occupation: retired  ?Tobacco Use  ? Smoking status: Former  ?  Packs/day: 1.00  ?  Types: Cigarettes  ?  Quit date: 10/13/2016  ?  Years since quitting: 4.6  ? Smokeless tobacco: Never  ? Tobacco comments:  ?  12/31/18  nicotene gum, lozenges  ?Vaping Use  ? Vaping Use: Never used  ?Substance and Sexual Activity  ? Alcohol use: Yes  ?  Alcohol/week: 0.0 standard drinks  ?  Comment: less than 1/2 glass wine daily  ? Drug use: No  ? Sexual activity: Not on file  ? ? ? ? ? ? ?

## 2021-05-28 ENCOUNTER — Ambulatory Visit
Admission: RE | Admit: 2021-05-28 | Discharge: 2021-05-28 | Disposition: A | Discharge status: Home / Self Care | Payer: PRIVATE HEALTH INSURANCE | Source: Ambulatory Visit | Attending: Orthopaedic Surgery | Admitting: Orthopaedic Surgery

## 2021-05-28 DIAGNOSIS — M545 Low back pain, unspecified: Secondary | ICD-10-CM

## 2021-05-31 ENCOUNTER — Ambulatory Visit
Admission: RE | Admit: 2021-05-31 | Discharge: 2021-05-31 | Disposition: A | Discharge status: Home / Self Care | Payer: Medicare Other | Source: Ambulatory Visit | Attending: Internal Medicine | Admitting: Internal Medicine

## 2021-05-31 ENCOUNTER — Telehealth: Payer: Self-pay | Admitting: Physician Assistant

## 2021-05-31 ENCOUNTER — Ambulatory Visit: Payer: Medicare Other

## 2021-05-31 ENCOUNTER — Other Ambulatory Visit: Payer: Self-pay | Admitting: Internal Medicine

## 2021-05-31 DIAGNOSIS — N6310 Unspecified lump in the right breast, unspecified quadrant: Secondary | ICD-10-CM

## 2021-05-31 DIAGNOSIS — R928 Other abnormal and inconclusive findings on diagnostic imaging of breast: Secondary | ICD-10-CM

## 2021-05-31 DIAGNOSIS — N6489 Other specified disorders of breast: Secondary | ICD-10-CM

## 2021-05-31 NOTE — Telephone Encounter (Signed)
Pt called requesting a call back from [place]. Pt states that she need to discuss her back pains. Pt phone number is [phone]. ?

## 2021-06-06 ENCOUNTER — Ambulatory Visit
Admission: RE | Admit: 2021-06-06 | Discharge: 2021-06-06 | Disposition: A | Discharge status: Home / Self Care | Payer: Medicare Other | Source: Ambulatory Visit | Attending: Internal Medicine | Admitting: Internal Medicine

## 2021-06-06 ENCOUNTER — Other Ambulatory Visit: Payer: Self-pay | Admitting: Internal Medicine

## 2021-06-06 DIAGNOSIS — N6310 Unspecified lump in the right breast, unspecified quadrant: Secondary | ICD-10-CM

## 2021-06-06 DIAGNOSIS — N6489 Other specified disorders of breast: Secondary | ICD-10-CM

## 2021-06-07 ENCOUNTER — Ambulatory Visit: Payer: Medicare Other | Admitting: Orthopaedic Surgery

## 2021-06-08 ENCOUNTER — Encounter: Payer: Self-pay | Admitting: Orthopaedic Surgery

## 2021-06-08 ENCOUNTER — Ambulatory Visit: Payer: Medicare Other | Admitting: Orthopaedic Surgery

## 2021-06-08 DIAGNOSIS — M545 Low back pain, unspecified: Secondary | ICD-10-CM

## 2021-06-08 NOTE — Progress Notes (Signed)
? ?Office Visit Note ?  ?Patient: Candice Brown           ?Date of Birth: 01/20/1932           ?MRN: [id number] ?Visit Date: 06/08/2021 ?             ?Requested by: Burnard Bunting, MD ?[address] ?[place],  [place] [address] ?PCP: Burnard Bunting, MD ? ?Chief Complaint  ?Patient presents with  ? Lower Back - Pain  ? ? ? ? ?HPI: ?Candice Brown is here with her daughter to review the MRI of her lower back.  She has had focal lower back pain without radicular symptoms since she was lifted and turned for a dance move.  She did not have significant improvement raising concerns for a compression fracture or other nerve impingement.  She says that her pain is intermittent.  She does get some back spasms that can last for up to 30 minutes. ? ?Assessment & Plan: ?Visit Diagnoses:  ?1. Acute left-sided low back pain without sciatica   ? ? ?Plan: Her MRI was reviewed with her and her daughter and compared to previous MRI in 2017 she has only mild progression of her scoliosis.  She does have significant arthritis throughout the entire lower spine.  There is no evidence of any compression fracture tumor or other abnormality other than the advanced degenerative changes.  Since her pain is focal in the back we would recommend back stabilization program with physical therapy.  She will continue to use the Voltaren gel as needed.  She will continue to use the back support as it is helpful.  If she had progressive symptoms that were radicular radiating down her legs we could consider an epidural steroid injection.  If she had spasms lasting more than normal we could consider a muscle relaxant though obviously there is some concern over side effect profile especially given her age ? ?Follow-Up Instructions: No follow-ups on file.  ? ?Ortho Exam ? ?Patient is alert, oriented, no adenopathy, well-dressed, normal affect, normal respiratory effort. ?Examination patient she is comfortable pleasant to exam sitting with her daughter.  She is  alert appropriate normal respiratory effort.  She has focal tenderness to the lower spine no no step-off no radiation to her hips or legs.  Her sensation in her lower extremities are intact as is her strength. ? ?Imaging: ?No results found. ?No images are attached to the encounter. ? ?Labs: ?Lab Results  ?Component Value Date  ? HGBA1C 5.5 12/31/2018  ? ESRSEDRATE 2 12/31/2018  ? CRP 2 12/31/2018  ? ? ? ?No results found for: ALBUMIN, PREALBUMIN, CBC ? ?No results found for: MG ?No results found for: VD25OH ? ?No results found for: PREALBUMIN ? ?  Latest Ref Rng & Units 11/18/2018  ? 10:35 AM  ?CBC EXTENDED  ?WBC 3.8 - 10.8 Thousand/uL 6.4    ?RBC 3.80 - 5.10 Million/uL 3.91    ?Hemoglobin 11.7 - 15.5 g/dL 12.8    ?HCT 35.0 - 45.0 % 38.2    ?Platelets 140 - 400 Thousand/uL 333    ?NEUT# 1,500 - 7,800 cells/uL 3,770    ?Lymph# 850 - 3,900 cells/uL 1,837    ? ? ? ?There is no height or weight on file to calculate BMI. ? ?Orders:  ?Orders Placed This Encounter  ?Procedures  ? Ambulatory referral to Physical Therapy  ? ?No orders of the defined types were placed in this encounter. ? ? ? Procedures: ?No procedures performed ? ?Clinical  Data: ?No additional findings. ? ?ROS: ? ?All other systems negative, except as noted in the HPI. ?Review of Systems ? ?Objective: ?Vital Signs: There were no vitals taken for this visit. ? ?Specialty Comments:  ?No specialty comments available. ? ?PMFS History: ?Patient Active Problem List  ? Diagnosis Date Noted  ? Low back pain 05/24/2021  ? Chronic diarrhea 05/18/2020  ? Change in bowel habits 05/18/2020  ? Knee pain, bilateral 11/18/2018  ? ?Past Medical History:  ?Diagnosis Date  ? Anemia   ? Arthritis   ? Colon polyps   ? hyperplastic  ? Depression   ? Diverticulosis   ? GERD (gastroesophageal reflux disease)   ? Hiatal hernia   ? Hyperlipidemia   ? IBS (irritable bowel syndrome)   ? Internal hemorrhoids   ? Lung nodule   ? Occipital neuralgia   ? Peripheral neuropathy   ?  ?Family  History  ?Problem Relation Age of Onset  ? Breast cancer Mother 38  ? Heart disease Father   ? CVA Father   ? Heart attack Father 76  ? Heart attack Paternal Grandfather   ? Colon cancer Neg Hx   ? Stomach cancer Neg Hx   ? Rectal cancer Neg Hx   ? Esophageal cancer Neg Hx   ?  ?Past Surgical History:  ?Procedure Laterality Date  ? ABDOMINAL HYSTERECTOMY  1972  ? APPENDECTOMY  1959  ? CATARACT EXTRACTION Bilateral   ? left 03/2014, right eye 06/2016  ? CHOLECYSTECTOMY    ? ?Social History  ? ?Occupational History  ? Occupation: retired  ?Tobacco Use  ? Smoking status: Former  ?  Packs/day: 1.00  ?  Types: Cigarettes  ?  Quit date: 10/13/2016  ?  Years since quitting: 4.6  ? Smokeless tobacco: Never  ? Tobacco comments:  ?  12/31/18  nicotene gum, lozenges  ?Vaping Use  ? Vaping Use: Never used  ?Substance and Sexual Activity  ? Alcohol use: Yes  ?  Alcohol/week: 0.0 standard drinks  ?  Comment: less than 1/2 glass wine daily  ? Drug use: No  ? Sexual activity: Not on file  ? ? ? ? ? ?

## 2021-06-15 ENCOUNTER — Ambulatory Visit
Admission: RE | Admit: 2021-06-15 | Discharge: 2021-06-15 | Disposition: A | Discharge status: Home / Self Care | Payer: Medicare Other | Source: Ambulatory Visit | Attending: Internal Medicine | Admitting: Internal Medicine

## 2021-06-15 DIAGNOSIS — N6489 Other specified disorders of breast: Secondary | ICD-10-CM

## 2021-06-28 ENCOUNTER — Other Ambulatory Visit: Payer: Self-pay | Admitting: General Surgery

## 2021-06-28 DIAGNOSIS — Z170 Estrogen receptor positive status [ER+]: Secondary | ICD-10-CM

## 2021-06-29 ENCOUNTER — Other Ambulatory Visit: Payer: Self-pay | Admitting: *Deleted

## 2021-06-29 ENCOUNTER — Telehealth: Payer: Self-pay | Admitting: Hematology and Oncology

## 2021-06-29 DIAGNOSIS — Z170 Estrogen receptor positive status [ER+]: Secondary | ICD-10-CM

## 2021-06-29 NOTE — Telephone Encounter (Signed)
Scheduled appt per 5/18 referral. Pt is aware of appt date and time. Pt is aware to arrive 15 mins prior to appt time and to bring and updated insurance card. Pt is aware of appt location.

## 2021-06-29 NOTE — Telephone Encounter (Signed)
R/s pt's appt due to error in scheduling. Pt is aware of new appt date and time.

## 2021-06-30 ENCOUNTER — Other Ambulatory Visit: Payer: Self-pay | Admitting: General Surgery

## 2021-06-30 DIAGNOSIS — Z170 Estrogen receptor positive status [ER+]: Secondary | ICD-10-CM

## 2021-07-06 ENCOUNTER — Other Ambulatory Visit: Payer: Self-pay

## 2021-07-06 ENCOUNTER — Encounter (HOSPITAL_BASED_OUTPATIENT_CLINIC_OR_DEPARTMENT_OTHER): Payer: Self-pay | Admitting: General Surgery

## 2021-07-11 NOTE — Pre-Procedure Instructions (Signed)
Surgical Instructions    Your procedure is scheduled on Tuesday 07/18/21.   Report to [facility] Main Entrance "A" at 08:00 A.M., then check in with the Admitting office.  Call this number if you have problems the morning of surgery:  [phone]   If you have any questions prior to your surgery date call [phone]: Open Monday-Friday 8am-4pm    Remember:  Do not eat after midnight the night before your surgery  You may drink clear liquids until 07:00 A.M. the morning of your surgery.   Clear liquids allowed are: Water, Non-Citrus Juices (without pulp), Carbonated Beverages, Clear Tea, Black Coffee ONLY (NO MILK, CREAM OR POWDERED CREAMER of any kind), and Gatorade    Take these medicines the morning of surgery with A SIP OF WATER:   pantoprazole (PROTONIX)  pregabalin (LYRICA)   rosuvastatin (CRESTOR)  sertraline (ZOLOFT)   Polyvinyl Alcohol-Povidone (REFRESH OP)- If needed   As of today, STOP taking any Aspirin (unless otherwise instructed by your surgeon) Aleve, Naproxen, Ibuprofen, Motrin, Advil, Goody's, BC's, all herbal medications, fish oil, and all vitamins.           Do not wear jewelry or makeup Do not wear lotions, powders, perfumes/colognes, or deodorant. Do not shave 48 hours prior to surgery.  Men may shave face and neck. Do not bring valuables to the hospital. Do not wear nail polish, gel polish, artificial nails, or any other type of covering on natural nails (fingers and toes) If you have artificial nails or gel coating that need to be removed by a nail salon, please have this removed prior to surgery. Artificial nails or gel coating may interfere with anesthesia's ability to adequately monitor your vital signs.  [facility] is not responsible for any belongings or valuables. .   Do NOT Smoke (Tobacco/Vaping)  24 hours prior to your procedure  If you use a CPAP at night, you may bring your mask for your overnight stay.   Contacts, glasses, hearing aids,  dentures or partials may not be worn into surgery, please bring cases for these belongings   For patients admitted to the hospital, discharge time will be determined by your treatment team.   Patients discharged the day of surgery will not be allowed to drive home, and someone needs to stay with them for 24 hours.   SURGICAL WAITING ROOM VISITATION Patients having surgery or a procedure in a hospital may have two support people. Children under the age of 29 must have an adult with them who is not the patient. They may stay in the waiting area during the procedure and may switch out with other visitors. If the patient needs to stay at the hospital during part of their recovery, the visitor guidelines for inpatient rooms apply.  Please refer to the [facility] website for the visitor guidelines for Inpatients (after your surgery is over and you are in a regular room).       Special instructions:    Oral Hygiene is also important to reduce your risk of infection.  Remember - BRUSH YOUR TEETH THE MORNING OF SURGERY WITH YOUR REGULAR TOOTHPASTE   [place]- Preparing For Surgery  Before surgery, you can play an important role. Because skin is not sterile, your skin needs to be as free of germs as possible. You can reduce the number of germs on your skin by washing with CHG (chlorahexidine gluconate) Soap before surgery.  CHG is an antiseptic cleaner which kills germs and bonds with the  skin to continue killing germs even after washing.     Please do not use if you have an allergy to CHG or antibacterial soaps. If your skin becomes reddened/irritated stop using the CHG.  Do not shave (including legs and underarms) for at least 48 hours prior to first CHG shower. It is OK to shave your face.  Please follow these instructions carefully.     Shower the NIGHT BEFORE SURGERY and the MORNING OF SURGERY with CHG Soap.   If you chose to wash your hair, wash your hair first as usual with your  normal shampoo. After you shampoo, rinse your hair and body thoroughly to remove the shampoo.  Then [employer] and genitals (private parts) with your normal soap and rinse thoroughly to remove soap.  After that Use CHG Soap as you would any other liquid soap. You can apply CHG directly to the skin and wash gently with a scrungie or a clean washcloth.   Apply the CHG Soap to your body ONLY FROM THE NECK DOWN.  Do not use on open wounds or open sores. Avoid contact with your eyes, ears, mouth and genitals (private parts). Wash Face and genitals (private parts)  with your normal soap.   Wash thoroughly, paying special attention to the area where your surgery will be performed.  Thoroughly rinse your body with warm water from the neck down.  DO NOT shower/wash with your normal soap after using and rinsing off the CHG Soap.  Pat yourself dry with a CLEAN TOWEL.  Wear CLEAN PAJAMAS to bed the night before surgery  Place CLEAN SHEETS on your bed the night before your surgery  DO NOT SLEEP WITH PETS.   Day of Surgery:  Take a shower with CHG soap. Wear Clean/Comfortable clothing the morning of surgery Do not apply any deodorants/lotions.   Remember to brush your teeth WITH YOUR REGULAR TOOTHPASTE.    If you received a COVID test during your pre-op visit, it is requested that you wear a mask when out in public, stay away from anyone that may not be feeling well, and notify your surgeon if you develop symptoms. If you have been in contact with anyone that has tested positive in the last 10 days, please notify your surgeon.    Please read over the following fact sheets that you were given.

## 2021-07-12 ENCOUNTER — Inpatient Hospital Stay (HOSPITAL_COMMUNITY)
Admission: RE | Admit: 2021-07-12 | Discharge: 2021-07-12 | Disposition: A | Discharge status: Home / Self Care | Payer: Medicare Other | Source: Ambulatory Visit

## 2021-07-13 ENCOUNTER — Ambulatory Visit: Payer: Medicare Other | Admitting: Hematology and Oncology

## 2021-07-14 ENCOUNTER — Encounter: Payer: Self-pay | Admitting: Emergency Medicine

## 2021-07-14 ENCOUNTER — Encounter: Payer: Self-pay | Admitting: Hematology and Oncology

## 2021-07-14 ENCOUNTER — Other Ambulatory Visit: Payer: Self-pay

## 2021-07-14 ENCOUNTER — Inpatient Hospital Stay: Payer: BC Managed Care – PPO | Attending: Hematology and Oncology | Admitting: Hematology and Oncology

## 2021-07-14 ENCOUNTER — Inpatient Hospital Stay: Payer: BC Managed Care – PPO

## 2021-07-14 DIAGNOSIS — C50411 Malignant neoplasm of upper-outer quadrant of right female breast: Secondary | ICD-10-CM

## 2021-07-14 DIAGNOSIS — Z170 Estrogen receptor positive status [ER+]: Secondary | ICD-10-CM

## 2021-07-14 NOTE — Assessment & Plan Note (Signed)
This is a very pleasant 89-year-old postmenopausal female patient with newly diagnosed right breast invasive ductal carcinoma, tubular type, grade 1, ER 95% strong, PR 40% strong, Ki-67 of 1%, HER2 negative scheduled for right breast lumpectomy on June 6 referred to medical oncology for adjuvant recommendations.  We have discussed that she overall has a very favorable profile tumor and the intent of treatment is curative.  We have discussed about proceeding with lumpectomy followed by consideration for antiestrogen therapy.  Given her prognostics, I do not believe she will need any adjuvant chemotherapy at this time.  She understands that sometimes in rare cases, if the final pathology is very different, these recommendations might change.  We have focused our discussion on antiestrogen therapy.  We have discussed about tamoxifen, mechanism of action, adverse effects of tamoxifen including but not limited to fatigue, postmenopausal symptoms, vaginal dryness, hot flashes, increased risk of DVT/PE, endometrial cancer in women with the uterus, endometrial thickening.  A benefit from tamoxifen would be improvement in bone density apart from reducing risk of breast cancer.  We also discussed about aromatase inhibitors, mechanism of action, adverse effects including but not limited to fatigue, postmenopausal symptoms, arthralgias/myalgias, bone loss over time.  She is currently leaning towards tamoxifen since she is very active, had a hysterectomy and does not want to have any further bone loss.  I think this is a very reasonable choice.  She will return to clinic after surgery to review final pathology and we will initiate antiestrogen therapy depending on her discussion with radiation oncology as well.  Thank you for consulting us in the care of this patient.  Please not hesitate to contact us with any additional questions or concerns.  She also agreed to the research specimen collection study. 

## 2021-07-14 NOTE — Progress Notes (Signed)
[facility] NOTE  Patient Care Team: Burnard Bunting, MD as PCP - General (Internal Medicine) Garald Balding, MD as Consulting Physician (Orthopedic Surgery) Derrill Memo, MD as Referring Physician (Neurosurgery)  CHIEF COMPLAINTS/PURPOSE OF CONSULTATION:  Newly diagnosed breast cancer  HISTORY OF PRESENTING ILLNESS:  Candice Brown 88 y.o. female is here because of recent diagnosis of right breast cancer  I reviewed her records extensively and collaborated the history with the patient.  SUMMARY OF ONCOLOGIC HISTORY: Oncology History  Malignant neoplasm of upper-outer quadrant of right breast in female, estrogen receptor positive ([place])  04/03/2021 Mammogram   Mammogram done on April 03, 2021 showed possible distortion in the right breast, possible asymmetry in the left breast.  Diagnostic mammogram showed suspicious small mass with associated architectural distortion in the right breast at 10:00, 2 cm from the nipple.  Ultrasound-guided core needle biopsy was recommended    06/15/2021 Pathology Results   Pathology results showed invasive ductal carcinoma, tubular type, longest core measuring 5 mm in the biopsy, grade 1, minute foci of microcalcification without tumor necrosis.  Prognostic showed ER 95% positive strong staining PR 40% positive strong staining, Ki-67 of 1% and HER2 negative   06/29/2021 Initial Diagnosis   Malignant neoplasm of upper-outer quadrant of right breast in female, estrogen receptor positive ([place])    She arrived to the appointment today with her daughter Jeral Fruit.  She is a very pleasant 88 year old female patient, very active currently newly diagnosed with a right-sided breast cancer scheduled for right-sided lumpectomy on June 6.  She denies any new complaints except for the screening mammogram detected breast cancer.  She goes ballroom dancing 3 times a week, does tai chi, stays very active overall and denies any major health issues.   She has 3 children, 2 daughters live in [place] and have been very helpful  Rest of the pertinent 10 point ROS reviewed and negative  MEDICAL HISTORY:  Past Medical History:  Diagnosis Date   Anemia    Arthritis    Colon polyps    hyperplastic   Complication of anesthesia    hard to wake up   Depression    Diverticulosis    GERD (gastroesophageal reflux disease)    Hiatal hernia    Hyperlipidemia    IBS (irritable bowel syndrome)    Internal hemorrhoids    Lung nodule    Occipital neuralgia    Peripheral neuropathy     SURGICAL HISTORY: Past Surgical History:  Procedure Laterality Date   ABDOMINAL HYSTERECTOMY  1972   APPENDECTOMY  1959   CATARACT EXTRACTION Bilateral    left 03/2014, right eye 06/2016   CHOLECYSTECTOMY      SOCIAL HISTORY: Social History   Socioeconomic History   Marital status: Divorced    Spouse name: Not on file   Number of children: 3   Years of education: Masters   Highest education level: Not on file  Occupational History   Occupation: retired  Tobacco Use   Smoking status: Former    Packs/day: 1.00    Types: Cigarettes    Quit date: 10/13/2016    Years since quitting: 4.7   Smokeless tobacco: Never   Tobacco comments:    12/31/18  nicotene gum, lozenges  Vaping Use   Vaping Use: Never used  Substance and Sexual Activity   Alcohol use: Yes    Alcohol/week: 0.0 standard drinks    Comment: less than 1/2 glass wine daily   Drug use: No  Sexual activity: Not on file  Other Topics Concern   Not on file  Social History Narrative   12/31/18 Lives alone   Caffeine- coffee, 1- 1 1/2 cups daily   Social Determinants of Health   Financial Resource Strain: Not on file  Food Insecurity: Not on file  Transportation Needs: Not on file  Physical Activity: Not on file  Stress: Not on file  Social Connections: Not on file  Intimate Partner Violence: Not on file    FAMILY HISTORY: Family History  Problem Relation Age of Onset    Breast cancer Mother 49   Heart disease Father    CVA Father    Heart attack Father 33   Heart attack Paternal Grandfather    Colon cancer Neg Hx    Stomach cancer Neg Hx    Rectal cancer Neg Hx    Esophageal cancer Neg Hx     ALLERGIES:  is allergic to linzess [linaclotide], oxycodone, simvastatin, benzalkonium chloride, neosporin [neomycin-polymyxin-gramicidin], and tape.  MEDICATIONS:  Current Outpatient Medications  Medication Sig Dispense Refill   diphenhydramine-acetaminophen (TYLENOL PM) 25-500 MG TABS tablet Take 2 tablets by mouth at bedtime as needed (Sleep).     loperamide (IMODIUM) 2 MG capsule Take 1 mg by mouth every 3 (three) days.     Methylcellulose, Laxative, (CITRUCEL PO) Take 2 tablets by mouth daily.     Multiple Vitamin (MULTI VITAMIN) TABS Take 1 tablet by mouth daily. Senior woman     nicotine polacrilex (NICORETTE) 4 MG gum Take 4 mg by mouth as needed for smoking cessation.     NON FORMULARY Take 2 tablets by mouth at bedtime. [place] leg cramp pills     OVER THE COUNTER MEDICATION Take 2 tablets by mouth daily as needed (Back pain). Back and body aspirin     pantoprazole (PROTONIX) 40 MG tablet Take 40 mg by mouth daily.      polycarbophil (FIBERCON) 625 MG tablet Take 1,250 mg by mouth at bedtime.     Polyvinyl Alcohol-Povidone (REFRESH OP) Place 1 drop into both eyes daily as needed (Dry eye).     pregabalin (LYRICA) 150 MG capsule Take 150 mg by mouth daily.     Probiotic Product (ALIGN PO) Take 1 tablet by mouth daily.     rosuvastatin (CRESTOR) 10 MG tablet Take 10 mg by mouth 2 (two) times a week.  0   sertraline (ZOLOFT) 50 MG tablet Take 25 mg by mouth daily.     No current facility-administered medications for this visit.    PHYSICAL EXAMINATION: ECOG PERFORMANCE STATUS: 0 - Asymptomatic  Vitals:   07/14/21 1053  BP: (!) 156/70  Pulse: 89  Resp: 16  Temp: (!) 97.5 F (36.4 C)  SpO2: 97%   Filed Weights   07/14/21 1053  Weight: 113 lb  3.2 oz (51.3 kg)    GENERAL:alert, no distress and comfortable SKIN: skin color, texture, turgor are normal, no rashes or significant lesions EYES: normal, conjunctiva are pink and non-injected, sclera clear OROPHARYNX:no exudate, no erythema and lips, buccal mucosa, and tongue normal  NECK: supple, thyroid normal size, non-tender, without nodularity LYMPH:  no palpable lymphadenopathy in the cervical, axillary or inguinal LUNGS: clear to auscultation and percussion with normal breathing effort HEART: regular rate & rhythm and no murmurs and no lower extremity edema ABDOMEN:abdomen soft, non-tender and normal bowel sounds Musculoskeletal:no cyanosis of digits and no clubbing  PSYCH: alert & oriented x 3 with fluent speech NEURO: no focal motor/sensory  deficits BREAST: Right breast palpable mass noted measuring around 2 cm in largest dimension.  Some of this could be postbiopsy hematoma.  No palpable regional adenopathy  LABORATORY DATA:  I have reviewed the data as listed Lab Results  Component Value Date   WBC 6.4 11/18/2018   HGB 12.8 11/18/2018   HCT 38.2 11/18/2018   MCV 97.7 11/18/2018   PLT 333 11/18/2018   Lab Results  Component Value Date   NA 140 11/18/2018   K 4.4 11/18/2018   CL 102 11/18/2018   CO2 31 11/18/2018    RADIOGRAPHIC STUDIES: I have personally reviewed the radiological reports and agreed with the findings in the report.  ASSESSMENT AND PLAN:  Malignant neoplasm of upper-outer quadrant of right breast in female, estrogen receptor positive ([place]) This is a very pleasant 88 year old postmenopausal female patient with newly diagnosed right breast invasive ductal carcinoma, tubular type, grade 1, ER 95% strong, PR 40% strong, Ki-67 of 1%, HER2 negative scheduled for right breast lumpectomy on June 6 referred to medical oncology for adjuvant recommendations.  We have discussed that she overall has a very favorable profile tumor and the intent of treatment is  curative.  We have discussed about proceeding with lumpectomy followed by consideration for antiestrogen therapy.  Given her prognostics, I do not believe she will need any adjuvant chemotherapy at this time.  She understands that sometimes in rare cases, if the final pathology is very different, these recommendations might change.  We have focused our discussion on antiestrogen therapy.  We have discussed about tamoxifen, mechanism of action, adverse effects of tamoxifen including but not limited to fatigue, postmenopausal symptoms, vaginal dryness, hot flashes, increased risk of DVT/PE, endometrial cancer in women with the uterus, endometrial thickening.  A benefit from tamoxifen would be improvement in bone density apart from reducing risk of breast cancer.  We also discussed about aromatase inhibitors, mechanism of action, adverse effects including but not limited to fatigue, postmenopausal symptoms, arthralgias/myalgias, bone loss over time.  She is currently leaning towards tamoxifen since she is very active, had a hysterectomy and does not want to have any further bone loss.  I think this is a very reasonable choice.  She will return to clinic after surgery to review final pathology and we will initiate antiestrogen therapy depending on her discussion with radiation oncology as well.  Thank you for consulting [place] in the care of this patient.  Please not hesitate to contact us with any additional questions or concerns.  She also agreed to the research specimen collection study.  Total time spent: 60 minutes including history, review of records, counseling and coordination of care All questions were answered. The patient knows to call the clinic with any problems, questions or concerns.    Benay Pike, MD 07/14/21

## 2021-07-14 NOTE — Research (Signed)
[employer] 2021-05 - Specimen Collection Study to Evaluate Biomarkers in Subjects with Cancer   INTRO STUDY/CONSENTS  Patient Candice Brown was identified by MD [place] as a potential candidate for the above listed study.  This Clinical Research Nurse met with Prentice Docker, [id number], on 07/14/21 in a manner and location that ensures patient privacy to discuss participation in the above listed research study.  Patient is Accompanied by daughter [name] .  A copy of the informed consent document with embedded HIPAA language was provided to the patient.  Patient reads, speaks, and understands [place].   Patient was provided with the business card of this Nurse and encouraged to contact the research team with any questions.  Approximately 20 minutes were spent with the patient reviewing the informed consent documents.  Patient was provided the option of taking informed consent documents home to review and was encouraged to review at their convenience with their support network, including other care providers. Patient took the consent documents home to review.  Patient's surgery is on 07/18/21, will need to get specimen before then.  Patient agreed to phone call on Monday around 0900-0930 to f/u on interest and schedule consent/lab appt.  [name], RN, BSN Clinical Research Nurse I 07/14/21 11:52 AM

## 2021-07-17 ENCOUNTER — Encounter: Payer: Self-pay | Admitting: *Deleted

## 2021-07-17 ENCOUNTER — Inpatient Hospital Stay: Payer: BC Managed Care – PPO

## 2021-07-17 ENCOUNTER — Other Ambulatory Visit: Payer: Self-pay

## 2021-07-17 ENCOUNTER — Ambulatory Visit
Admission: RE | Admit: 2021-07-17 | Discharge: 2021-07-17 | Disposition: A | Discharge status: Home / Self Care | Payer: BC Managed Care – PPO | Source: Ambulatory Visit | Attending: General Surgery | Admitting: General Surgery

## 2021-07-17 DIAGNOSIS — Z170 Estrogen receptor positive status [ER+]: Secondary | ICD-10-CM

## 2021-07-17 LAB — RESEARCH LABS

## 2021-07-17 NOTE — Research (Signed)
Trial Name:  [employer] 2021-05 - Specimen Collection Study to Evaluate Biomarkers in Subjects with Cancer   Patient Candice Brown was identified by Dr Chryl Heck as a potential candidate for the above listed study.  This Clinical Research Nurse met with TRISA CRANOR, [id number] on 07/17/21 in a manner and location that ensures patient privacy to discuss participation in the above listed research study.  Patient is Unaccompanied.  Patient was previously provided with informed consent documents.  Patient confirmed they have read the informed consent documents.  As outlined in the informed consent form, this Nurse and Prentice Docker discussed the purpose of the research study, the investigational nature of the study, study procedures and requirements for study participation, potential risks and benefits of study participation, as well as alternatives to participation.  This study is not blinded or double-blinded. The patient understands participation is voluntary and they may withdraw from study participation at any time.  This study does not involve randomization.  This study does not involve an investigational drug or device. This study does not involve a placebo. Patient understands enrollment is pending full eligibility review.   Confidentiality and how the patient's information will be used as part of study participation were discussed.  Patient was informed there is reimbursement provided for their time and effort spent on trial participation.  The patient is encouraged to discuss research study participation with their insurance provider to determine what costs they may incur as part of study participation, including research related injury.    All questions were answered to patient's satisfaction.  The informed consent with embedded HIPAA language was reviewed page by page.  The patient's mental and emotional status is appropriate to provide informed consent, and the patient verbalizes an  understanding of study participation.  Patient has agreed to participate in the above listed research study and has voluntarily signed the informed consent version dated 13 Mar 2021 with embedded HIPAA language, version dated 13 Mar 2021  on 07/17/21 at 1130AM.  The patient was provided with a copy of the signed informed consent form with embedded HIPAA language for their reference.  No study specific procedures were obtained prior to the signing of the informed consent document.  Approximately 15 minutes were spent with the patient reviewing the informed consent documents.  Patient was not requested to complete a Release of Information form.  Marjie Skiff Kristian Hazzard, RN, BSN, [facility] She  Her  Hers Clinical Research Nurse [facility] Direct Dial [phone]  Pager [phone] 07/17/2021 12:13 PM

## 2021-07-17 NOTE — H&P (Signed)
REFERRING PHYSICIAN:  Reynaldo Minium   PROVIDER:  Georgianne Fick, MD   Care Team: Patient Care Team: Geoffery Lyons, MD as PCP - General    MRN: [id number] DOB: [birth year] DATE OF ENCOUNTER: 06/28/2021   Subjective    Chief Complaint: Breast Cancer       History of Present Illness: Candice Brown is a 88 y.o. female who is seen today as an office consultation at the request of Dr. Reynaldo Minium for evaluation of Breast Cancer .   Patient has a new diagnosis of right breast cancer may 2023.  She presented with an abnormal screening mammogram.  There was possible right breast distortion and possible left breast asymmetry.  Diagnostic imaging was performed.  Breast density was B.  The left asymmetry disperses with spot compression but the right breast did show a small 2 to 3 mm mass.  Ultrasound was concordant with this.  This is at 10:00 2 cm from the nipple.  The axilla was negative.  A core needle biopsy was performed that showed an invasive ductal carcinoma, grade 1, strongly ER and PR positive, HER2 negative, Ki-67 of 1%.  She presents to discuss surgery.   She has not previously had cancer before this.  Her mother had breast cancer also in her late 19s.   She likes to do ballroom dancing.  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.   Diagnostic mammogram:05/31/2021 ACR Breast Density Category b: There are scattered areas of fibroglandular density.   FINDINGS: In the left breast, the possible asymmetry noted on the current screening study disperses with spot compression imaging consistent with superimposed fibroglandular tissue. There is no underlying mass or significant residual asymmetry.   In the right breast, the possible distortion noted in the upper outer quadrant on the current screening study persists on spot compression imaging as a small, 2-3 mm, mass with subtle associated distortion, projecting in the upper outer  quadrant, middle depth.   On physical exam, no mass is palpated in the upper outer right breast.   Targeted right breast ultrasound is performed, showing a subtle, ill-defined hypoechoic mass/lesion, with associated architectural distortion, in the right breast at 10 o'clock, 2 cm the nipple, measuring 2.5 mm, consistent in size and location to the mammographic abnormality.   Sonographic evaluation of the right axilla demonstrates normal lymph nodes. There are no enlarged or abnormal lymph nodes.   IMPRESSION: 1. Suspicious small mass with associated architectural distortion in the right breast at 10 o'clock, 2 cm the nipple. Tissue sampling is recommended.   RECOMMENDATION: 1. Ultrasound-guided core needle biopsy of the small, 2 o'clock position, right breast mass. This is a subtle sonographic finding. If this cannot be confidently reproduced at the time of biopsy, stereotactic biopsy should be considered although this would also be technically challenging. If the lesion can be biopsied under ultrasound, post biopsy clip mammograms should be assessed to ensure that the sonographic lesion correlates with the mammographic abnormality.   I have discussed the findings and recommendations with the patient. If applicable, a reminder letter will be sent to the patient regarding the next appointment.   BI-RADS CATEGORY  4: Suspicious.     Pathology core needle biopsy: 06/15/2021 Breast, right, needle core biopsy INVASIVE DUCTAL CARCINOMA, TUBULAR TYPE, THE LINEAR EXTENT OF CARCINOMA IN THE LONGEST CORE: 5 MM. NOTTINGHAM SCORE: 1+2+1, GRADE 1. THERE ARE MINUTE FOCI OF MICROCALCIFICATION WITHOUT TUMOR NECROSIS.  Receptors: Estrogen Receptor: 95%, POSITIVE, STRONG STAINING INTENSITY Progesterone Receptor: 40%, POSITIVE, STRONG STAINING INTENSITY Proliferation Marker Ki67: 1% GROUP 5: HER2 **NEGATIVE**     Review of Systems: A complete review of systems was obtained from the  patient.  I have reviewed this information and discussed as appropriate with the patient.  See HPI as well for other ROS.   She has easy bruising and back/occasional neck pain.       Medical History: Past Medical History      Past Medical History:  Diagnosis Date   Anemia     Aneurysm (CMS-HCC)     Aneurysm, cerebral     Arthritis     GERD (gastroesophageal reflux disease)             Patient Active Problem List  Diagnosis   Aneurysm, cerebral   Malignant neoplasm of upper-outer quadrant of right breast in female, estrogen receptor positive (CMS-HCC)      Past Surgical History       Past Surgical History:  Procedure Laterality Date   [place]   Rotator Cuff Surgery   2008        Allergies       Allergies  Allergen Reactions   Adhesive Rash   Neosporin [Benz[place] Chloride] Rash   Oxycodone Nausea       ( made very sick)              Current Outpatient Medications on File Prior to Visit  Medication Sig Dispense Refill   sertraline (ZOLOFT) 50 MG tablet Take by mouth       aspirin 325 MG tablet Take 325 mg by mouth once daily       aspirin 81 mg tablet Take 1 tablet by mouth once daily       b complex vitamins tablet Take 1 tablet by mouth once daily       multivitamin tablet Take 1 tablet by mouth once daily. Reported on 06/14/2015         pantoprazole (PROTONIX) 40 MG DR tablet Take 1 tablet by mouth twice daily on an empty stomach   0   polycarbophil (FIBERCON) 625 mg tablet 2 tab by mouth at bedtime       pregabalin (LYRICA) 150 MG capsule 1 cap by mouth 2 times a day       rosuvastatin (CRESTOR) 10 MG tablet Take 10 mg by mouth twice a week.      0    No current facility-administered medications on file prior to visit.      Family History       Family History  Problem Relation Age of Onset   Breast cancer Mother     Coronary Artery Disease (Blocked arteries around heart) Father          Social History         Tobacco Use  Smoking Status Former   Packs/day: 1.00   Years: 60.00   Pack years: 60.00   Types: Cigarettes   Quit date: 2017   Years since quitting: 6.3  Smokeless Tobacco Never  Tobacco Comments    Start back 2014      Social History  Social History         Socioeconomic History   Marital status: Divorced  Tobacco Use   Smoking status: Former      Packs/day: 1.00      Years: 60.00  Pack years: 60.00      Types: Cigarettes      Quit date: 2017      Years since quitting: 6.3   Smokeless tobacco: Never   Tobacco comments:      Start back 2014  Vaping Use   Vaping Use: Never used  Substance and Sexual Activity   Alcohol use: Yes      Comment: 2-3oz nightly   Drug use: Never        Objective:         Vitals:    06/28/21 1005  BP: 118/64  Pulse: 71  Temp: 36.6 C (97.9 F)  SpO2: 97%  Weight: 52 kg (114 lb 9.6 oz)  Height: 160 cm (_0 )    Body mass index is 20.3 kg/m.     Gen:  No acute distress.  Well nourished and well groomed.   Neurological: Alert and oriented to person, place, and time. Coordination normal.  Head: Normocephalic and atraumatic.  Eyes: Conjunctivae are normal. Pupils are equal, round, and reactive to light. No scleral icterus.  Neck: Normal range of motion. Neck supple. No tracheal deviation or thyromegaly present.  Cardiovascular: Normal rate, regular rhythm, normal heart sounds and intact distal pulses.  Exam reveals no gallop and no friction rub.  No murmur heard. Breast: Bruising in the lateral right breast and hematoma palpable in the UOQ.  No other palpable lesions.  No nipple retraction or nipple discharge.  No LAD.  Breasts symmetric bilaterally and ptotic.   Respiratory: Effort normal.  No respiratory distress. No chest wall tenderness. Breath sounds normal.  No wheezes, rales or rhonchi.  GI: Soft. Bowel sounds are normal. The abdomen is soft and nontender.  There is no rebound and no guarding.  Musculoskeletal:  Normal range of motion. Extremities are nontender.  Lymphadenopathy: No cervical, preauricular, postauricular or axillary adenopathy is present Skin: Skin is warm and dry. No rash noted. No diaphoresis. No erythema. No pallor. No clubbing, cyanosis, or edema.   Psychiatric: Normal mood and affect. Behavior is normal. Judgment and thought content normal.        Assessment and Plan:        ICD-10-CM    1. Malignant neoplasm of upper-outer quadrant of right breast in female, estrogen receptor positive (CMS-HCC)  C50.411      Z17.0           Patient has a new diagnosis of clinical T1a right breast cancer.  This has an extremely favorable prognostic profile.  Based on the extremely small size of the tumor, the prognostic profile, and the patient's age, I would plan a lumpectomy alone without a sentinel node biopsy.  I will still refer the patient to medical oncology.  I will hold off on referral to radiation oncology depending on the final pathology.   Discussed that the surgery would be a seed localized lumpectomy.   The surgical procedure was described to the patient.  I discussed the incision type and location and that we would need radiology involved on with a wire or seed marker and/or sentinel node.       The risks and benefits of the procedure were described to the patient and she wishes to proceed.     We discussed the risks bleeding, infection, damage to other structures, need for further procedures/surgeries.  We discussed the risk of seroma.  The patient was advised if the area in the breast in cancer, we may need to go back to surgery  for additional tissue to obtain negative margins or for a lymph node biopsy. The patient was advised that these are the most common complications, but that others can occur as well.  They were advised against taking aspirin or other anti-inflammatory agents/blood thinners the week before surgery.       Milus Height, MD FACS Surgical Oncology, General  Surgery, Trauma and [facility]

## 2021-07-17 NOTE — Research (Signed)
[employer] 2021-05 - Specimen Collection Study to Evaluate Biomarkers in Subjects with Cancer   Medical History:  High Blood Pressure  No Coronary Artery Disease No Lupus    No Rheumatoid Arthritis  No Diabetes   No      Lynch Syndrome  No  Is the patient currently taking a magnesium supplement?   No  Does the patient have a personal history of cancer (greater than 5 years ago)?  No  Does the patient have a family history of cancer in 1st or 2nd degree relatives? Yes If yes, Relationship(s) and Cancer type(s)? Mother--breast cancer  Does the patient have history of alcohol consumption? Yes   If yes, current or former? Current Number of years? 30  Drinks per week? 3  Does the patient have history of cigarette, cigar, pipe, or chewing tobacco use?  Yes  If yes, current for former? Former If yes, type (Cigarette, cigar, pipe, and/or chewing tobacco)? Cigaretter   If former, year stopped? 2021 Number of years? 60 Packs/number/containers per day? 1   This Nurse has reviewed this patient's inclusion and exclusion criteria and confirmed RASHEEDAH REIS is eligible for study participation.  Patient will continue with enrollment.  Eligibility confirmed by treating investigator, who also agrees that patient should proceed with enrollment.  Marjie Skiff Elaynah Virginia, RN, BSN, [facility] She  Her  Hers Clinical Research Nurse [facility] Direct Dial [phone]  Pager [phone] 07/17/2021 12:16 PM

## 2021-07-17 NOTE — Research (Signed)
[employer] 2021-05 - Specimen Collection Study to Evaluate Biomarkers in Subjects with Cancer   This Nurse has reviewed this patient's inclusion and exclusion criteria as a second review and confirms Candice Brown is eligible for study participation.  Patient may continue with enrollment.  [name], RN, BSN Clinical Research Nurse I 07/17/21 12:43 PM

## 2021-07-18 ENCOUNTER — Ambulatory Visit (HOSPITAL_BASED_OUTPATIENT_CLINIC_OR_DEPARTMENT_OTHER)
Admission: RE | Disposition: A | Discharge status: Home / Self Care | Payer: Self-pay | Source: Home / Self Care | Attending: General Surgery

## 2021-07-18 ENCOUNTER — Other Ambulatory Visit: Payer: Self-pay

## 2021-07-18 ENCOUNTER — Ambulatory Visit (HOSPITAL_BASED_OUTPATIENT_CLINIC_OR_DEPARTMENT_OTHER)
Admission: RE | Admit: 2021-07-18 | Discharge: 2021-07-18 | Disposition: A | Discharge status: Home / Self Care | Payer: Medicare Other | Attending: General Surgery | Admitting: General Surgery

## 2021-07-18 ENCOUNTER — Ambulatory Visit (HOSPITAL_BASED_OUTPATIENT_CLINIC_OR_DEPARTMENT_OTHER): Payer: Medicare Other | Admitting: Anesthesiology

## 2021-07-18 ENCOUNTER — Ambulatory Visit
Admission: RE | Admit: 2021-07-18 | Discharge: 2021-07-18 | Disposition: A | Discharge status: Home / Self Care | Payer: Medicare Other | Source: Ambulatory Visit | Attending: General Surgery | Admitting: General Surgery

## 2021-07-18 ENCOUNTER — Encounter (HOSPITAL_BASED_OUTPATIENT_CLINIC_OR_DEPARTMENT_OTHER): Payer: Self-pay | Admitting: General Surgery

## 2021-07-18 DIAGNOSIS — Z87891 Personal history of nicotine dependence: Secondary | ICD-10-CM | POA: Diagnosis not present

## 2021-07-18 DIAGNOSIS — Z170 Estrogen receptor positive status [ER+]: Secondary | ICD-10-CM | POA: Insufficient documentation

## 2021-07-18 DIAGNOSIS — C50411 Malignant neoplasm of upper-outer quadrant of right female breast: Secondary | ICD-10-CM

## 2021-07-18 DIAGNOSIS — M1990 Unspecified osteoarthritis, unspecified site: Secondary | ICD-10-CM

## 2021-07-18 DIAGNOSIS — D630 Anemia in neoplastic disease: Secondary | ICD-10-CM | POA: Diagnosis not present

## 2021-07-18 DIAGNOSIS — Z01818 Encounter for other preprocedural examination: Secondary | ICD-10-CM

## 2021-07-18 DIAGNOSIS — K219 Gastro-esophageal reflux disease without esophagitis: Secondary | ICD-10-CM | POA: Diagnosis not present

## 2021-07-18 DIAGNOSIS — K449 Diaphragmatic hernia without obstruction or gangrene: Secondary | ICD-10-CM | POA: Insufficient documentation

## 2021-07-18 DIAGNOSIS — F32A Depression, unspecified: Secondary | ICD-10-CM | POA: Diagnosis not present

## 2021-07-18 DIAGNOSIS — Z803 Family history of malignant neoplasm of breast: Secondary | ICD-10-CM | POA: Diagnosis not present

## 2021-07-18 DIAGNOSIS — D649 Anemia, unspecified: Secondary | ICD-10-CM | POA: Insufficient documentation

## 2021-07-18 HISTORY — PX: BREAST LUMPECTOMY: SHX2

## 2021-07-18 HISTORY — PX: BREAST LUMPECTOMY WITH RADIOACTIVE SEED LOCALIZATION: SHX6424

## 2021-07-18 HISTORY — DX: Other complications of anesthesia, initial encounter: T88.59XA

## 2021-07-18 SURGERY — BREAST LUMPECTOMY WITH RADIOACTIVE SEED LOCALIZATION
Anesthesia: General | Site: Breast | Laterality: Right

## 2021-07-18 MED ORDER — BUPIVACAINE HCL (PF) 0.25 % IJ SOLN
INTRAMUSCULAR | Status: AC
  Filled 2021-07-18: qty 30

## 2021-07-18 MED ORDER — LACTATED RINGERS IV SOLN: INTRAVENOUS | Status: DC

## 2021-07-18 MED ORDER — DEXAMETHASONE SODIUM PHOSPHATE 10 MG/ML IJ SOLN
INTRAMUSCULAR | Status: AC
  Filled 2021-07-18: qty 1

## 2021-07-18 MED ORDER — ACETAMINOPHEN 500 MG PO TABS
ORAL_TABLET | ORAL | Status: AC
  Filled 2021-07-18: qty 2

## 2021-07-18 MED ORDER — PROPOFOL 10 MG/ML IV BOLUS
INTRAVENOUS | Status: AC
  Filled 2021-07-18: qty 20

## 2021-07-18 MED ORDER — GLYCOPYRROLATE PF 0.2 MG/ML IJ SOSY
PREFILLED_SYRINGE | INTRAMUSCULAR | Status: AC
  Filled 2021-07-18: qty 1

## 2021-07-18 MED ORDER — ONDANSETRON HCL 4 MG/2ML IJ SOLN
INTRAMUSCULAR | Status: DC | PRN
  Administered 2021-07-18: 4 mg via INTRAVENOUS

## 2021-07-18 MED ORDER — LIDOCAINE-EPINEPHRINE (PF) 1 %-1:200000 IJ SOLN
INTRAMUSCULAR | Status: DC | PRN
  Administered 2021-07-18: 40 mL

## 2021-07-18 MED ORDER — DEXAMETHASONE SODIUM PHOSPHATE 4 MG/ML IJ SOLN
INTRAMUSCULAR | Status: DC | PRN
  Administered 2021-07-18: 5 mg via INTRAVENOUS

## 2021-07-18 MED ORDER — ACETAMINOPHEN 500 MG PO TABS
1000.0000 mg | ORAL_TABLET | ORAL | Status: AC
  Administered 2021-07-18: 1000 mg via ORAL

## 2021-07-18 MED ORDER — HYDROCODONE-ACETAMINOPHEN 5-325 MG PO TABS: 1.0000 | ORAL_TABLET | Freq: Four times a day (QID) | ORAL | 0 refills | Status: DC | PRN

## 2021-07-18 MED ORDER — FENTANYL CITRATE (PF) 100 MCG/2ML IJ SOLN
INTRAMUSCULAR | Status: AC
  Filled 2021-07-18: qty 2

## 2021-07-18 MED ORDER — GLYCOPYRROLATE 0.2 MG/ML IJ SOLN
INTRAMUSCULAR | Status: DC | PRN
  Administered 2021-07-18: .2 mg via INTRAVENOUS

## 2021-07-18 MED ORDER — CEFAZOLIN SODIUM-DEXTROSE 2-4 GM/100ML-% IV SOLN
2.0000 g | INTRAVENOUS | Status: AC
  Administered 2021-07-18: 2 g via INTRAVENOUS

## 2021-07-18 MED ORDER — FENTANYL CITRATE (PF) 100 MCG/2ML IJ SOLN: 25.0000 ug | INTRAMUSCULAR | Status: DC | PRN

## 2021-07-18 MED ORDER — LIDOCAINE 2% (20 MG/ML) 5 ML SYRINGE
INTRAMUSCULAR | Status: AC
  Filled 2021-07-18: qty 5

## 2021-07-18 MED ORDER — PROPOFOL 10 MG/ML IV BOLUS
INTRAVENOUS | Status: DC | PRN
  Administered 2021-07-18: 100 mg via INTRAVENOUS

## 2021-07-18 MED ORDER — EPHEDRINE SULFATE (PRESSORS) 50 MG/ML IJ SOLN
INTRAMUSCULAR | Status: DC | PRN
  Administered 2021-07-18: 15 mg via INTRAVENOUS

## 2021-07-18 MED ORDER — LIDOCAINE HCL (CARDIAC) PF 100 MG/5ML IV SOSY
PREFILLED_SYRINGE | INTRAVENOUS | Status: DC | PRN
  Administered 2021-07-18: 50 mg via INTRAVENOUS

## 2021-07-18 MED ORDER — CHLORHEXIDINE GLUCONATE CLOTH 2 % EX PADS: 6.0000 | MEDICATED_PAD | Freq: Once | CUTANEOUS | Status: DC

## 2021-07-18 MED ORDER — FENTANYL CITRATE (PF) 100 MCG/2ML IJ SOLN
INTRAMUSCULAR | Status: DC | PRN
  Administered 2021-07-18: 50 ug via INTRAVENOUS

## 2021-07-18 MED ORDER — SUCCINYLCHOLINE CHLORIDE 200 MG/10ML IV SOSY
PREFILLED_SYRINGE | INTRAVENOUS | Status: AC
  Filled 2021-07-18: qty 10

## 2021-07-18 MED ORDER — CEFAZOLIN SODIUM-DEXTROSE 2-4 GM/100ML-% IV SOLN
INTRAVENOUS | Status: AC
  Filled 2021-07-18: qty 100

## 2021-07-18 MED ORDER — DROPERIDOL 2.5 MG/ML IJ SOLN: 0.6250 mg | Freq: Once | INTRAMUSCULAR | Status: DC | PRN

## 2021-07-18 MED ORDER — PHENYLEPHRINE 80 MCG/ML (10ML) SYRINGE FOR IV PUSH (FOR BLOOD PRESSURE SUPPORT)
PREFILLED_SYRINGE | INTRAVENOUS | Status: AC
  Filled 2021-07-18: qty 10

## 2021-07-18 MED ORDER — ONDANSETRON HCL 4 MG/2ML IJ SOLN
INTRAMUSCULAR | Status: AC
  Filled 2021-07-18: qty 2

## 2021-07-18 MED ORDER — EPHEDRINE 5 MG/ML INJ
INTRAVENOUS | Status: AC
  Filled 2021-07-18: qty 5

## 2021-07-18 MED ORDER — ATROPINE SULFATE 0.4 MG/ML IV SOLN
INTRAVENOUS | Status: AC
  Filled 2021-07-18: qty 1

## 2021-07-18 MED ORDER — LIDOCAINE-EPINEPHRINE (PF) 1 %-1:200000 IJ SOLN
INTRAMUSCULAR | Status: AC
  Filled 2021-07-18: qty 30

## 2021-07-18 SURGICAL SUPPLY — 55 items
ADH SKN CLS APL DERMABOND .7 (GAUZE/BANDAGES/DRESSINGS) ×1
APL PRP STRL LF DISP 70% ISPRP (MISCELLANEOUS) ×1
BINDER BREAST LRG (GAUZE/BANDAGES/DRESSINGS) IMPLANT
BINDER BREAST MEDIUM (GAUZE/BANDAGES/DRESSINGS) IMPLANT
BINDER BREAST XLRG (GAUZE/BANDAGES/DRESSINGS) IMPLANT
BINDER BREAST XXLRG (GAUZE/BANDAGES/DRESSINGS) IMPLANT
BLADE SURG 10 STRL SS (BLADE) ×3 IMPLANT
BLADE SURG 15 STRL LF DISP TIS (BLADE) IMPLANT
BLADE SURG 15 STRL SS (BLADE) ×2
CANISTER SUC SOCK COL 7IN (MISCELLANEOUS) IMPLANT
CANISTER SUCT 1200ML W/VALVE (MISCELLANEOUS) IMPLANT
CHLORAPREP W/TINT 26 (MISCELLANEOUS) ×3 IMPLANT
CLIP TI LARGE 6 (CLIP) ×3 IMPLANT
CLIP TI MEDIUM 6 (CLIP) IMPLANT
COVER BACK TABLE 60X90IN (DRAPES) ×3 IMPLANT
COVER MAYO STAND STRL (DRAPES) ×3 IMPLANT
COVER PROBE W GEL 5X96 (DRAPES) ×3 IMPLANT
DERMABOND ADVANCED (GAUZE/BANDAGES/DRESSINGS) ×1
DERMABOND ADVANCED .7 DNX12 (GAUZE/BANDAGES/DRESSINGS) ×2 IMPLANT
DRAPE LAPAROSCOPIC ABDOMINAL (DRAPES) ×3 IMPLANT
DRAPE UTILITY XL STRL (DRAPES) ×3 IMPLANT
ELECT COATED BLADE 2.86 ST (ELECTRODE) ×3 IMPLANT
ELECT REM PT RETURN 9FT ADLT (ELECTROSURGICAL) ×2
ELECTRODE REM PT RTRN 9FT ADLT (ELECTROSURGICAL) ×2 IMPLANT
GAUZE SPONGE 4X4 12PLY STRL LF (GAUZE/BANDAGES/DRESSINGS) ×3 IMPLANT
GLOVE BIO SURGEON STRL SZ 6 (GLOVE) ×3 IMPLANT
GLOVE BIOGEL PI IND STRL 6.5 (GLOVE) ×2 IMPLANT
GLOVE BIOGEL PI IND STRL 7.0 (GLOVE) IMPLANT
GLOVE BIOGEL PI INDICATOR 6.5 (GLOVE) ×1
GLOVE BIOGEL PI INDICATOR 7.0 (GLOVE) ×2
GOWN STRL REUS W/ TWL LRG LVL3 (GOWN DISPOSABLE) ×2 IMPLANT
GOWN STRL REUS W/TWL 2XL LVL3 (GOWN DISPOSABLE) ×3 IMPLANT
GOWN STRL REUS W/TWL LRG LVL3 (GOWN DISPOSABLE) ×2
KIT MARKER MARGIN INK (KITS) ×3 IMPLANT
LIGHT WAVEGUIDE WIDE FLAT (MISCELLANEOUS) IMPLANT
NDL HYPO 25X1 1.5 SAFETY (NEEDLE) ×2 IMPLANT
NEEDLE HYPO 25X1 1.5 SAFETY (NEEDLE) ×2 IMPLANT
NS IRRIG 1000ML POUR BTL (IV SOLUTION) ×2 IMPLANT
PACK BASIN DAY SURGERY FS (CUSTOM PROCEDURE TRAY) ×3 IMPLANT
PENCIL SMOKE EVACUATOR (MISCELLANEOUS) ×3 IMPLANT
SLEEVE SCD COMPRESS KNEE MED (STOCKING) ×3 IMPLANT
SPIKE FLUID TRANSFER (MISCELLANEOUS) IMPLANT
SPONGE T-LAP 18X18 ~~LOC~~+RFID (SPONGE) ×3 IMPLANT
STRIP CLOSURE SKIN 1/2X4 (GAUZE/BANDAGES/DRESSINGS) ×3 IMPLANT
SUT MNCRL AB 4-0 PS2 18 (SUTURE) ×3 IMPLANT
SUT SILK 2 0 SH (SUTURE) IMPLANT
SUT VIC AB 2-0 SH 27 (SUTURE) ×2
SUT VIC AB 2-0 SH 27XBRD (SUTURE) ×2 IMPLANT
SUT VIC AB 3-0 SH 27 (SUTURE) ×2
SUT VIC AB 3-0 SH 27X BRD (SUTURE) ×2 IMPLANT
SYR CONTROL 10ML LL (SYRINGE) ×3 IMPLANT
TOWEL GREEN STERILE FF (TOWEL DISPOSABLE) ×3 IMPLANT
TRAY FAXITRON CT DISP (TRAY / TRAY PROCEDURE) ×3 IMPLANT
TUBE CONNECTING 20X1/4 (TUBING) ×1 IMPLANT
YANKAUER SUCT BULB TIP NO VENT (SUCTIONS) IMPLANT

## 2021-07-18 NOTE — Anesthesia Postprocedure Evaluation (Signed)
Anesthesia Post Note  Patient: Candice Brown  Procedure(s) Performed: RIGHT BREAST LUMPECTOMY WITH RADIOACTIVE SEED LOCALIZATION (Right: Breast)     Patient location during evaluation: PACU Anesthesia Type: General Level of consciousness: sedated and patient cooperative Pain management: pain level controlled Vital Signs Assessment: post-procedure vital signs reviewed and stable Respiratory status: spontaneous breathing Cardiovascular status: stable Anesthetic complications: no   No notable events documented.  Last Vitals:  Vitals:   07/18/21 1145 07/18/21 1215  BP: (!) 133/54 129/86  Pulse: 71 84  Resp: 12 16  Temp:  36.4 C  SpO2: 96% 95%    Last Pain:  Vitals:   07/18/21 1215  TempSrc:   PainSc: 0-No pain                 Nolon Nations

## 2021-07-18 NOTE — Anesthesia Procedure Notes (Signed)
Procedure Name: LMA Insertion Date/Time: 07/18/2021 10:21 AM Performed by: Willa Frater, CRNA Pre-anesthesia Checklist: Patient identified, Emergency Drugs available, Suction available and Patient being monitored Patient Re-evaluated:Patient Re-evaluated prior to induction Oxygen Delivery Method: Circle system utilized Preoxygenation: Pre-oxygenation with 100% oxygen Induction Type: IV induction Ventilation: Mask ventilation without difficulty LMA: LMA inserted LMA Size: 3.0 Number of attempts: 1 Airway Equipment and Method: Bite block Placement Confirmation: positive ETCO2 Tube secured with: Tape Dental Injury: Teeth and Oropharynx as per pre-operative assessment

## 2021-07-18 NOTE — Discharge Instructions (Addendum)
[place] Office Phone Number [phone]  BREAST BIOPSY/ PARTIAL MASTECTOMY: POST OP INSTRUCTIONS  Always review your discharge instruction sheet given to you by the facility where your surgery was performed.  IF YOU HAVE DISABILITY OR FAMILY LEAVE FORMS, YOU MUST BRING THEM TO THE OFFICE FOR PROCESSING.  DO NOT GIVE THEM TO YOUR DOCTOR.  Take 2 tylenol (acetominophen) three times a day for 3 days.  If you still have pain, add ibuprofen with food in between if able to take this (if you have kidney issues or stomach issues, do not take ibuprofen).  If both of those are not enough, add the narcotic pain pill.  If you find you are needing a lot of this overnight after surgery, call the next morning for a refill.    Prescriptions will not be filled after 5pm or on week-ends. Take your usually prescribed medications unless otherwise directed You should eat very light the first 24 hours after surgery, such as soup, crackers, pudding, etc.  Resume your normal diet the day after surgery. Most patients will experience some swelling and bruising in the breast.  Ice packs and a good support bra will help.  Swelling and bruising can take several days to resolve.  It is common to experience some constipation if taking pain medication after surgery.  Increasing fluid intake and taking a stool softener will usually help or prevent this problem from occurring.  A mild laxative (Milk of Magnesia or Miralax) should be taken according to package directions if there are no bowel movements after 48 hours. Unless discharge instructions indicate otherwise, you may remove your bandages 48 hours after surgery, and you may shower at that time.  You may have steri-strips (small skin tapes) in place directly over the incision.  These strips should be left on the skin at least for for 7-10 days.    ACTIVITIES:  You may resume regular daily activities (gradually increasing) beginning the next day.  Wearing a  good support bra or sports bra (or the breast binder) minimizes pain and swelling.  You may have sexual intercourse when it is comfortable. No heavy lifting for 1-2 weeks (not over around 10 pounds).  You may drive when you no longer are taking prescription pain medication, you can comfortably wear a seatbelt, and you can safely maneuver your car and apply brakes. RETURN TO WORK:  __________3-14 days depending on job. _______________ Dennis Bast should see your doctor in the office for a follow-up appointment approximately two weeks after your surgery.  Your doctor's nurse will typically make your follow-up appointment when she calls you with your pathology report.  Expect your pathology report 3-4 business days after your surgery.  You may call to check if you do not hear from [place] after three days.   WHEN TO CALL YOUR DOCTOR: Fever over 101.0 Nausea and/or vomiting. Extreme swelling or bruising. Continued bleeding from incision. Increased pain, redness, or drainage from the incision.  The clinic staff is available to answer your questions during regular business hours.  Please don't hesitate to call and ask to speak to one of the nurses for clinical concerns.  If you have a medical emergency, go to the nearest emergency room or call 911.  A surgeon from [facility] is always on call at the hospital.  For further questions, please visit [email or web address]   Post Anesthesia Home Care Instructions  Activity: Get plenty of rest for the remainder of the day. A responsible individual must stay with  you for 24 hours following the procedure.  For the next 24 hours, DO NOT: -Drive a car -Paediatric nurse -Drink alcoholic beverages -Take any medication unless instructed by your physician -Make any legal decisions or sign important papers.  Meals: Start with liquid foods such as gelatin or soup. Progress to regular foods as tolerated. Avoid greasy, spicy, heavy foods. If nausea  and/or vomiting occur, drink only clear liquids until the nausea and/or vomiting subsides. Call your physician if vomiting continues.  Special Instructions/Symptoms: Your throat may feel dry or sore from the anesthesia or the breathing tube placed in your throat during surgery. If this causes discomfort, gargle with warm salt water. The discomfort should disappear within 24 hours.  Next dose of Tylenol can be taken today at 3pm.

## 2021-07-18 NOTE — Transfer of Care (Signed)
Immediate Anesthesia Transfer of Care Note  Patient: Candice Brown  Procedure(s) Performed: RIGHT BREAST LUMPECTOMY WITH RADIOACTIVE SEED LOCALIZATION (Right: Breast)  Patient Location: PACU  Anesthesia Type:General  Level of Consciousness: awake, alert , oriented, drowsy and patient cooperative  Airway & Oxygen Therapy: Patient Spontanous Breathing and Patient connected to face mask oxygen  Post-op Assessment: Report given to RN and Post -op Vital signs reviewed and stable  Post vital signs: Reviewed and stable  Last Vitals:  Vitals Value Taken Time  BP    Temp    Pulse 68 07/18/21 1114  Resp 14 07/18/21 1114  SpO2 89 % 07/18/21 1114  Vitals shown include unvalidated device data.  Last Pain:  Vitals:   07/18/21 0844  TempSrc: Oral  PainSc: 0-No pain         Complications: No notable events documented.

## 2021-07-18 NOTE — Interval H&P Note (Signed)
History and Physical Interval Note:  07/18/2021 9:53 AM  Candice Brown  has presented today for surgery, with the diagnosis of RIGHT BREAST CANCER.  The various methods of treatment have been discussed with the patient and family. After consideration of risks, benefits and other options for treatment, the patient has consented to  Procedure(s): RIGHT BREAST LUMPECTOMY WITH RADIOACTIVE SEED LOCALIZATION (Right) as a surgical intervention.  The patient's history has been reviewed, patient examined, no change in status, stable for surgery.  I have reviewed the patient's chart and labs.  Questions were answered to the patient's satisfaction.     Stark Klein

## 2021-07-18 NOTE — Op Note (Signed)
Right Breast Radioactive seed localized lumpectomy  Indications: This patient presents with history of right breast cancer, upper outer quadrant, cT1a receptors +/+ strong staining intensity  Pre-operative Diagnosis: right breast cancer  Post-operative Diagnosis: Same  Surgeon: Stark Klein   Anesthesia: General endotracheal anesthesia  ASA Class: 3  Procedure Details  The patient was seen in the Holding Room. The risks, benefits, complications, treatment options, and expected outcomes were discussed with the patient. The possibilities of bleeding, infection, the need for additional procedures, failure to diagnose a condition, and creating a complication requiring other procedures or operations were discussed with the patient. The patient concurred with the proposed plan, giving informed consent.  The site of surgery properly noted/marked. The patient was taken to Operating Room # 1, identified, and the procedure verified as right breast seed localized lumpectomy.  The right breast and chest were prepped and draped in standard fashion. A superolateral curvilinear incision was made near the previously placed radioactive seed.  Dissection was carried down around the point of maximum signal intensity. The cautery was used to perform the dissection.   The specimen was inked with the margin marker paint kit.    Specimen radiography confirmed inclusion of the mammographic lesion, the clip, and the seed.  The background signal in the breast was zero.  Hemostasis was achieved with cautery.  The cavity was marked with clips on each border other than the anterior border.  A 2-0 vicryl was placed in the deeper tissue of the breast to minimize the defect.  The wound was irrigated and closed with 3-0 vicryl interrupted deep dermal sutures and 4-0 monocryl running subcuticular suture.      Sterile dressings were applied. At the end of the operation, all sponge, instrument, and needle counts were correct.    Findings: Seed, clip in specimen.  Posterior margin is pectoralis, anterior margin is skin.    Estimated Blood Loss:  min         Specimens: right breast tissue with seed         Complications:  None; patient tolerated the procedure well.         Disposition: PACU - hemodynamically stable.         Condition: stable

## 2021-07-18 NOTE — Anesthesia Preprocedure Evaluation (Addendum)
Anesthesia Evaluation  Patient identified by MRN, date of birth, ID band Patient awake    Reviewed: Allergy & Precautions, NPO status , Patient's Chart, lab work & pertinent test results  History of Anesthesia Complications (+) PROLONGED EMERGENCE and history of anesthetic complications  Airway Mallampati: II  TM Distance: >3 FB Neck ROM: Full    Dental no notable dental hx. (+) Dental Advisory Given, Teeth Intact   Pulmonary neg pulmonary ROS, former smoker,    Pulmonary exam normal breath sounds clear to auscultation       Cardiovascular negative cardio ROS Normal cardiovascular exam Rhythm:Regular Rate:Normal     Neuro/Psych PSYCHIATRIC DISORDERS Depression    GI/Hepatic Neg liver ROS, hiatal hernia, GERD  ,  Endo/Other  negative endocrine ROS  Renal/GU negative Renal ROS     Musculoskeletal  (+) Arthritis ,   Abdominal   Peds  Hematology  (+) Blood dyscrasia, anemia ,   Anesthesia Other Findings   Reproductive/Obstetrics                            Anesthesia Physical Anesthesia Plan  ASA: 3  Anesthesia Plan: General   Post-op Pain Management: Tylenol PO (pre-op)*   Induction: Intravenous  PONV Risk Score and Plan: 3 and Ondansetron, Dexamethasone, Treatment may vary due to age or medical condition and Midazolam  Airway Management Planned: LMA  Additional Equipment: None  Intra-op Plan:   Post-operative Plan: Extubation in OR  Informed Consent: I have reviewed the patients History and Physical, chart, labs and discussed the procedure including the risks, benefits and alternatives for the proposed anesthesia with the patient or authorized representative who has indicated his/her understanding and acceptance.     Dental advisory given  Plan Discussed with: CRNA  Anesthesia Plan Comments:       Anesthesia Quick Evaluation

## 2021-07-19 ENCOUNTER — Encounter (HOSPITAL_BASED_OUTPATIENT_CLINIC_OR_DEPARTMENT_OTHER): Payer: Self-pay | Admitting: General Surgery

## 2021-07-19 LAB — SURGICAL PATHOLOGY

## 2021-07-19 NOTE — Addendum Note (Signed)
Addendum  created 07/19/21 1256 by Ezequiel Kayser, CRNA   Charge Capture section accepted

## 2021-07-21 ENCOUNTER — Telehealth: Payer: Self-pay | Admitting: Radiation Oncology

## 2021-07-21 ENCOUNTER — Inpatient Hospital Stay: Payer: BC Managed Care – PPO | Admitting: Licensed Clinical Social Worker

## 2021-07-21 ENCOUNTER — Other Ambulatory Visit: Payer: Self-pay | Admitting: *Deleted

## 2021-07-21 DIAGNOSIS — C50411 Malignant neoplasm of upper-outer quadrant of right female breast: Secondary | ICD-10-CM

## 2021-07-21 NOTE — Progress Notes (Signed)
[place] Work  Initial Assessment   Candice Brown is a 88 y.o. year old female contacted by phone. Clinical Social Work was referred by  new patient protocol  for assessment of psychosocial needs.   SDOH (Social Determinants of Health) assessments performed: Yes SDOH Interventions    Flowsheet Row Most Recent Value  SDOH Interventions   Housing Interventions Intervention Not Indicated  Stress Interventions Intervention Not Indicated  Transportation Interventions Intervention Not Indicated       SDOH Screenings   Alcohol Screen: Not on file  Depression (QAE4-9): Not on file  Financial Resource Strain: Not on file  Food Insecurity: Not on file  Housing: Low Risk  (07/21/2021)   Housing    Last Housing Risk Score: 0  Physical Activity: Not on file  Social Connections: Not on file  Stress: No Stress Concern Present (07/21/2021)   [place]    Feeling of Stress : Not at all  Tobacco Use: Medium Risk (07/19/2021)   Patient History    Smoking Tobacco Use: Former    Smokeless Tobacco Use: Never    Passive Exposure: Not on file  Transportation Needs: Unknown (07/21/2021)   PRAPARE - Transportation    Lack of Transportation (Medical): No    Lack of Transportation (Non-Medical): Not on file     Distress Screen completed: No     No data to display            Family/Social Information:  Housing Arrangement: patient lives with daughter at [facility] . Another daughter also lives in [place] Family members/support persons in your life? Family and Friends Transportation concerns: no  Employment: Retired .  Income source: retirement income Financial concerns: No Type of concern: None Food access concerns: no Religious or spiritual practice: Not known Services Currently in place:  n/a  Coping/ Adjustment to diagnosis: Patient understands treatment plan and what happens next? yes, has had surgery  and is feeling great Concerns about diagnosis and/or treatment: I'm not especially worried about anything Patient reported stressors:  being unable to dance while healing from surgery Patient enjoys  ballroom dance, tai chi, water aerobics Current coping skills/ strengths: Communication skills , Physical Health , Special hobby/interest , and Supportive family/friends     SUMMARY: Current SDOH Barriers:  None at this time  Clinical Social Work Clinical Goal(s):  No clinical social work goals at this time  Interventions: Discussed common feeling and emotions when being diagnosed with cancer, and the importance of support during treatment Informed patient of the support team roles and support services at [facility] Provided [place] contact information and encouraged patient to call with any questions or concerns   Follow Up Plan: Patient will contact CSW with any support or resource needs Patient verbalizes understanding of plan: Yes    Ja Pistole E Mattisyn Cardona, LCSW

## 2021-07-21 NOTE — Telephone Encounter (Signed)
Called patient to schedule a consultation w. Dr. Moody. No answer, LVM for a return call.  

## 2021-07-24 NOTE — Progress Notes (Signed)
Radiation Oncology         [phone] ________________________________  Name: Candice Brown        MRN: [id number]  Date of Service: 07/26/2021 DOB: [birth year]  CX:[id number], Candice Lovett, MD  Benay Pike, MD     REFERRING PHYSICIAN: Benay Pike, MD   DIAGNOSIS: The encounter diagnosis was Malignant neoplasm of upper-outer quadrant of right breast in female, estrogen receptor positive ([place]).   HISTORY OF PRESENT ILLNESS: Candice Brown is a 88 y.o. female seen at the request of Dr. Chryl Heck for a diagnosis of right breast cancer.  The patient had a screening mammogram in February 2023 that showed possible distortion in the right breast and possible asymmetry in the left breast.  Diagnostic imaging in April 2023 with diagnostic mammography showed that the left breast asymmetry was consistent with superimposed fibroglandular tissue and no underlying mass or residual asymmetry was seen.  In the right breast however the persistence was noted of the distortion on spot compression, and by ultrasound a mass in the 10 o'clock position measuring 2.5 mm was noted, her right axilla was negative for adenopathy.  A biopsy of the right breast on 06/15/2021 was performed showing grade 1 invasive ductal carcinoma with minute foci of microcalcification, the cancer was ER/PR positive, HER2 negative with a Ki-67 of 1%.  She has undergone lumpectomy of the right breast with Dr. Barry Dienes on 07/18/2021 which showed grade 1 invasive ductal carcinoma measuring 1.2 cm.  Her resection margins were negative for carcinoma.  She is seen to discuss adjuvant therapy.    PREVIOUS RADIATION THERAPY: No   PAST MEDICAL HISTORY:  Past Medical History:  Diagnosis Date   Anemia    Arthritis    Colon polyps    hyperplastic   Complication of anesthesia    hard to wake up   Depression    Diverticulosis    GERD (gastroesophageal reflux disease)    Hiatal hernia    Hyperlipidemia    IBS (irritable bowel syndrome)    Internal  hemorrhoids    Lung nodule    Occipital neuralgia    Peripheral neuropathy        PAST SURGICAL HISTORY: Past Surgical History:  Procedure Laterality Date   ABDOMINAL HYSTERECTOMY  1972   APPENDECTOMY  1959   BREAST LUMPECTOMY WITH RADIOACTIVE SEED LOCALIZATION Right 07/18/2021   Procedure: RIGHT BREAST LUMPECTOMY WITH RADIOACTIVE SEED LOCALIZATION;  Surgeon: Stark Klein, MD;  Location: [facility];  Service: General;  Laterality: Right;   CATARACT EXTRACTION Bilateral    left 03/2014, right eye 06/2016   CHOLECYSTECTOMY       FAMILY HISTORY:  Family History  Problem Relation Age of Onset   Breast cancer Mother 27   Heart disease Father    CVA Father    Heart attack Father 24   Heart attack Paternal Grandfather    Colon cancer Neg Hx    Stomach cancer Neg Hx    Rectal cancer Neg Hx    Esophageal cancer Neg Hx      SOCIAL HISTORY:  reports that she quit smoking about 4 years ago. Her smoking use included cigarettes. She smoked an average of 1 pack per day. She has never used smokeless tobacco. She reports current alcohol use. She reports that she does not use drugs. The patient is single and lives in [place] at [facility]. She's very active there and enjoys ballroom dancing, water aerobics, and tai chi. She's accompanied by her daughter  Juliet.    ALLERGIES: Linzess [linaclotide], Oxycodone, Simvastatin, Benzalkonium chloride, Neosporin [neomycin-polymyxin-gramicidin], and Tape   MEDICATIONS:  Current Outpatient Medications  Medication Sig Dispense Refill   diphenhydramine-acetaminophen (TYLENOL PM) 25-500 MG TABS tablet Take 2 tablets by mouth at bedtime as needed (Sleep).     HYDROcodone-acetaminophen (NORCO/VICODIN) 5-325 MG tablet Take 1 tablet by mouth every 6 (six) hours as needed for moderate pain. 15 tablet 0   loperamide (IMODIUM) 2 MG capsule Take 1 mg by mouth every 3 (three) days.     Methylcellulose, Laxative, (CITRUCEL PO) Take 2  tablets by mouth daily.     Multiple Vitamin (MULTI VITAMIN) TABS Take 1 tablet by mouth daily. Senior woman     nicotine polacrilex (NICORETTE) 4 MG gum Take 4 mg by mouth as needed for smoking cessation.     NON FORMULARY Take 2 tablets by mouth at bedtime. [place] leg cramp pills     OVER THE COUNTER MEDICATION Take 2 tablets by mouth daily as needed (Back pain). Back and body aspirin     pantoprazole (PROTONIX) 40 MG tablet Take 40 mg by mouth daily.      polycarbophil (FIBERCON) 625 MG tablet Take 1,250 mg by mouth at bedtime.     Polyvinyl Alcohol-Povidone (REFRESH OP) Place 1 drop into both eyes daily as needed (Dry eye).     pregabalin (LYRICA) 150 MG capsule Take 150 mg by mouth daily.     Probiotic Product (ALIGN PO) Take 1 tablet by mouth daily.     rosuvastatin (CRESTOR) 10 MG tablet Take 10 mg by mouth 2 (two) times a week.  0   sertraline (ZOLOFT) 50 MG tablet Take 25 mg by mouth daily.     No current facility-administered medications for this visit.     REVIEW OF SYSTEMS: On review of systems, the patient reports that she is doing pretty well since surgery. She did not have any significant pain or concerns with her healing to date. She's excited to get to return to ballroom dancing.     PHYSICAL EXAM:  Wt Readings from Last 3 Encounters:  07/18/21 113 lb 15.7 oz (51.7 kg)  07/14/21 113 lb 3.2 oz (51.3 kg)  12/06/20 122 lb (55.3 kg)   Temp Readings from Last 3 Encounters:  07/18/21 97.6 F (36.4 C)  07/14/21 (!) 97.5 F (36.4 C) (Temporal)  10/24/20 98 F (36.7 C)   BP Readings from Last 3 Encounters:  07/18/21 129/86  07/14/21 (!) 156/70  12/06/20 120/62   Pulse Readings from Last 3 Encounters:  07/18/21 84  07/14/21 89  12/06/20 64    In general this is a well appearing caucasin female who appears younger than her stated age in no acute distress. She's alert and oriented x4 and appropriate throughout the examination. Cardiopulmonary assessment is negative  for acute distress and she exhibits normal effort. Bilateral breast exam is deferred.    ECOG = 0  0 - Asymptomatic (Fully active, able to carry on all predisease activities without restriction)  1 - Symptomatic but completely ambulatory (Restricted in physically strenuous activity but ambulatory and able to carry out work of a light or sedentary nature. For example, light housework, office work)  2 - Symptomatic, <50% in bed during the day (Ambulatory and capable of all self care but unable to carry out any work activities. Up and about more than 50% of waking hours)  3 - Symptomatic, >50% in bed, but not bedbound (Capable of only limited self-care,  confined to bed or chair 50% or more of waking hours)  4 - Bedbound (Completely disabled. Cannot carry on any self-care. Totally confined to bed or chair)  5 - Death   Eustace Pen MM, Creech RH, Tormey DC, et al. [phone]). "Toxicity and response criteria of the [facility]". [place] Oncol. 5 (6): 649-55    LABORATORY DATA:  Lab Results  Component Value Date   WBC 6.4 11/18/2018   HGB 12.8 11/18/2018   HCT 38.2 11/18/2018   MCV 97.7 11/18/2018   PLT 333 11/18/2018   Lab Results  Component Value Date   NA 140 11/18/2018   K 4.4 11/18/2018   CL 102 11/18/2018   CO2 31 11/18/2018   Lab Results  Component Value Date   ALT 10 11/18/2018   AST 20 11/18/2018   BILITOT 0.3 11/18/2018      RADIOGRAPHY: MM Breast Surgical Specimen  Result Date: 07/18/2021 CLINICAL DATA:  Post lumpectomy specimen radiograph EXAM: SPECIMEN RADIOGRAPH OF THE RIGHT BREAST COMPARISON:  None Available. FINDINGS: Status post excision of the right breast. The radioactive seed and biopsy marker clip are present and completely intact. IMPRESSION: Specimen radiograph of the right breast. Electronically Signed   By: Audie Pinto M.D.   On: 07/18/2021 10:50  MM RT RADIOACTIVE SEED LOC MAMMO GUIDE  Result Date: 07/17/2021 CLINICAL DATA:   88 year old female presenting for seed localization of the right breast. Patient has recently diagnosed right breast invasive ductal carcinoma. EXAM: MAMMOGRAPHIC GUIDED RADIOACTIVE SEED LOCALIZATION OF THE RIGHT BREAST COMPARISON:  None Available. FINDINGS: Patient presents for radioactive seed localization prior to right breast lumpectomy. I met with the patient and we discussed the procedure of seed localization including benefits and alternatives. We discussed the high likelihood of a successful procedure. We discussed the risks of the procedure including infection, bleeding, tissue injury and further surgery. We discussed the low dose of radioactivity involved in the procedure. Informed, written consent was given. The usual time-out protocol was performed immediately prior to the procedure. Using mammographic guidance, sterile technique, 1% lidocaine and an I-125 radioactive seed, the ribbon shaped biopsy marking clip was localized using a lateral approach. The follow-up mammogram images confirm the seed in the expected location and were marked for Dr. Barry Dienes. Follow-up survey of the patient confirms presence of the radioactive seed. Order number of I-125 seed:  [id number]. Total activity: 0.247 mCi reference Date: Jun 21, 2021 The patient tolerated the procedure well and was released from the [facility]. She was given instructions regarding seed removal. IMPRESSION: Radioactive seed localization right breast. No apparent complications. Electronically Signed   By: Audie Pinto M.D.   On: 07/17/2021 16:07      IMPRESSION/PLAN: 1. Stage IA, pT1c, cN0M0, grade 1, ER/PR posisitve invasive ductal carcinoma of the right breast. Dr. Lisbeth Renshaw discusses the pathology findings and reviews the nature of early stage breast disease.  She has done well since her surgery and had clear margins.  She is also leaning toward antiestrogen therapy.  Dr. Lisbeth Renshaw discusses the rationale for external radiotherapy to the breast   to reduce risks of local recurrence followed by antiestrogen therapy. Dr. Lisbeth Renshaw also discusses cases in which radiation may be optional for favorable cases based on final pathology.  In her case radiation would be considered optional provided that she proceed with antihormone medication.  She is going to follow up with Dr. Chryl Heck next week and make a decision. Regarding radiation however, we discussed the risks, benefits,  short, and long term effects of radiotherapy, as well as the curative intent, the delivery and logistics of radiotherapy and if she were to proceed, Dr. Lisbeth Renshaw would recommend 4 weeks of radiotherapy versus ultrahypofractionated treatment. We will see how she'd like to proceed in the next week or two.     In a visit lasting 60 minutes, greater than 50% of the time was spent face to face reviewing her case, as well as in preparation of, discussing, and coordinating the patient's care.  The above documentation reflects my direct findings during this shared patient visit. Please see the separate note by Dr. Lisbeth Renshaw on this date for the remainder of the patient's plan of care.    Carola Rhine, [facility]    **Disclaimer: This note was dictated with voice recognition software. Similar sounding words can inadvertently be transcribed and this note may contain transcription errors which may not have been corrected upon publication of note.**

## 2021-07-25 ENCOUNTER — Encounter: Payer: Self-pay | Admitting: *Deleted

## 2021-07-26 ENCOUNTER — Encounter: Payer: Self-pay | Admitting: Radiation Oncology

## 2021-07-26 ENCOUNTER — Ambulatory Visit
Admission: RE | Admit: 2021-07-26 | Discharge: 2021-07-26 | Disposition: A | Discharge status: Home / Self Care | Payer: BC Managed Care – PPO | Source: Ambulatory Visit | Attending: Radiation Oncology | Admitting: Radiation Oncology

## 2021-07-26 ENCOUNTER — Other Ambulatory Visit: Payer: Self-pay

## 2021-07-26 VITALS — BP 118/73 | HR 66 | Temp 98.4°F | Resp 18 | Wt 114.0 lb

## 2021-07-26 DIAGNOSIS — M129 Arthropathy, unspecified: Secondary | ICD-10-CM | POA: Diagnosis not present

## 2021-07-26 DIAGNOSIS — E785 Hyperlipidemia, unspecified: Secondary | ICD-10-CM | POA: Insufficient documentation

## 2021-07-26 DIAGNOSIS — Z86010 Personal history of colonic polyps: Secondary | ICD-10-CM | POA: Insufficient documentation

## 2021-07-26 DIAGNOSIS — C50411 Malignant neoplasm of upper-outer quadrant of right female breast: Secondary | ICD-10-CM | POA: Diagnosis present

## 2021-07-26 DIAGNOSIS — Z803 Family history of malignant neoplasm of breast: Secondary | ICD-10-CM | POA: Insufficient documentation

## 2021-07-26 DIAGNOSIS — Z170 Estrogen receptor positive status [ER+]: Secondary | ICD-10-CM | POA: Insufficient documentation

## 2021-07-26 DIAGNOSIS — K449 Diaphragmatic hernia without obstruction or gangrene: Secondary | ICD-10-CM | POA: Diagnosis not present

## 2021-07-26 DIAGNOSIS — K219 Gastro-esophageal reflux disease without esophagitis: Secondary | ICD-10-CM | POA: Diagnosis not present

## 2021-07-26 DIAGNOSIS — G629 Polyneuropathy, unspecified: Secondary | ICD-10-CM | POA: Insufficient documentation

## 2021-07-26 DIAGNOSIS — K589 Irritable bowel syndrome without diarrhea: Secondary | ICD-10-CM | POA: Diagnosis not present

## 2021-07-26 DIAGNOSIS — Z79899 Other long term (current) drug therapy: Secondary | ICD-10-CM | POA: Diagnosis not present

## 2021-07-26 DIAGNOSIS — Z87891 Personal history of nicotine dependence: Secondary | ICD-10-CM | POA: Insufficient documentation

## 2021-07-26 NOTE — Progress Notes (Signed)
New Breast Cancer Diagnosis: Right Breast  Did patient present with symptoms (if so, please note symptoms) or screening mammography?: Screening mammogram in February 2023 showed possible distortion in the right breast and possible asymmetry in the left breast.    Location and Extent of disease :right breast. Located at 10 o'clock position, measured 1.2 cm in greatest dimension. Adenopathy no.  Histology per Pathology Report: grade 1, Invasive Ductal Carcinoma with minute foci of microcalcification 07/18/2021  Receptor Status: ER(positive), PR (positive), Her2-neu (negative), Ki-(1%)   Surgeon and surgical plan, if any:  Dr. Barry Dienes -Right Breast lumpectomy with radioactive seed localization 07/18/2021 -Follow-up 08/14/2021   Medical oncologist, treatment if any:   Dr. Chryl Heck 08/10/2021   Family History of Breast/Ovarian/Prostate Cancer: Mom had breast cancer at 79.  Lymphedema issues, if any: No     Pain issues, if any:  No   SAFETY ISSUES: Prior radiation? No Pacemaker/ICD? No Possible current pregnancy? Hysterectomy Is the patient on methotrexate? No  Current Complaints / other details:

## 2021-07-31 ENCOUNTER — Encounter: Payer: Self-pay | Admitting: *Deleted

## 2021-08-10 ENCOUNTER — Other Ambulatory Visit: Payer: Self-pay

## 2021-08-10 ENCOUNTER — Encounter: Payer: Self-pay | Admitting: Hematology and Oncology

## 2021-08-10 ENCOUNTER — Inpatient Hospital Stay (HOSPITAL_BASED_OUTPATIENT_CLINIC_OR_DEPARTMENT_OTHER): Payer: BC Managed Care – PPO | Admitting: Hematology and Oncology

## 2021-08-10 DIAGNOSIS — C50411 Malignant neoplasm of upper-outer quadrant of right female breast: Secondary | ICD-10-CM

## 2021-08-10 DIAGNOSIS — Z170 Estrogen receptor positive status [ER+]: Secondary | ICD-10-CM

## 2021-08-10 MED ORDER — ANASTROZOLE 1 MG PO TABS: 1.0000 mg | ORAL_TABLET | Freq: Every day | ORAL | 3 refills | Status: DC

## 2021-08-10 NOTE — Assessment & Plan Note (Signed)
This is a very pleasant 89-year-old postmenopausal female patient with newly diagnosed right breast invasive ductal carcinoma, tubular type, grade 1, ER 95% strong, PR 40% strong, Ki-67 of 1%, HER2 negative scheduled for right breast lumpectomy on June 6 referred to medical oncology for adjuvant recommendations.   She is status postlumpectomy and is here for follow-up.  Final pathology showed a grade one 1.2 cm IDC, negative margins.  Prior prognostic showed ER +95% strong staining PR 40% positive strong staining.  Today we have discussed about adjuvant antiestrogen therapy.  We have discussed about both tamoxifen versus aromatase inhibitors, mechanism of action of both, adverse effects from each class.  Given family history of factor V Leiden mutation and past medical history of DVT, we have agreed that aromatase inhibitors might be the right choice for her.  We have discussed about adverse effects from this class including but not limited to postmenopausal symptoms, arthralgias, hot flashes, vaginal dryness, bone loss etc.  She will return to clinic in about 3 to 4 months and if she does not tolerate this combination well, she would rather not take any adjuvant therapy.  She is very healthy 89-year-old, has robust baseline performance status and wants to maintain her quality of life.  I think this is reasonable request. She will return to clinic in 3 to 4 months and will monitor her for toxicity. 

## 2021-08-10 NOTE — Progress Notes (Signed)
[facility] NOTE  Patient Care Team: Burnard Bunting, MD as PCP - General (Internal Medicine) Garald Balding, MD as Consulting Physician (Orthopedic Surgery) Derrill Memo, MD as Referring Physician (Neurosurgery) Mauro Kaufmann, RN as Oncology Nurse Navigator Rockwell Germany, RN as Oncology Nurse Navigator Benay Pike, MD as Consulting Physician (Hematology and Oncology)  CHIEF COMPLAINTS/PURPOSE OF CONSULTATION:  Newly diagnosed breast cancer  HISTORY OF PRESENTING ILLNESS:  Candice Brown 88 y.o. female is here because of recent diagnosis of right breast cancer  I reviewed her records extensively and collaborated the history with the patient.  SUMMARY OF ONCOLOGIC HISTORY: Oncology History  Malignant neoplasm of upper-outer quadrant of right breast in female, estrogen receptor positive ([place])  04/03/2021 Mammogram   Mammogram done on April 03, 2021 showed possible distortion in the right breast, possible asymmetry in the left breast.  Diagnostic mammogram showed suspicious small mass with associated architectural distortion in the right breast at 10:00, 2 cm from the nipple.  Ultrasound-guided core needle biopsy was recommended    06/15/2021 Pathology Results   Pathology results showed invasive ductal carcinoma, tubular type, longest core measuring 5 mm in the biopsy, grade 1, minute foci of microcalcification without tumor necrosis.  Prognostic showed ER 95% positive strong staining PR 40% positive strong staining, Ki-67 of 1% and HER2 negative   06/29/2021 Initial Diagnosis   Malignant neoplasm of upper-outer quadrant of right breast in female, estrogen receptor positive ([place])   07/24/2021 Cancer Staging   Staging form: Breast, AJCC 8th Edition - Pathologic: Stage IA (pT1c, pN0, cM0, G1, ER+, PR+, HER2-) - Signed by Hayden Pedro, PA-C on 07/24/2021 Method of lymph node assessment: Clinical Histologic grading system: 3 grade system       She is here for follow-up after her recent breast surgery.  She happened to tolerate the surgery very well.  She said she had no pain whatsoever.  She met Dr. Lisbeth Renshaw and Ebony Hail for recommendations from radiation.  She is not planning to undergo adjuvant radiation.  Rest of the pertinent 10 point ROS reviewed and negative  MEDICAL HISTORY:  Past Medical History:  Diagnosis Date   Anemia    Arthritis    Colon polyps    hyperplastic   Complication of anesthesia    hard to wake up   Depression    Diverticulosis    GERD (gastroesophageal reflux disease)    Hiatal hernia    Hyperlipidemia    IBS (irritable bowel syndrome)    Internal hemorrhoids    Lung nodule    Occipital neuralgia    Peripheral neuropathy     SURGICAL HISTORY: Past Surgical History:  Procedure Laterality Date   ABDOMINAL HYSTERECTOMY  1972   APPENDECTOMY  1959   BREAST LUMPECTOMY WITH RADIOACTIVE SEED LOCALIZATION Right 07/18/2021   Procedure: RIGHT BREAST LUMPECTOMY WITH RADIOACTIVE SEED LOCALIZATION;  Surgeon: Stark Klein, MD;  Location: [facility];  Service: General;  Laterality: Right;   CATARACT EXTRACTION Bilateral    left 03/2014, right eye 06/2016   CHOLECYSTECTOMY      SOCIAL HISTORY: Social History   Socioeconomic History   Marital status: Divorced    Spouse name: Not on file   Number of children: 3   Years of education: Masters   Highest education level: Not on file  Occupational History   Occupation: retired  Tobacco Use   Smoking status: Former    Packs/day: 1.00    Types: Cigarettes  Quit date: 10/13/2016    Years since quitting: 4.8   Smokeless tobacco: Never   Tobacco comments:    5 cigarettes per week.    12/31/18  nicotene gum, lozenges  Vaping Use   Vaping Use: Never used  Substance and Sexual Activity   Alcohol use: Yes    Alcohol/week: 0.0 standard drinks of alcohol    Comment: less than 1/2 glass wine daily   Drug use: No   Sexual activity: Not on  file  Other Topics Concern   Not on file  Social History Narrative   12/31/18 Lives alone   Caffeine- coffee, 1- 1 1/2 cups daily   Social Determinants of Health   Financial Resource Strain: Not on file  Food Insecurity: Not on file  Transportation Needs: Unknown (07/21/2021)   PRAPARE - Hydrologist (Medical): No    Lack of Transportation (Non-Medical): Not on file  Physical Activity: Not on file  Stress: No Stress Concern Present (07/21/2021)   [place]    Feeling of Stress : Not at all  Social Connections: Not on file  Intimate Partner Violence: Not on file    FAMILY HISTORY: Family History  Problem Relation Age of Onset   Breast cancer Mother 16   Heart disease Father    CVA Father    Heart attack Father 20   Heart attack Paternal Grandfather    Colon cancer Neg Hx    Stomach cancer Neg Hx    Rectal cancer Neg Hx    Esophageal cancer Neg Hx     ALLERGIES:  is allergic to linzess [linaclotide], oxycodone, simvastatin, benzalkonium chloride, neosporin [neomycin-polymyxin-gramicidin], and tape.  MEDICATIONS:  Current Outpatient Medications  Medication Sig Dispense Refill   diphenhydramine-acetaminophen (TYLENOL PM) 25-500 MG TABS tablet Take 2 tablets by mouth at bedtime as needed (Sleep).     loperamide (IMODIUM) 2 MG capsule Take 1 mg by mouth every 3 (three) days.     Methylcellulose, Laxative, (CITRUCEL PO) Take 2 tablets by mouth daily.     Multiple Vitamin (MULTI VITAMIN) TABS Take 1 tablet by mouth daily. Senior woman     nicotine polacrilex (NICORETTE) 4 MG gum Take 4 mg by mouth as needed for smoking cessation.     NON FORMULARY Take 2 tablets by mouth at bedtime. Hyland leg cramp pills     OVER THE COUNTER MEDICATION Take 2 tablets by mouth daily as needed (Back pain). Back and body aspirin     pantoprazole (PROTONIX) 40 MG tablet Take 40 mg by mouth daily.       polycarbophil (FIBERCON) 625 MG tablet Take 1,250 mg by mouth at bedtime.     Polyvinyl Alcohol-Povidone (REFRESH OP) Place 1 drop into both eyes daily as needed (Dry eye).     pregabalin (LYRICA) 150 MG capsule Take 150 mg by mouth daily.     Probiotic Product (ALIGN PO) Take 1 tablet by mouth daily.     rosuvastatin (CRESTOR) 10 MG tablet Take 10 mg by mouth 2 (two) times a week.  0   sertraline (ZOLOFT) 50 MG tablet Take 25 mg by mouth daily.     No current facility-administered medications for this visit.    PHYSICAL EXAMINATION: ECOG PERFORMANCE STATUS: 0 - Asymptomatic  Vitals:   08/10/21 1534  BP: (!) 142/70  Pulse: 73  Resp: 17  Temp: (!) 97.5 F (36.4 C)  SpO2: 93%   Filed  Weights   08/10/21 1534  Weight: 112 lb 3 oz (50.9 kg)    GENERAL:alert, no distress and comfortable  LABORATORY DATA:  I have reviewed the data as listed Lab Results  Component Value Date   WBC 6.4 11/18/2018   HGB 12.8 11/18/2018   HCT 38.2 11/18/2018   MCV 97.7 11/18/2018   PLT 333 11/18/2018   Lab Results  Component Value Date   NA 140 11/18/2018   K 4.4 11/18/2018   CL 102 11/18/2018   CO2 31 11/18/2018    RADIOGRAPHIC STUDIES: I have personally reviewed the radiological reports and agreed with the findings in the report.  ASSESSMENT AND PLAN:  Malignant neoplasm of upper-outer quadrant of right breast in female, estrogen receptor positive ([place]) This is a very pleasant 88 year old postmenopausal female patient with newly diagnosed right breast invasive ductal carcinoma, tubular type, grade 1, ER 95% strong, PR 40% strong, Ki-67 of 1%, HER2 negative scheduled for right breast lumpectomy on June 6 referred to medical oncology for adjuvant recommendations.   She is status postlumpectomy and is here for follow-up.  Final pathology showed a grade one 1.2 cm IDC, negative margins.  Prior prognostic showed ER +95% strong staining PR 40% positive strong staining.  Today we have discussed  about adjuvant antiestrogen therapy.  We have discussed about both tamoxifen versus aromatase inhibitors, mechanism of action of both, adverse effects from each class.  Given family history of factor V Leiden mutation and past medical history of DVT, we have agreed that aromatase inhibitors might be the right choice for her.  We have discussed about adverse effects from this class including but not limited to postmenopausal symptoms, arthralgias, hot flashes, vaginal dryness, bone loss etc.  She will return to clinic in about 3 to 4 months and if she does not tolerate this combination well, she would rather not take any adjuvant therapy.  She is very healthy 88 year old, has robust baseline performance status and wants to maintain her quality of life.  I think this is reasonable request. She will return to clinic in 3 to 4 months and will monitor her for toxicity.   Total time spent: 30 minutes including history, review of records, counseling and coordination of care All questions were answered. The patient knows to call the clinic with any problems, questions or concerns.    Benay Pike, MD 08/10/21

## 2021-08-11 ENCOUNTER — Encounter: Payer: Self-pay | Admitting: Radiation Oncology

## 2021-08-11 ENCOUNTER — Encounter (HOSPITAL_COMMUNITY): Payer: Self-pay

## 2021-08-16 ENCOUNTER — Encounter: Payer: Self-pay | Admitting: *Deleted

## 2021-08-16 DIAGNOSIS — C50411 Malignant neoplasm of upper-outer quadrant of right female breast: Secondary | ICD-10-CM

## 2021-08-28 NOTE — Progress Notes (Signed)
I received notification from medical oncology that the patient has decided to forgo radiation.     Carola Rhine, PAC

## 2021-09-14 ENCOUNTER — Encounter: Payer: Self-pay | Admitting: *Deleted

## 2021-09-15 ENCOUNTER — Telehealth: Payer: Self-pay | Admitting: Adult Health

## 2021-09-15 NOTE — Telephone Encounter (Signed)
Per 8/3 in basket, message has been left

## 2021-11-13 ENCOUNTER — Ambulatory Visit: Payer: Medicare Other | Admitting: Hematology and Oncology

## 2021-11-13 ENCOUNTER — Inpatient Hospital Stay: Payer: BC Managed Care – PPO | Admitting: Adult Health

## 2021-11-15 ENCOUNTER — Telehealth: Payer: Self-pay | Admitting: Internal Medicine

## 2021-11-15 NOTE — Telephone Encounter (Signed)
Pt has been taking 1/3 Immodium tab every 3 days and citrucel twice a day. Now she is having some issues with constipation and wanted to know if she could change the imodium to every 5th day. Discussed with pt that she can try taking the imodium every 5th day and drink plenty of water. Discussed with her that she could try some miralax but she states it causes her to have loose stools and does not want to try the miralax. She will call back if this does not help.

## 2021-11-15 NOTE — Telephone Encounter (Signed)
Inbound call from patient requesting a call back to discuss her medications and possibly change dosage. Please advise.

## 2021-12-03 NOTE — Progress Notes (Signed)
SURVIVORSHIP VISIT:   BRIEF ONCOLOGIC HISTORY:  Oncology History  Malignant neoplasm of upper-outer quadrant of right breast in female, estrogen receptor positive ([facility])  04/03/2021 Mammogram   Mammogram done on April 03, 2021 showed possible distortion in the right breast, possible asymmetry in the left breast.  Diagnostic mammogram showed suspicious small mass with associated architectural distortion in the right breast at 10:00, 2 cm from the nipple.  Ultrasound-guided core needle biopsy was recommended    06/15/2021 Pathology Results   Initial biopsy:IDC, g1, ER/PR+, KI67 1%, HER-2 -.     06/29/2021 Initial Diagnosis   Malignant neoplasm of upper-outer quadrant of right breast in female, estrogen receptor positive ([facility])   07/18/2021 Surgery   A. BREAST, RIGHT, LUMPECTOMY:  - Invasive ductal carcinoma, 1.2 cm, grade 1  - Resection margins are negative for carcinoma - closest are the  posterior and superior margins at 0.6 cm  - Biopsy site changes  - See oncology table    07/24/2021 Cancer Staging   Staging form: Breast, AJCC 8th Edition - Pathologic: Stage IA (pT1c, pN0, cM0, G1, ER+, PR+, HER2-) - Signed by Ronny Bacon, PA-C on 07/24/2021 Method of lymph node assessment: Clinical Histologic grading system: 3 grade system   08/10/2021 -  Anti-estrogen oral therapy   Anastrozole     INTERVAL HISTORY:  Candice Brown to review her survivorship care plan detailing her treatment course for breast cancer, as well as monitoring long-term side effects of that treatment, education regarding health maintenance, screening, and overall wellness and health promotion.     Overall, Candice Brown reports feeling quite well.  She is taking Anastrozole daily.  She noted a 10 pound weight loss after surgery.  She then came down with covid and noted she has not quite regained her appetite since that time.    REVIEW OF SYSTEMS:  Review of Systems  Constitutional:  Positive for appetite change.  Negative for chills, fatigue, fever and unexpected weight change.  HENT:   Negative for hearing loss, lump/mass and trouble swallowing.   Eyes:  Negative for eye problems and icterus.  Respiratory:  Negative for chest tightness, cough and shortness of breath.   Cardiovascular:  Negative for chest pain, leg swelling and palpitations.  Gastrointestinal:  Negative for abdominal distention, abdominal pain, constipation, diarrhea, nausea and vomiting.  Endocrine: Negative for hot flashes.  Genitourinary:  Negative for difficulty urinating.   Musculoskeletal:  Negative for arthralgias.  Skin:  Negative for itching and rash.  Neurological:  Negative for dizziness, extremity weakness, headaches and numbness.  Hematological:  Negative for adenopathy. Does not bruise/bleed easily.  Psychiatric/Behavioral:  Negative for depression. The patient is not nervous/anxious.   Breast: Denies any new nodularity, masses, tenderness, nipple changes, or nipple discharge.    ONCOLOGY TREATMENT TEAM:  1. Surgeon:  Dr. Donell Beers at [facility] 2. Medical Oncologist: Dr. Al Pimple  3. Radiation Oncologist: Dr. Mitzi Hansen    PAST MEDICAL/SURGICAL HISTORY:  Past Medical History:  Diagnosis Date   Anemia    Arthritis    Colon polyps    hyperplastic   Complication of anesthesia    hard to wake up   Depression    Diverticulosis    GERD (gastroesophageal reflux disease)    Hiatal hernia    Hyperlipidemia    IBS (irritable bowel syndrome)    Internal hemorrhoids    Lung nodule    Occipital neuralgia    Peripheral neuropathy    Past Surgical History:  Procedure Laterality  Date   ABDOMINAL HYSTERECTOMY  1972   APPENDECTOMY  1959   BREAST LUMPECTOMY WITH RADIOACTIVE SEED LOCALIZATION Right 07/18/2021   Procedure: RIGHT BREAST LUMPECTOMY WITH RADIOACTIVE SEED LOCALIZATION;  Surgeon: Almond Lint, MD;  Location: [facility];  Service: General;  Laterality: Right;   CATARACT EXTRACTION  Bilateral    left 03/2014, right eye 06/2016   CHOLECYSTECTOMY       ALLERGIES:  Allergies  Allergen Reactions   Linzess [Linaclotide] Diarrhea    Increased leg cramps   Oxycodone Nausea Only     ( made very sick)   Simvastatin Other (See Comments)    Leg cramps   Benzalkonium Chloride Rash    In neosporin   Neosporin [Neomycin-Polymyxin-Gramicidin] Rash   Tape Rash    Adhesive patches of any kind     CURRENT MEDICATIONS:  Outpatient Encounter Medications as of 12/04/2021  Medication Sig   anastrozole (ARIMIDEX) 1 MG tablet Take 1 tablet (1 mg total) by mouth daily.   diphenhydramine-acetaminophen (TYLENOL PM) 25-500 MG TABS tablet Take 2 tablets by mouth at bedtime as needed (Sleep).   loperamide (IMODIUM) 2 MG capsule Take 1 mg by mouth as needed for diarrhea or loose stools. Taking every 5 days   Methylcellulose, Laxative, (CITRUCEL PO) Take 2 tablets by mouth daily.   Multiple Vitamin (MULTI VITAMIN) TABS Take 1 tablet by mouth daily. Senior woman   nicotine polacrilex (NICORETTE) 4 MG gum Take 4 mg by mouth as needed for smoking cessation.   NON FORMULARY Take 2 tablets by mouth at bedtime. Hyland leg cramp pills   OVER THE COUNTER MEDICATION Take 2 tablets by mouth daily as needed (Back pain). Back and body aspirin   pantoprazole (PROTONIX) 40 MG tablet Take 40 mg by mouth daily.    polycarbophil (FIBERCON) 625 MG tablet Take 1,250 mg by mouth at bedtime.   Polyvinyl Alcohol-Povidone (REFRESH OP) Place 1 drop into both eyes daily as needed (Dry eye).   pregabalin (LYRICA) 150 MG capsule Take 150 mg by mouth daily.   Probiotic Product (ALIGN PO) Take 1 tablet by mouth daily.   rosuvastatin (CRESTOR) 10 MG tablet Take 10 mg by mouth 2 (two) times a week.   sertraline (ZOLOFT) 50 MG tablet Take 25 mg by mouth daily.   No facility-administered encounter medications on file as of 12/04/2021.     ONCOLOGIC FAMILY HISTORY:  Family History  Problem Relation Age of Onset    Breast cancer Mother 14   Heart disease Father    CVA Father    Heart attack Father 66   Heart attack Paternal Grandfather    Colon cancer Neg Hx    Stomach cancer Neg Hx    Rectal cancer Neg Hx    Esophageal cancer Neg Hx      SOCIAL HISTORY:  Social History   Socioeconomic History   Marital status: Divorced    Spouse name: Not on file   Number of children: 3   Years of education: Masters   Highest education level: Not on file  Occupational History   Occupation: retired  Tobacco Use   Smoking status: Former    Packs/day: 1.00    Types: Cigarettes    Quit date: 10/13/2016    Years since quitting: 5.1   Smokeless tobacco: Never   Tobacco comments:    5 cigarettes per week.    12/31/18  nicotene gum, lozenges  Vaping Use   Vaping Use: Never used  Substance and  Sexual Activity   Alcohol use: Yes    Alcohol/week: 0.0 standard drinks of alcohol    Comment: less than 1/2 glass wine daily   Drug use: No   Sexual activity: Not on file  Other Topics Concern   Not on file  Social History Narrative   12/31/18 Lives alone   Caffeine- coffee, 1- 1 1/2 cups daily   Social Determinants of Health   Financial Resource Strain: Not on file  Food Insecurity: Not on file  Transportation Needs: Unknown (07/21/2021)   PRAPARE - Administrator, Civil Service (Medical): No    Lack of Transportation (Non-Medical): Not on file  Physical Activity: Not on file  Stress: No Stress Concern Present (07/21/2021)   [employer] of Occupational Health - Occupational Stress Questionnaire    Feeling of Stress : Not at all  Social Connections: Not on file  Intimate Partner Violence: Not on file     OBSERVATIONS/OBJECTIVE:  BP (!) 142/63 (BP Location: Left Arm, Patient Position: Sitting)   Pulse 72   Temp 97.7 F (36.5 C) (Temporal)   Resp 16   Ht 5\' 2"  (1.575 m)   Wt 114 lb 14.4 oz (52.1 kg)   SpO2 98%   BMI 21.02 kg/m  GENERAL: Patient is a well appearing female in no  acute distress HEENT:  Sclerae anicteric.  Oropharynx clear and moist. No ulcerations or evidence of oropharyngeal candidiasis. Neck is supple.  NODES:  No cervical, supraclavicular, or axillary lymphadenopathy palpated.  BREAST EXAM: Right breast status postlumpectomy no sign of local recurrence left breast is benign. LUNGS:  Clear to auscultation bilaterally.  No wheezes or rhonchi. HEART:  Regular rate and rhythm. No murmur appreciated. ABDOMEN:  Soft, nontender.  Positive, normoactive bowel sounds. No organomegaly palpated. MSK:  No focal spinal tenderness to palpation. Full range of motion bilaterally in the upper extremities. EXTREMITIES:  No peripheral edema.   SKIN:  Clear with no obvious rashes or skin changes. No nail dyscrasia. NEURO:  Nonfocal. Well oriented.  Appropriate affect.   LABORATORY DATA:  None for this visit.  DIAGNOSTIC IMAGING:  None for this visit.      ASSESSMENT AND PLAN:  Ms.. Brown is a pleasant 88 y.o. female with Stage IA right breast invasive ductal carcinoma, ER+/PR+/HER2-, diagnosed in 06/2021, treated with lumpectomy and anti-estrogen therapy with Anastrozole beginning in 07/2021.  She presents to the Survivorship Clinic for our initial meeting and routine follow-up post-completion of treatment for breast cancer.    1. Stage IA right breast cancer:  Candice Brown is continuing to recover from definitive treatment for breast cancer. She will follow-up with her medical oncologist, Dr. Al Pimple in 6 months with history and physical exam per surveillance protocol.  She will continue her anti-estrogen therapy with Anastrozole. Thus far, she is tolerating the Anastrozole well, with minimal side effects. She was instructed to make Dr. Pamelia Hoit or myself aware if she begins to experience any worsening side effects of the medication and I could see her back in clinic to help manage those side effects, as needed. Her mammogram is due 03/2022; orders placed today.   Today, a  comprehensive survivorship care plan and treatment summary was reviewed with the patient today detailing her breast cancer diagnosis, treatment course, potential late/long-term effects of treatment, appropriate follow-up care with recommendations for the future, and patient education resources.  A copy of this summary, along with a letter will be sent to the patient's primary care provider via  mail/fax/In Basket message after today's visit.    2. Appetite change: I suspect this is likely secondary to age and previous COVID infection.  I recommended that she drink Ensure or boost at lunchtime since this seems to be the meal that she does not eat a lot.  Her weight however has improved and is 114 today.  3. Bone health:  Given Candice Brown's age/history of breast cancer and her current treatment regimen including anti-estrogen therapy with Anastrozole, she is at risk for bone demineralization.  Bone density was requested to occur with the patient's PCP and we are in the process of obtaining those results.  She should undergo bone density testing every 2 years which we discussed.  She was given education on specific activities to promote bone health.  4. Cancer screening:  Due to Candice Brown's history and her age, she should receive screening for skin cancers.  The information and recommendations are listed on the patient's comprehensive care plan/treatment summary and were reviewed in detail with the patient.    5. Health maintenance and wellness promotion: Candice Brown was encouraged to consume 5-7 servings of fruits and vegetables per day. We reviewed the "Nutrition Rainbow" handout.  She was also encouraged to engage exercise for 30 minutes per day most days of the week. We discussed the [employer] fitness program, which is designed for cancer survivors to help them become more physically fit after cancer treatments.  She was instructed to limit her alcohol consumption and continue to abstain from tobacco use.      6. Support services/counseling: It is not uncommon for this period of the patient's cancer care trajectory to be one of many emotions and stressors.  She was given information regarding our available services and encouraged to contact me with any questions or for help enrolling in any of our support group/programs.    Follow up instructions:    -Return to cancer center in 6 months for f/u with Dr. Al Pimple -Mammogram due in 03/2022 -She is welcome to return back to the Survivorship Clinic at any time; no additional follow-up needed at this time.  -Consider referral back to survivorship as a long-term survivor for continued surveillance  The patient was provided an opportunity to ask questions and all were answered. The patient agreed with the plan and demonstrated an understanding of the instructions.   Total encounter time:40 minutes*in face-to-face visit time, chart review, lab review, care coordination, order entry, and documentation of the encounter time.    Lillard Anes, NP 12/04/21 12:52 PM Medical Oncology and Hematology [facility] [address] [place], [place] [address] Tel. [phone]    Fax. [phone]  *Total Encounter Time as defined by the Centers for Medicare and Medicaid Services includes, in addition to the face-to-face time of a patient visit (documented in the note above) non-face-to-face time: obtaining and reviewing outside history, ordering and reviewing medications, tests or procedures, care coordination (communications with other health care professionals or caregivers) and documentation in the medical record.

## 2021-12-04 ENCOUNTER — Inpatient Hospital Stay: Payer: Medicare Other | Attending: Hematology and Oncology | Admitting: Adult Health

## 2021-12-04 ENCOUNTER — Other Ambulatory Visit: Payer: Self-pay

## 2021-12-04 ENCOUNTER — Encounter: Payer: Self-pay | Admitting: Adult Health

## 2021-12-04 VITALS — BP 142/63 | HR 72 | Temp 97.7°F | Resp 16 | Ht 62.0 in | Wt 114.9 lb

## 2021-12-04 DIAGNOSIS — Z79811 Long term (current) use of aromatase inhibitors: Secondary | ICD-10-CM | POA: Diagnosis not present

## 2021-12-04 DIAGNOSIS — C50411 Malignant neoplasm of upper-outer quadrant of right female breast: Secondary | ICD-10-CM | POA: Insufficient documentation

## 2021-12-04 DIAGNOSIS — Z170 Estrogen receptor positive status [ER+]: Secondary | ICD-10-CM | POA: Diagnosis not present

## 2021-12-04 NOTE — Addendum Note (Signed)
Addended by: [employer] D on: 12/04/2021 01:31 PM   Modules accepted: Orders

## 2021-12-05 ENCOUNTER — Telehealth: Payer: Self-pay | Admitting: Adult Health

## 2021-12-05 NOTE — Telephone Encounter (Signed)
Scheduled appointment per 10/23 los. Patient is aware.

## 2021-12-06 ENCOUNTER — Encounter: Payer: Self-pay | Admitting: Hematology and Oncology

## 2022-03-02 ENCOUNTER — Other Ambulatory Visit: Payer: Self-pay

## 2022-03-02 ENCOUNTER — Encounter (HOSPITAL_BASED_OUTPATIENT_CLINIC_OR_DEPARTMENT_OTHER): Payer: Self-pay

## 2022-03-02 ENCOUNTER — Emergency Department (HOSPITAL_BASED_OUTPATIENT_CLINIC_OR_DEPARTMENT_OTHER)
Admission: EM | Admit: 2022-03-02 | Discharge: 2022-03-02 | Disposition: A | Discharge status: Home / Self Care | Payer: Medicare Other | Attending: Emergency Medicine | Admitting: Emergency Medicine

## 2022-03-02 ENCOUNTER — Emergency Department (HOSPITAL_BASED_OUTPATIENT_CLINIC_OR_DEPARTMENT_OTHER): Payer: Medicare Other | Admitting: Radiology

## 2022-03-02 DIAGNOSIS — R29898 Other symptoms and signs involving the musculoskeletal system: Secondary | ICD-10-CM | POA: Insufficient documentation

## 2022-03-02 DIAGNOSIS — Z853 Personal history of malignant neoplasm of breast: Secondary | ICD-10-CM | POA: Diagnosis not present

## 2022-03-02 DIAGNOSIS — R450 Nervousness: Secondary | ICD-10-CM | POA: Diagnosis present

## 2022-03-02 LAB — CBC WITH DIFFERENTIAL/PLATELET
Abs Immature Granulocytes: 0.01 10*3/uL (ref 0.00–0.07)
Basophils Absolute: 0.1 10*3/uL (ref 0.0–0.1)
Basophils Relative: 1 %
Eosinophils Absolute: 0.3 10*3/uL (ref 0.0–0.5)
Eosinophils Relative: 4 %
HCT: 38.8 % (ref 36.0–46.0)
Hemoglobin: 12.7 g/dL (ref 12.0–15.0)
Immature Granulocytes: 0 %
Lymphocytes Relative: 31 %
Lymphs Abs: 2.1 10*3/uL (ref 0.7–4.0)
MCH: 32.6 pg (ref 26.0–34.0)
MCHC: 32.7 g/dL (ref 30.0–36.0)
MCV: 99.5 fL (ref 80.0–100.0)
Monocytes Absolute: 0.4 10*3/uL (ref 0.1–1.0)
Monocytes Relative: 6 %
Neutro Abs: 4 10*3/uL (ref 1.7–7.7)
Neutrophils Relative %: 58 %
Platelets: 281 10*3/uL (ref 150–400)
RBC: 3.9 MIL/uL (ref 3.87–5.11)
RDW: 12.7 % (ref 11.5–15.5)
WBC: 6.8 10*3/uL (ref 4.0–10.5)
nRBC: 0 % (ref 0.0–0.2)

## 2022-03-02 LAB — COMPREHENSIVE METABOLIC PANEL
ALT: 13 U/L (ref 0–44)
AST: 21 U/L (ref 15–41)
Albumin: 4.2 g/dL (ref 3.5–5.0)
Alkaline Phosphatase: 39 U/L (ref 38–126)
Anion gap: 7 (ref 5–15)
BUN: 19 mg/dL (ref 8–23)
CO2: 26 mmol/L (ref 22–32)
Calcium: 9.6 mg/dL (ref 8.9–10.3)
Chloride: 107 mmol/L (ref 98–111)
Creatinine, Ser: 0.93 mg/dL (ref 0.44–1.00)
GFR, Estimated: 59 mL/min — ABNORMAL LOW (ref >60)
Glucose, Bld: 78 mg/dL (ref 70–99)
Potassium: 4.2 mmol/L (ref 3.5–5.1)
Sodium: 140 mmol/L (ref 135–145)
Total Bilirubin: 0.3 mg/dL (ref 0.3–1.2)
Total Protein: 6.8 g/dL (ref 6.5–8.1)

## 2022-03-02 NOTE — ED Provider Notes (Signed)
[facility] Provider Note   CSN: [id number] Arrival date & time: 03/02/22  1144     History  Chief Complaint  Patient presents with   Hip Pain    Candice Brown is a 89 y.o. female.  Patient is an 89 year old female with a past medical history of breast cancer on anastrozole, scoliosis, peripheral neuropathy presenting to the emergency department with weakness and jitteriness.  The patient states that for the last several years her bilateral knees will intermittently give out on her however she states over the last 2 days they have been giving out on her anytime she tries to stand up and walk.  She denies any trauma or falls.  She also reports that her hands have been feeling more shaky when she tries to do fine motor skills.  She denies any numbness or weakness.  She states that she has had some pain in her left hip that she attributed to arthritis.  She denies any back pain, saddle anesthesia, loss of bowel or bladder function.  The history is provided by the patient and a relative.  Hip Pain       Home Medications Prior to Admission medications   Medication Sig Start Date End Date Taking? Authorizing Provider  anastrozole (ARIMIDEX) 1 MG tablet Take 1 tablet (1 mg total) by mouth daily. 08/10/21   Benay Pike, MD  diphenhydramine-acetaminophen (TYLENOL PM) 25-500 MG TABS tablet Take 2 tablets by mouth at bedtime as needed (Sleep).    [provider]  loperamide (IMODIUM) 2 MG capsule Take 1 mg by mouth as needed for diarrhea or loose stools. Taking every 5 days    [provider]  Methylcellulose, Laxative, (CITRUCEL PO) Take 2 tablets by mouth daily.    [provider]  Multiple Vitamin (MULTI VITAMIN) TABS Take 1 tablet by mouth daily. Senior woman    [provider]  nicotine polacrilex (NICORETTE) 4 MG gum Take 4 mg by mouth as needed for smoking cessation.    [provider]  NON  FORMULARY Take 2 tablets by mouth at bedtime. Hyland leg cramp pills    [provider]  OVER THE COUNTER MEDICATION Take 2 tablets by mouth daily as needed (Back pain). Back and body aspirin    [provider]  pantoprazole (PROTONIX) 40 MG tablet Take 40 mg by mouth daily.     [provider]  polycarbophil (FIBERCON) 625 MG tablet Take 1,250 mg by mouth at bedtime.    [provider]  Polyvinyl Alcohol-Povidone (REFRESH OP) Place 1 drop into both eyes daily as needed (Dry eye).    [provider]  pregabalin (LYRICA) 150 MG capsule Take 150 mg by mouth daily.    [provider]  Probiotic Product (ALIGN PO) Take 1 tablet by mouth daily.    [provider]  rosuvastatin (CRESTOR) 10 MG tablet Take 10 mg by mouth 2 (two) times a week. 06/28/15   [provider]  sertraline (ZOLOFT) 50 MG tablet Take 25 mg by mouth daily. 08/19/19   [provider]      Allergies    Linzess [linaclotide], Oxycodone, Simvastatin, Benzalkonium chloride, Neosporin [neomycin-polymyxin-gramicidin], and Tape    Review of Systems   Review of Systems  Physical Exam Updated Vital Signs BP (!) 142/69   Pulse (!) 56   Temp 98.2 F (36.8 C)   Resp 15   Ht '5\' 2"'$  (1.575 m)   Wt 52.1  kg   SpO2 96%   BMI 21.01 kg/m  Physical Exam Vitals and nursing note reviewed.  Constitutional:      General: She is not in acute distress.    Appearance: Normal appearance.  HENT:     Head: Normocephalic and atraumatic.     Nose: Nose normal.     Mouth/Throat:     Mouth: Mucous membranes are moist.     Pharynx: Oropharynx is clear.  Eyes:     Extraocular Movements: Extraocular movements intact.     Conjunctiva/sclera: Conjunctivae normal.  Neck:     Comments: No midline neck tenderness Cardiovascular:     Rate and Rhythm: Normal rate and regular rhythm.     Pulses: Normal pulses.     Heart sounds: Normal heart sounds.  Pulmonary:      Effort: Pulmonary effort is normal.     Breath sounds: Normal breath sounds.  Abdominal:     General: Abdomen is flat.     Palpations: Abdomen is soft.     Tenderness: There is no abdominal tenderness.  Musculoskeletal:        General: Normal range of motion.     Cervical back: Normal range of motion and neck supple.     Right lower leg: No edema.     Left lower leg: No edema.     Comments: No midline back tenderness No bony tenderness to bilateral lower extremities, full hip internal/external rotation without pain On left lower extremity, strength 4 out of 5 in hip flexion and knee extension, strength 5 out of 5 in plantar and dorsi flexion 5 out of 5 strength in all muscle groups in right lower extremity  Skin:    General: Skin is warm and dry.     Findings: No rash.  Neurological:     General: No focal deficit present.     Mental Status: She is alert and oriented to person, place, and time.     Cranial Nerves: No cranial nerve deficit.     Sensory: No sensory deficit.     Motor: Weakness (Left lower extremity per MSK exam above) present.     Coordination: Coordination normal.  Psychiatric:        Mood and Affect: Mood normal.        Behavior: Behavior normal.     ED Results / Procedures / Treatments   Labs (all labs ordered are listed, but only abnormal results are displayed) Labs Reviewed  COMPREHENSIVE METABOLIC PANEL - Abnormal; Notable for the following components:      Result Value   GFR, Estimated 59 (*)    All other components within normal limits  CBC WITH DIFFERENTIAL/PLATELET    EKG None  Radiology DG Hip Unilat With Pelvis 2-3 Views Left  Result Date: 03/02/2022 CLINICAL DATA:  Pain EXAM: DG HIP (WITH OR WITHOUT PELVIS) 3V LEFT COMPARISON:  None Available. FINDINGS: There is no evidence of hip fracture or dislocation. There is no evidence of arthropathy or other focal bone abnormality. IMPRESSION: Negative. Electronically Signed   By: Sammie Bench  M.D.   On: 03/02/2022 14:36    Procedures Procedures    Medications Ordered in ED Medications - No data to display  ED Course/ Medical Decision Making/ A&P                             Medical Decision Making This patient presents to the ED with chief complaint(s) of  weakness/jitteriness with pertinent past medical history of neuropathy, scoliosis, breast cancer on anastrozole which further complicates the presenting complaint. The complaint involves an extensive differential diagnosis and also carries with it a high risk of complications and morbidity.    The differential diagnosis includes secondary arthritis, muscle weakness, electrolyte abnormality, patient has no back pain or back tenderness making spinal cord injury unlikely  Additional history obtained: Additional history obtained from family Records reviewed Primary Care Documents  ED Course and Reassessment: Upon patient's arrival to the emergency department she is awake and alert well-appearing in no acute distress.  She does have some weakness in hip flexion and knee extension in the left leg though she does not report significant pain.  She has good strength in ankle plantar and dorsi flexion and sensation is intact.  X-ray performed to evaluate for bony abnormality.  She will labs performed to evaluate for electrolyte abnormality as cause of her generalized weakness and shakiness.  Independent labs interpretation:  The following labs were independently interpreted: Within normal range  Independent visualization of imaging: - I independently visualized the following imaging with scope of interpretation limited to determining acute life threatening conditions related to emergency care: Hip x-ray, which revealed no acute disease  Consultation: - Consulted or discussed management/test interpretation w/ external professional: N/A  Consideration for admission or further workup: Patient has no emergent conditions requiring  admission or further workup at this time and she feels comfortable with discharge home and close orthopedic follow-up.   Social Determinants of health: N/A    Amount and/or Complexity of Data Reviewed Labs: ordered. Radiology: ordered.          Final Clinical Impression(s) / ED Diagnoses Final diagnoses:  Weakness of left lower extremity  Jittery feeling    Rx / DC Orders ED Discharge Orders     None         Kemper Durie, DO 03/02/22 1504

## 2022-03-02 NOTE — ED Triage Notes (Signed)
Patient here POV from [place] with Family.  Endorses Buckling to Bilateral Knees that began last PM that has continued through to this AM. Also endorses "Jumpiness" to Fingers this AM.   Also endorses Spasms to Left Hip for several Months. Diagnosed with Arthritis. No Dizziness. No Fall. No Trauma.   NAD Noted during Triage. A&Ox4. GCS 15. BIB Wheelchair.

## 2022-03-02 NOTE — ED Notes (Signed)
DC papers reviewed. No questions or concerns. No signs of distress. Pt assisted to wheelchair and out to lobby. Appropriate measures for safety taken. 

## 2022-03-02 NOTE — Discharge Instructions (Addendum)
You were seen in the emergency department for your weakness and shakiness.  Your workup showed no signs of dehydration or abnormal electrolytes.  You did have some weakness in your leg but you had no signs of severe arthritis in your hip.  You have had a history of bulging disks and this may be contributing to some of the weakness in your leg.  You can follow-up with your primary doctor and your orthopedic spine doctor to have your symptoms rechecked and to determine if you need further workup or imaging.  You can use a cane or walker to help with ambulation to prevent falls.  You should return to the emergency department if you are having worsening weakness and you are unable to walk, you have severe back pain, you are unable to urinate or if you have any other new or concerning symptoms.

## 2022-03-08 ENCOUNTER — Other Ambulatory Visit: Payer: Self-pay

## 2022-03-08 ENCOUNTER — Ambulatory Visit (INDEPENDENT_AMBULATORY_CARE_PROVIDER_SITE_OTHER): Payer: Medicare Other | Admitting: Physician Assistant

## 2022-03-08 ENCOUNTER — Encounter: Payer: Self-pay | Admitting: Physician Assistant

## 2022-03-08 DIAGNOSIS — M545 Low back pain, unspecified: Secondary | ICD-10-CM

## 2022-03-08 NOTE — Progress Notes (Signed)
Office Visit Note   Patient: Candice Brown           Date of Birth: 1931-09-01           MRN: [id number] Visit Date: 03/08/2022              Requested by: Burnard Bunting, MD [address] [place],  [place] [address] PCP: Burnard Bunting, MD  Chief Complaint  Patient presents with   Right Knee - Pain   Left Knee - Pain      HPI: Candice Brown is a pleasant 89 year old woman who tries to stay active.  She is a patient of Dr. Durward Fortes.  She has a significant history for degenerative scoliosis and multiple degenerative and stenotic issues with her spine.  She comes in today complaining of her knees buckling and giving way.  She denies any pain.  She denies any paresthesias.  She denies any back pain today.  She is using a walker for balance.  She does not have pain when the knees give way.  She also notices that when she bends over to put her shoes on sometimes her legs shake.  She tries to take active by taking ballroom dancing lessons.  She has had physical therapy for something similar in the past and was told that it seemed to be more prominent when she was nervous.  Denies any loss of bowel or bladder control  Assessment & Plan: Visit Diagnoses: Lower extremity weakness  Plan: Her knee exam is completely benign.  I do not think this is coming from her knees as she does not have any pain and effusion and I do have more suspicion giving her previous x-rays and lumbar spine MRI that was done last year that this could be coming from her back.  I will refer her to Dr. Laurance Flatten to rule out a radicular cause for her pain.  I will also refer to physical therapy.  I did tell her that if Dr. Laurance Flatten did not think that there was a spine issue for the symptoms She is having he may recommend referral to a neurologist.  Follow-Up Instructions: Return Dr. Laurance Flatten.   Ortho Exam  Patient is alert, oriented, no adenopathy, well-dressed, normal affect, normal respiratory effort. Patient is pleasant alert  ambulating with a walker.  Exam is benign today she has no back pain she has actually fairly good strength with resisted dorsiflexion and plantarflexion of her ankles and knees.  Negative straight leg raise bilaterally.  She has good strength with flexion of her hip.  No tenderness over the knees no effusion no warmth  Imaging: No results found. No images are attached to the encounter.  Labs: Lab Results  Component Value Date   HGBA1C 5.5 12/31/2018   ESRSEDRATE 2 12/31/2018   CRP 2 12/31/2018     Lab Results  Component Value Date   ALBUMIN 4.2 03/02/2022    No results found for: "MG" No results found for: "VD25OH"  No results found for: "PREALBUMIN"    Latest Ref Rng & Units 03/02/2022    1:27 PM 11/18/2018   10:35 AM  CBC EXTENDED  WBC 4.0 - 10.5 K/uL 6.8  6.4   RBC 3.87 - 5.11 MIL/uL 3.90  3.91   Hemoglobin 12.0 - 15.0 g/dL 12.7  12.8   HCT 36.0 - 46.0 % 38.8  38.2   Platelets 150 - 400 K/uL 281  333   NEUT# 1.7 - 7.7 K/uL 4.0  3,770   Lymph#  0.7 - 4.0 K/uL 2.1  1,837      There is no height or weight on file to calculate BMI.  Orders:  No orders of the defined types were placed in this encounter.  No orders of the defined types were placed in this encounter.    Procedures: No procedures performed  Clinical Data: No additional findings.  ROS:  All other systems negative, except as noted in the HPI. Review of Systems  Objective: Vital Signs: There were no vitals taken for this visit.  Specialty Comments:  No specialty comments available.  PMFS History: Patient Active Problem List   Diagnosis Date Noted   Malignant neoplasm of upper-outer quadrant of right breast in female, estrogen receptor positive ([facility]) 06/29/2021   Low back pain 05/24/2021   Chronic diarrhea 05/18/2020   Change in bowel habits 05/18/2020   Knee pain, bilateral 11/18/2018   Past Medical History:  Diagnosis Date   Anemia    Arthritis    Colon polyps    hyperplastic    Complication of anesthesia    hard to wake up   Depression    Diverticulosis    GERD (gastroesophageal reflux disease)    Hiatal hernia    Hyperlipidemia    IBS (irritable bowel syndrome)    Internal hemorrhoids    Lung nodule    Occipital neuralgia    Peripheral neuropathy     Family History  Problem Relation Age of Onset   Breast cancer Mother 90   Heart disease Father    CVA Father    Heart attack Father 41   Heart attack Paternal Grandfather    Colon cancer Neg Hx    Stomach cancer Neg Hx    Rectal cancer Neg Hx    Esophageal cancer Neg Hx     Past Surgical History:  Procedure Laterality Date   ABDOMINAL HYSTERECTOMY  1972   APPENDECTOMY  1959   BREAST LUMPECTOMY WITH RADIOACTIVE SEED LOCALIZATION Right 07/18/2021   Procedure: RIGHT BREAST LUMPECTOMY WITH RADIOACTIVE SEED LOCALIZATION;  Surgeon: Stark Klein, MD;  Location: [facility];  Service: General;  Laterality: Right;   CATARACT EXTRACTION Bilateral    left 03/2014, right eye 06/2016   CHOLECYSTECTOMY     Social History   Occupational History   Occupation: retired  Tobacco Use   Smoking status: Every Day    Packs/day: 0.10    Types: Cigarettes    Last attempt to quit: 10/13/2016    Years since quitting: 5.4   Smokeless tobacco: Never   Tobacco comments:    5 cigarettes per week.    12/31/18  nicotene gum, lozenges  Vaping Use   Vaping Use: Never used  Substance and Sexual Activity   Alcohol use: Yes    Alcohol/week: 0.0 standard drinks of alcohol    Comment: less than 1/2 glass wine daily   Drug use: No   Sexual activity: Not on file

## 2022-04-05 ENCOUNTER — Ambulatory Visit: Payer: Medicare Other | Admitting: Orthopedic Surgery

## 2022-04-05 ENCOUNTER — Ambulatory Visit
Admission: RE | Admit: 2022-04-05 | Discharge: 2022-04-05 | Disposition: A | Discharge status: Home / Self Care | Payer: Medicare Other | Source: Ambulatory Visit | Attending: Adult Health | Admitting: Adult Health

## 2022-04-05 DIAGNOSIS — Z170 Estrogen receptor positive status [ER+]: Secondary | ICD-10-CM

## 2022-04-12 ENCOUNTER — Ambulatory Visit: Payer: Medicare Other | Admitting: Orthopedic Surgery

## 2022-06-05 ENCOUNTER — Other Ambulatory Visit: Payer: Self-pay

## 2022-06-05 ENCOUNTER — Inpatient Hospital Stay: Payer: Medicare Other | Attending: Hematology and Oncology | Admitting: Hematology and Oncology

## 2022-06-05 VITALS — BP 148/72 | HR 76 | Temp 97.8°F | Resp 18 | Ht 62.0 in | Wt 114.6 lb

## 2022-06-05 DIAGNOSIS — C50411 Malignant neoplasm of upper-outer quadrant of right female breast: Secondary | ICD-10-CM | POA: Insufficient documentation

## 2022-06-05 DIAGNOSIS — Z170 Estrogen receptor positive status [ER+]: Secondary | ICD-10-CM | POA: Diagnosis not present

## 2022-06-05 DIAGNOSIS — Z79811 Long term (current) use of aromatase inhibitors: Secondary | ICD-10-CM | POA: Diagnosis not present

## 2022-06-05 DIAGNOSIS — M8580 Other specified disorders of bone density and structure, unspecified site: Secondary | ICD-10-CM | POA: Insufficient documentation

## 2022-06-05 NOTE — Progress Notes (Signed)
BRIEF ONCOLOGIC HISTORY:  Oncology History  Malignant neoplasm of upper-outer quadrant of right breast in female, estrogen receptor positive  04/03/2021 Mammogram   Mammogram done on April 03, 2021 showed possible distortion in the right breast, possible asymmetry in the left breast.  Diagnostic mammogram showed suspicious small mass with associated architectural distortion in the right breast at 10:00, 2 cm from the nipple.  Ultrasound-guided core needle biopsy was recommended    06/15/2021 Pathology Results   Initial biopsy:IDC, g1, ER/PR+, KI67 1%, HER-2 -.     06/29/2021 Initial Diagnosis   Malignant neoplasm of upper-outer quadrant of right breast in female, estrogen receptor positive ([facility])   07/18/2021 Surgery   A. BREAST, RIGHT, LUMPECTOMY:  - Invasive ductal carcinoma, 1.2 cm, grade 1  - Resection margins are negative for carcinoma - closest are the  posterior and superior margins at 0.6 cm  - Biopsy site changes  - See oncology table    07/24/2021 Cancer Staging   Staging form: Breast, AJCC 8th Edition - Pathologic: Stage IA (pT1c, pN0, cM0, G1, ER+, PR+, HER2-) - Signed by Ronny Bacon, PA-C on 07/24/2021 Method of lymph node assessment: Clinical Histologic grading system: 3 grade system   08/10/2021 -  Anti-estrogen oral therapy   Anastrozole      ASSESSMENT AND PLAN:  Malignant neoplasm of upper-outer quadrant of right breast in female, estrogen receptor positive ([facility]CC) This is a very pleasant 89 year old postmenopausal female patient with newly diagnosed right breast invasive ductal carcinoma, tubular type, grade 1, ER 95% strong, PR 40% strong, Ki-67 of 1%, HER2 negative scheduled for right breast lumpectomy on June 6 referred to medical oncology for adjuvant recommendations.   She is status postlumpectomy and is here for follow-up.  Final pathology showed a grade one 1.2 cm IDC, negative margins.  Prior prognostic showed ER +95% strong staining PR 40% positive  strong staining.  She is now on adjuvant anastrozole.  She is tolerating anastrozole extremely well.  She denies any new health complaints.  Her last bone density showed osteopenia.  She is regularly walking, takes some multivitamins with vitamin D in it.  No breast changes reported.  Most recent mammogram with no concerns.  Physical exam also unremarkable.  I have encouraged her to continue anastrozole and return to clinic in 6 months or sooner as needed.    INTERVAL HISTORY:   Patient is here for follow-up with her daughter.  She has been tolerating anastrozole extremely well.  She denies any new health complaints. She is thankful she finally has some hearing aids so she can hear the conversation around the dinner table.  She had her last mammogram in February which was unremarkable.  Rest of the pertinent 10 point ROS reviewed and negative  REVIEW OF SYSTEMS:  Review of Systems  Constitutional:  Positive for appetite change. Negative for chills, fatigue, fever and unexpected weight change.  HENT:   Negative for hearing loss, lump/mass and trouble swallowing.   Eyes:  Negative for eye problems and icterus.  Respiratory:  Negative for chest tightness, cough and shortness of breath.   Cardiovascular:  Negative for chest pain, leg swelling and palpitations.  Gastrointestinal:  Negative for abdominal distention, abdominal pain, constipation, diarrhea, nausea and vomiting.  Endocrine: Negative for hot flashes.  Genitourinary:  Negative for difficulty urinating.   Musculoskeletal:  Negative for arthralgias.  Skin:  Negative for itching and rash.  Neurological:  Negative for dizziness, extremity weakness, headaches and numbness.  Hematological:  Negative  for adenopathy. Does not bruise/bleed easily.  Psychiatric/Behavioral:  Negative for depression. The patient is not nervous/anxious.      ONCOLOGY TREATMENT TEAM:  1. Surgeon:  Dr. Donell Beers at [facility] 2. Medical Oncologist: Dr.  Al Pimple  3. Radiation Oncologist: Dr. Mitzi Hansen    PAST MEDICAL/SURGICAL HISTORY:  Past Medical History:  Diagnosis Date   Anemia    Arthritis    Colon polyps    hyperplastic   Complication of anesthesia    hard to wake up   Depression    Diverticulosis    GERD (gastroesophageal reflux disease)    Hiatal hernia    Hyperlipidemia    IBS (irritable bowel syndrome)    Internal hemorrhoids    Lung nodule    Occipital neuralgia    Peripheral neuropathy    Past Surgical History:  Procedure Laterality Date   ABDOMINAL HYSTERECTOMY  1972   APPENDECTOMY  1959   BREAST LUMPECTOMY WITH RADIOACTIVE SEED LOCALIZATION Right 07/18/2021   Procedure: RIGHT BREAST LUMPECTOMY WITH RADIOACTIVE SEED LOCALIZATION;  Surgeon: Almond Lint, MD;  Location: [facility];  Service: General;  Laterality: Right;   CATARACT EXTRACTION Bilateral    left 03/2014, right eye 06/2016   CHOLECYSTECTOMY       ALLERGIES:  Allergies  Allergen Reactions   Linzess [Linaclotide] Diarrhea    Increased leg cramps   Oxycodone Nausea Only     ( made very sick)   Simvastatin Other (See Comments)    Leg cramps   Benzalkonium Chloride Rash    In neosporin   Neosporin [Neomycin-Polymyxin-Gramicidin] Rash   Tape Rash    Adhesive patches of any kind     CURRENT MEDICATIONS:  Outpatient Encounter Medications as of 06/05/2022  Medication Sig   anastrozole (ARIMIDEX) 1 MG tablet Take 1 tablet (1 mg total) by mouth daily.   diphenhydramine-acetaminophen (TYLENOL PM) 25-500 MG TABS tablet Take 2 tablets by mouth at bedtime as needed (Sleep).   loperamide (IMODIUM) 2 MG capsule Take 1 mg by mouth as needed for diarrhea or loose stools. Taking every 5 days   Methylcellulose, Laxative, (CITRUCEL PO) Take 2 tablets by mouth daily.   Multiple Vitamin (MULTI VITAMIN) TABS Take 1 tablet by mouth daily. Senior woman   nicotine polacrilex (NICORETTE) 4 MG gum Take 4 mg by mouth as needed for smoking cessation.   NON  FORMULARY Take 2 tablets by mouth at bedtime. Hyland leg cramp pills   OVER THE COUNTER MEDICATION Take 2 tablets by mouth daily as needed (Back pain). Back and body aspirin   pantoprazole (PROTONIX) 40 MG tablet Take 40 mg by mouth daily.    polycarbophil (FIBERCON) 625 MG tablet Take 1,250 mg by mouth at bedtime.   Polyvinyl Alcohol-Povidone (REFRESH OP) Place 1 drop into both eyes daily as needed (Dry eye).   pregabalin (LYRICA) 150 MG capsule Take 150 mg by mouth daily.   Probiotic Product (ALIGN PO) Take 1 tablet by mouth daily.   rosuvastatin (CRESTOR) 10 MG tablet Take 10 mg by mouth 2 (two) times a week.   sertraline (ZOLOFT) 50 MG tablet Take 25 mg by mouth daily.   No facility-administered encounter medications on file as of 06/05/2022.     ONCOLOGIC FAMILY HISTORY:  Family History  Problem Relation Age of Onset   Breast cancer Mother 29   Heart disease Father    CVA Father    Heart attack Father 54   Heart attack Paternal Grandfather  Colon cancer Neg Hx    Stomach cancer Neg Hx    Rectal cancer Neg Hx    Esophageal cancer Neg Hx      SOCIAL HISTORY:  Social History   Socioeconomic History   Marital status: Divorced    Spouse name: Not on file   Number of children: 3   Years of education: Masters   Highest education level: Not on file  Occupational History   Occupation: retired  Tobacco Use   Smoking status: Every Day    Packs/day: .1    Types: Cigarettes    Last attempt to quit: 10/13/2016    Years since quitting: 5.6   Smokeless tobacco: Never   Tobacco comments:    5 cigarettes per week.    12/31/18  nicotene gum, lozenges  Vaping Use   Vaping Use: Never used  Substance and Sexual Activity   Alcohol use: Yes    Alcohol/week: 0.0 standard drinks of alcohol    Comment: less than 1/2 glass wine daily   Drug use: No   Sexual activity: Not on file  Other Topics Concern   Not on file  Social History Narrative   12/31/18 Lives alone   Caffeine-  coffee, 1- 1 1/2 cups daily   Social Determinants of Health   Financial Resource Strain: Not on file  Food Insecurity: Not on file  Transportation Needs: Unknown (07/21/2021)   PRAPARE - Administrator, Civil Service (Medical): No    Lack of Transportation (Non-Medical): Not on file  Physical Activity: Not on file  Stress: No Stress Concern Present (07/21/2021)   [employer] of Occupational Health - Occupational Stress Questionnaire    Feeling of Stress : Not at all  Social Connections: Not on file  Intimate Partner Violence: Not on file     OBSERVATIONS/OBJECTIVE:  BP (!) 148/72 (BP Location: Left Arm, Patient Position: Sitting)   Pulse 76   Temp 97.8 F (36.6 C) (Temporal)   Resp 18   Ht 5\' 2"  (1.575 m)   Wt 114 lb 9.6 oz (52 kg)   SpO2 100%   BMI 20.96 kg/m  General appearance: Alert, oriented and in no acute distress Chest: Bilateral breasts inspected.  No palpable masses.  No regional adenopathy.   LABORATORY DATA:  None for this visit.  DIAGNOSTIC IMAGING:  None for this visit.   Total time spent: 20 min  *Total Encounter Time as defined by the Centers for Medicare and Medicaid Services includes, in addition to the face-to-face time of a patient visit (documented in the note above) non-face-to-face time: obtaining and reviewing outside history, ordering and reviewing medications, tests or procedures, care coordination (communications with other health care professionals or caregivers) and documentation in the medical record.

## 2022-06-05 NOTE — Assessment & Plan Note (Signed)
This is a very pleasant 89 year old postmenopausal female patient with newly diagnosed right breast invasive ductal carcinoma, tubular type, grade 1, ER 95% strong, PR 40% strong, Ki-67 of 1%, HER2 negative scheduled for right breast lumpectomy on June 6 referred to medical oncology for adjuvant recommendations.   She is status postlumpectomy and is here for follow-up.  Final pathology showed a grade one 1.2 cm IDC, negative margins.  Prior prognostic showed ER +95% strong staining PR 40% positive strong staining.  She is now on adjuvant anastrozole.  She is tolerating anastrozole extremely well.  She denies any new health complaints.  Her last bone density showed osteopenia.  She is regularly walking, takes some multivitamins with vitamin D in it.  No breast changes reported.  Most recent mammogram with no concerns.  Physical exam also unremarkable.  I have encouraged her to continue anastrozole and return to clinic in 6 months or sooner as needed.

## 2022-06-06 ENCOUNTER — Telehealth: Payer: Self-pay | Admitting: Hematology and Oncology

## 2022-06-06 NOTE — Telephone Encounter (Signed)
Spoke with the patient confirming upcoming appointment

## 2022-08-17 ENCOUNTER — Other Ambulatory Visit: Payer: Self-pay | Admitting: *Deleted

## 2022-08-17 MED ORDER — ANASTROZOLE 1 MG PO TABS: 1.0000 mg | ORAL_TABLET | Freq: Every day | ORAL | 3 refills | Status: DC

## 2022-08-20 ENCOUNTER — Telehealth: Payer: Self-pay | Admitting: Internal Medicine

## 2022-08-20 NOTE — Telephone Encounter (Signed)
Inbound call from patient inuring about "instructions that were left for medications".  States it for modium and citrucel. Please advise.? Please advise.

## 2022-08-22 NOTE — Telephone Encounter (Signed)
Lm on vm 

## 2022-12-05 ENCOUNTER — Ambulatory Visit: Payer: Medicare Other | Admitting: Physician Assistant

## 2022-12-05 ENCOUNTER — Encounter: Payer: Self-pay | Admitting: Physician Assistant

## 2022-12-05 DIAGNOSIS — S20221A Contusion of right back wall of thorax, initial encounter: Secondary | ICD-10-CM | POA: Diagnosis not present

## 2022-12-05 DIAGNOSIS — S20229A Contusion of unspecified back wall of thorax, initial encounter: Secondary | ICD-10-CM | POA: Insufficient documentation
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
## 2022-12-06 ENCOUNTER — Ambulatory Visit: Payer: Medicare Other | Admitting: Hematology and Oncology

## 2022-12-27 ENCOUNTER — Inpatient Hospital Stay: Payer: Medicare Other | Attending: Hematology and Oncology | Admitting: Hematology and Oncology

## 2022-12-27 ENCOUNTER — Other Ambulatory Visit: Payer: Self-pay

## 2022-12-27 VITALS — BP 128/53 | HR 61 | Temp 97.5°F | Resp 16 | Wt 118.7 lb

## 2022-12-27 DIAGNOSIS — Z79811 Long term (current) use of aromatase inhibitors: Secondary | ICD-10-CM | POA: Diagnosis not present

## 2022-12-27 DIAGNOSIS — Z170 Estrogen receptor positive status [ER+]: Secondary | ICD-10-CM | POA: Insufficient documentation

## 2022-12-27 DIAGNOSIS — F17210 Nicotine dependence, cigarettes, uncomplicated: Secondary | ICD-10-CM | POA: Diagnosis not present

## 2022-12-27 DIAGNOSIS — C50411 Malignant neoplasm of upper-outer quadrant of right female breast: Secondary | ICD-10-CM

## 2022-12-27 NOTE — Progress Notes (Signed)
BRIEF ONCOLOGIC HISTORY:  Oncology History  Malignant neoplasm of upper-outer quadrant of right breast in female, estrogen receptor positive (HCC)  04/03/2021 Mammogram   Mammogram done on April 03, 2021 showed possible distortion in the right breast, possible asymmetry in the left breast.  Diagnostic mammogram showed suspicious small mass with associated architectural distortion in the right breast at 10:00, 2 cm from the nipple.  Ultrasound-guided core needle biopsy was recommended    06/15/2021 Pathology Results   Initial biopsy:IDC, g1, ER/PR+, KI67 1%, HER-2 -.     06/29/2021 Initial Diagnosis   Malignant neoplasm of upper-outer quadrant of right breast in female, estrogen receptor positive (HCC)   07/18/2021 Surgery   A. BREAST, RIGHT, LUMPECTOMY:  - Invasive ductal carcinoma, 1.2 cm, grade 1  - Resection margins are negative for carcinoma - closest are the  posterior and superior margins at 0.6 cm  - Biopsy site changes  - See oncology table    07/24/2021 Cancer Staging   Staging form: Breast, AJCC 8th Edition - Pathologic: Stage IA (pT1c, pN0, cM0, G1, ER+, PR+, HER2-) - Signed by Ronny Bacon, PA-C on 07/24/2021 Method of lymph node assessment: Clinical Histologic grading system: 3 grade system   08/10/2021 -  Anti-estrogen oral therapy   Anastrozole     ASSESSMENT AND PLAN:   Malignant neoplasm of upper-outer quadrant of right breast in female, estrogen receptor positive (HCC) This is a very pleasant 89 year old postmenopausal female patient with newly diagnosed right breast invasive ductal carcinoma, tubular type, grade 1, ER 95% strong, PR 40% strong, Ki-67 of 1%, HER2 negative scheduled for right breast lumpectomy on June 6 referred to medical oncology for adjuvant recommendations.   She is status postlumpectomy and is here for follow-up.  Final pathology showed a grade one 1.2 cm IDC, negative margins.  Prior prognostic showed ER +95% strong staining PR 40%  positive strong staining.  She is now on adjuvant anastrozole.  She is tolerating anastrozole extremely well.  She denies any new health complaints.    Breast Cancer Patient is on anastrozole and reports no issues with it. She has noticed a size difference in her breasts post-surgery, but it does not bother her enough to seek intervention. No abnormalities noted on physical exam. -Continue current breast cancer medication. -Order mammogram for February 2025.  General Health Maintenance Patient is active, engaging in regular walking and ballroom dancing. She has lost weight recently due to increased physical activity. -Encourage continued physical activity. -Last bone density in July 2023, t score of -1.0 -Schedule follow-up appointment in six months.   INTERVAL HISTORY:   The patient, with a history of breast cancer, presents for a routine follow-up. She reports no issues with her breast cancer medication and has not experienced any side effects. She expresses some dissatisfaction with the asymmetry of her breasts post-surgery, but it does not bother her enough to seek corrective measures. She also mentions a significant amount of walking in recent days, which has resulted in weight loss. She continues to engage in ballroom dancing as her primary form of exercise. The patient also mentions a history of long-term estrogen use, which she discontinued after participating in a study. Rest of the pertinent 10 point ROS reviewed and neg.  REVIEW OF SYSTEMS:  Review of Systems  Constitutional:  Positive for appetite change. Negative for chills, fatigue, fever and unexpected weight change.  HENT:   Negative for hearing loss, lump/mass and trouble swallowing.   Eyes:  Negative for eye  problems and icterus.  Respiratory:  Negative for chest tightness, cough and shortness of breath.   Cardiovascular:  Negative for chest pain, leg swelling and palpitations.  Gastrointestinal:  Negative for abdominal  distention, abdominal pain, constipation, diarrhea, nausea and vomiting.  Endocrine: Negative for hot flashes.  Genitourinary:  Negative for difficulty urinating.   Musculoskeletal:  Negative for arthralgias.  Skin:  Negative for itching and rash.  Neurological:  Negative for dizziness, extremity weakness, headaches and numbness.  Hematological:  Negative for adenopathy. Does not bruise/bleed easily.  Psychiatric/Behavioral:  Negative for depression. The patient is not nervous/anxious.      ONCOLOGY TREATMENT TEAM:  1. Surgeon:  Dr. Donell Beers at [facility] 2. Medical Oncologist: Dr. Al Pimple  3. Radiation Oncologist: Dr. Mitzi Hansen    PAST MEDICAL/SURGICAL HISTORY:  Past Medical History:  Diagnosis Date   Anemia    Arthritis    Colon polyps    hyperplastic   Complication of anesthesia    hard to wake up   Depression    Diverticulosis    GERD (gastroesophageal reflux disease)    Hiatal hernia    Hyperlipidemia    IBS (irritable bowel syndrome)    Internal hemorrhoids    Lung nodule    Occipital neuralgia    Peripheral neuropathy    Past Surgical History:  Procedure Laterality Date   ABDOMINAL HYSTERECTOMY  1972   APPENDECTOMY  1959   BREAST LUMPECTOMY WITH RADIOACTIVE SEED LOCALIZATION Right 07/18/2021   Procedure: RIGHT BREAST LUMPECTOMY WITH RADIOACTIVE SEED LOCALIZATION;  Surgeon: Almond Lint, MD;  Location: [facility];  Service: General;  Laterality: Right;   CATARACT EXTRACTION Bilateral    left 03/2014, right eye 06/2016   CHOLECYSTECTOMY       ALLERGIES:  Allergies  Allergen Reactions   Linzess [Linaclotide] Diarrhea    Increased leg cramps   Oxycodone Nausea Only     ( made very sick)   Simvastatin Other (See Comments)    Leg cramps   Benzalkonium Chloride Rash    In neosporin   Neosporin [Neomycin-Polymyxin-Gramicidin] Rash   Tape Rash    Adhesive patches of any kind     CURRENT MEDICATIONS:  Outpatient Encounter Medications  as of 12/27/2022  Medication Sig   anastrozole (ARIMIDEX) 1 MG tablet Take 1 tablet (1 mg total) by mouth daily.   diphenhydramine-acetaminophen (TYLENOL PM) 25-500 MG TABS tablet Take 2 tablets by mouth at bedtime as needed (Sleep).   loperamide (IMODIUM) 2 MG capsule Take 1 mg by mouth as needed for diarrhea or loose stools. Taking every 5 days   Methylcellulose, Laxative, (CITRUCEL PO) Take 2 tablets by mouth daily.   Multiple Vitamin (MULTI VITAMIN) TABS Take 1 tablet by mouth daily. Senior woman   nicotine polacrilex (NICORETTE) 4 MG gum Take 4 mg by mouth as needed for smoking cessation.   NON FORMULARY Take 2 tablets by mouth at bedtime. [place] leg cramp pills   OVER THE COUNTER MEDICATION Take 2 tablets by mouth daily as needed (Back pain). Back and body aspirin   pantoprazole (PROTONIX) 40 MG tablet Take 40 mg by mouth daily.    polycarbophil (FIBERCON) 625 MG tablet Take 1,250 mg by mouth at bedtime.   Polyvinyl Alcohol-Povidone (REFRESH OP) Place 1 drop into both eyes daily as needed (Dry eye).   pregabalin (LYRICA) 150 MG capsule Take 150 mg by mouth daily.   Probiotic Product (ALIGN PO) Take 1 tablet by mouth daily.   rosuvastatin (CRESTOR) 10  MG tablet Take 10 mg by mouth 2 (two) times a week.   sertraline (ZOLOFT) 50 MG tablet Take 25 mg by mouth daily.   No facility-administered encounter medications on file as of 12/27/2022.     ONCOLOGIC FAMILY HISTORY:  Family History  Problem Relation Age of Onset   Breast cancer Mother 62   Heart disease Father    CVA Father    Heart attack Father 80   Heart attack Paternal Grandfather    Colon cancer Neg Hx    Stomach cancer Neg Hx    Rectal cancer Neg Hx    Esophageal cancer Neg Hx      SOCIAL HISTORY:  Social History   Socioeconomic History   Marital status: Divorced    Spouse name: Not on file   Number of children: 3   Years of education: Masters   Highest education level: Not on file  Occupational History    Occupation: retired  Tobacco Use   Smoking status: Every Day    Current packs/day: 0.00    Types: Cigarettes    Last attempt to quit: 10/13/2016    Years since quitting: 6.2   Smokeless tobacco: Never   Tobacco comments:    5 cigarettes per week.    12/31/18  nicotene gum, lozenges  Vaping Use   Vaping status: Never Used  Substance and Sexual Activity   Alcohol use: Yes    Alcohol/week: 0.0 standard drinks of alcohol    Comment: less than 1/2 glass wine daily   Drug use: No   Sexual activity: Not on file  Other Topics Concern   Not on file  Social History Narrative   12/31/18 Lives alone   Caffeine- coffee, 1- 1 1/2 cups daily   Social Determinants of Health   Financial Resource Strain: Not on file  Food Insecurity: Not on file  Transportation Needs: Unknown (07/21/2021)   PRAPARE - Administrator, Civil Service (Medical): No    Lack of Transportation (Non-Medical): Not on file  Physical Activity: Not on file  Stress: No Stress Concern Present (07/21/2021)   [employer] of Occupational Health - Occupational Stress Questionnaire    Feeling of Stress : Not at all  Social Connections: Not on file  Intimate Partner Violence: Not on file     OBSERVATIONS/OBJECTIVE:  BP (!) 128/53 (BP Location: Left Arm, Patient Position: Sitting)   Pulse 61   Temp (!) 97.5 F (36.4 C) (Temporal)   Resp 16   Wt 118 lb 11.2 oz (53.8 kg)   SpO2 96%   BMI 21.71 kg/m   General appearance: Alert, oriented and in no acute distress Chest: Bilateral breasts inspected.  No palpable masses.  No regional adenopathy.   LABORATORY DATA:  None for this visit.  DIAGNOSTIC IMAGING:  None for this visit.   Total time spent: 20 min  *Total Encounter Time as defined by the Centers for Medicare and Medicaid Services includes, in addition to the face-to-face time of a patient visit (documented in the note above) non-face-to-face time: obtaining and reviewing outside history, ordering  and reviewing medications, tests or procedures, care coordination (communications with other health care professionals or caregivers) and documentation in the medical record.

## 2023-01-26 IMAGING — MG MM DIGITAL SCREENING BILAT W/ TOMO AND CAD
6 of 12 series · 6 of 36 positions shown · non-contrast
Comparison: Previous exam(s).

CLINICAL DATA: Screening.

EXAM:
DIGITAL SCREENING BILATERAL MAMMOGRAM WITH TOMOSYNTHESIS AND CAD
TECHNIQUE: Bilateral screening digital craniocaudal and mediolateral oblique
mammograms were obtained. Bilateral screening digital breast
tomosynthesis was performed. The images were evaluated with
computer-aided detection.

[R MLO synth-2D]
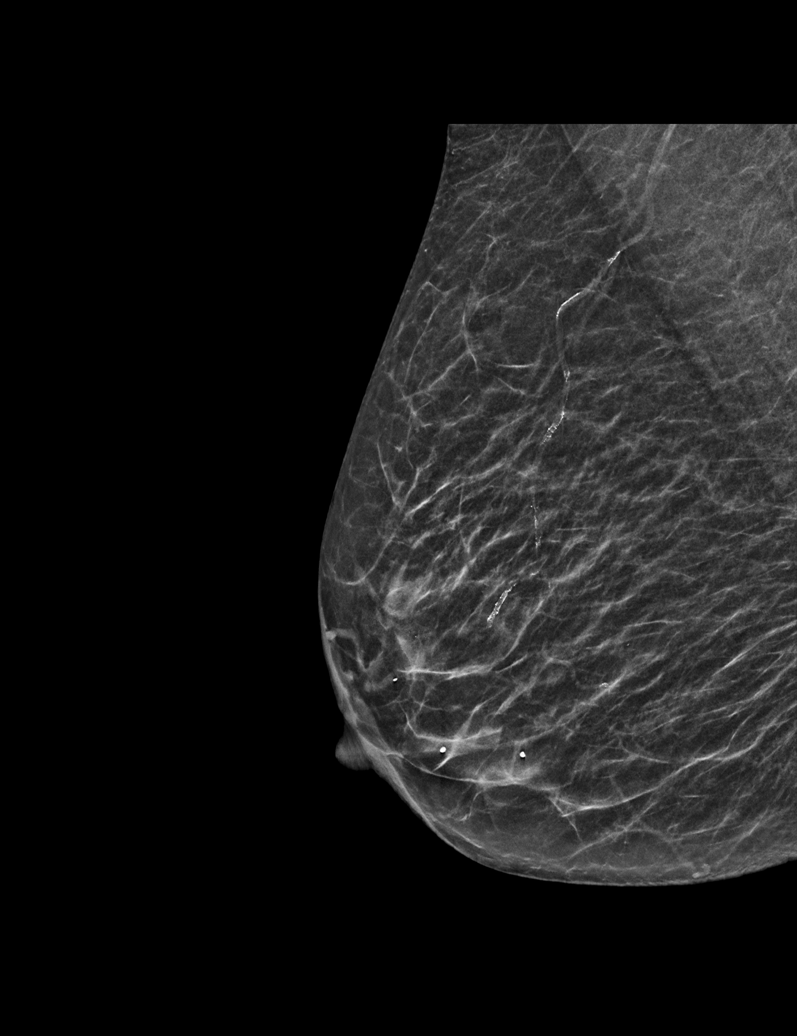

[L MLO synth-2D (1 of 2)]
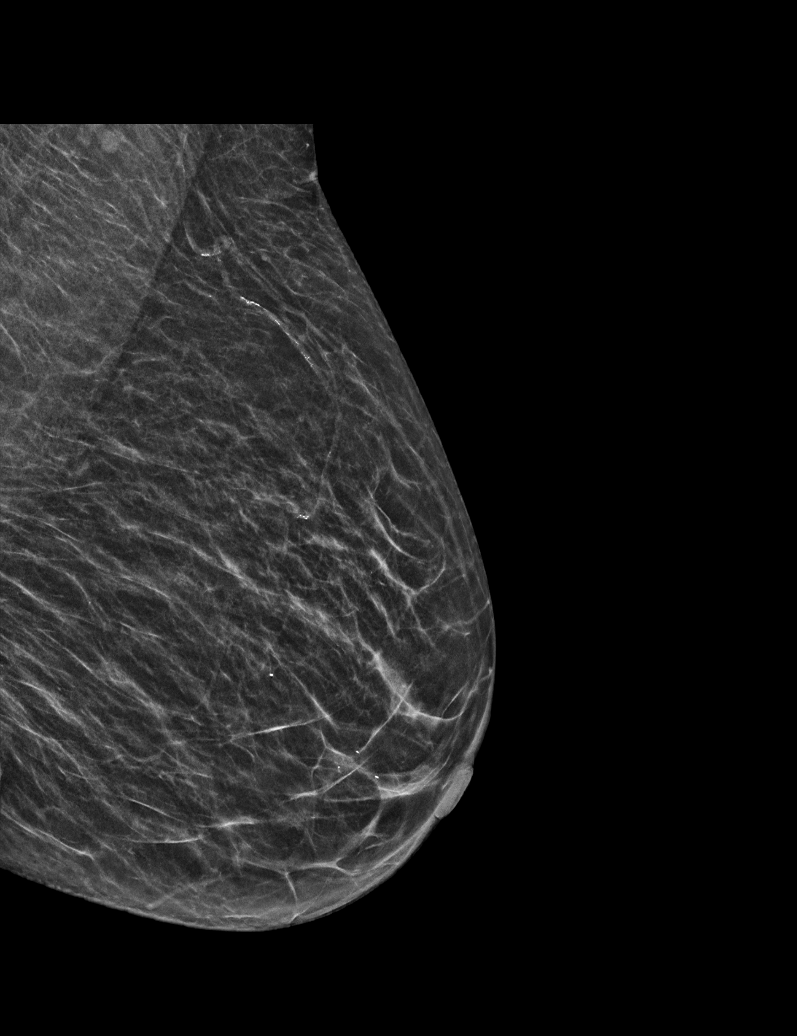

[L CC synth-2D]
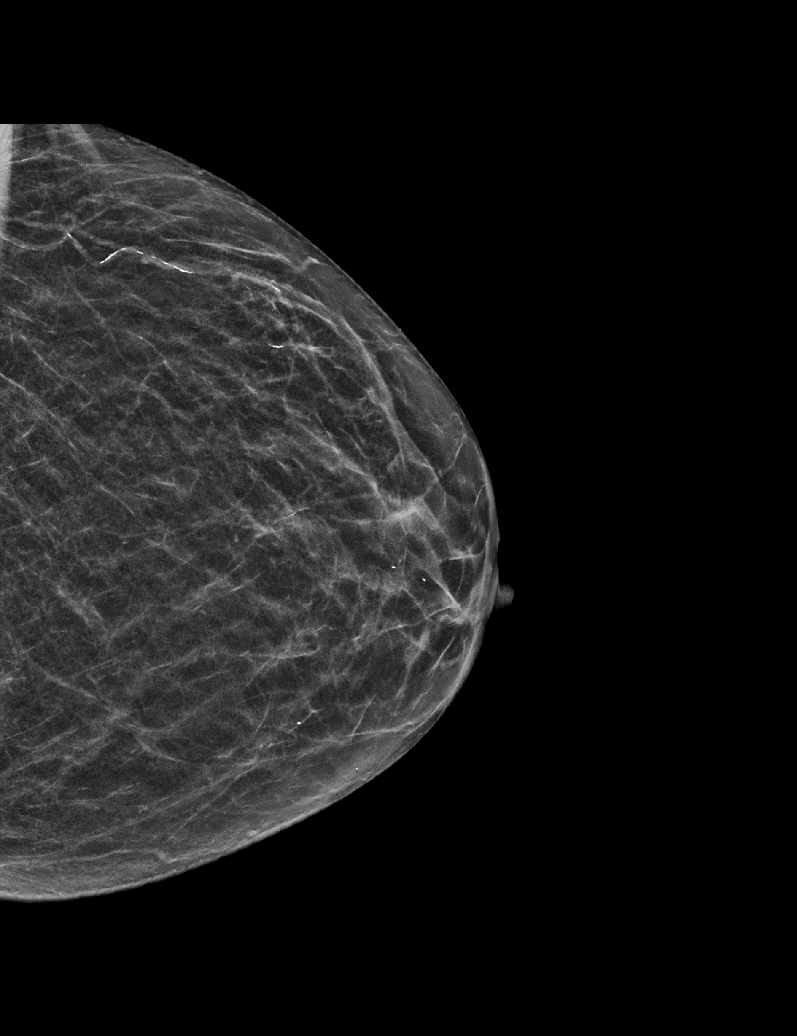

[R CC synth-2D]
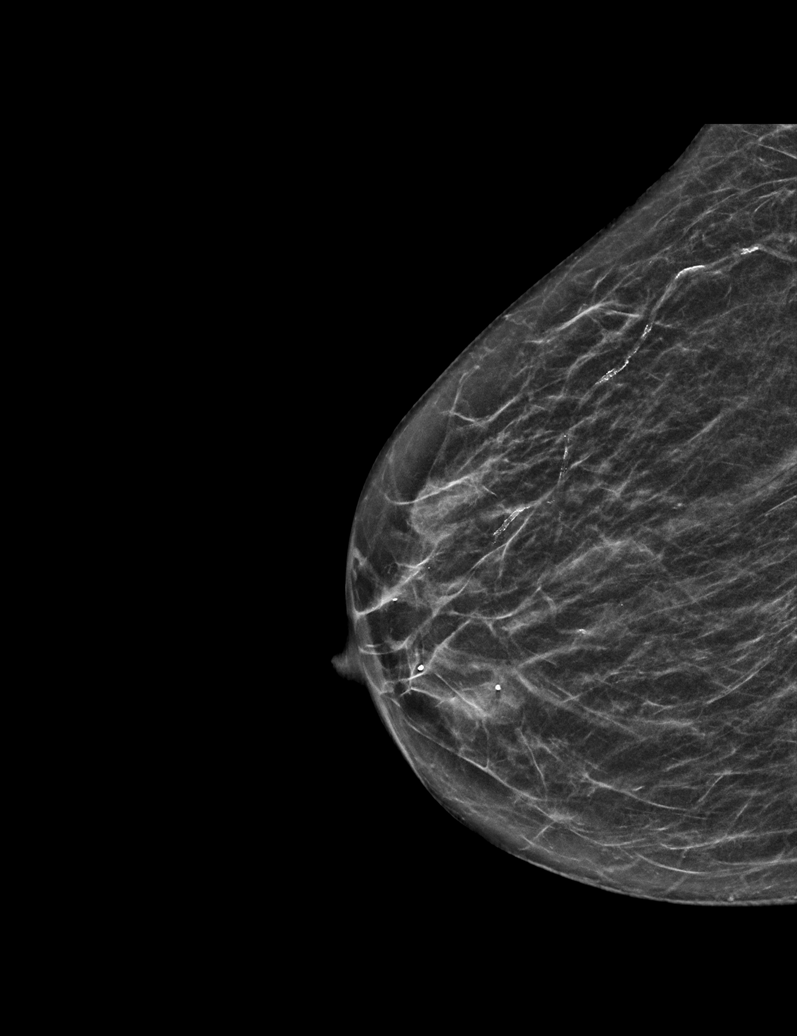

[L XCCL synth-2D]
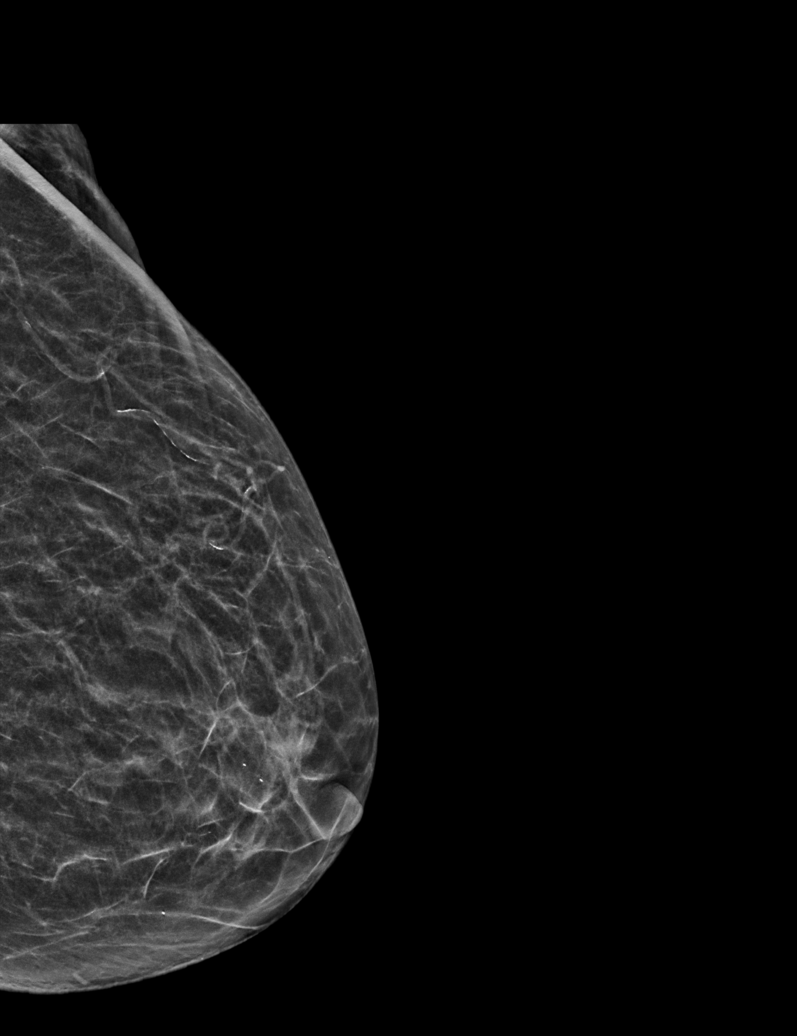

[L MLO synth-2D (2 of 2)]
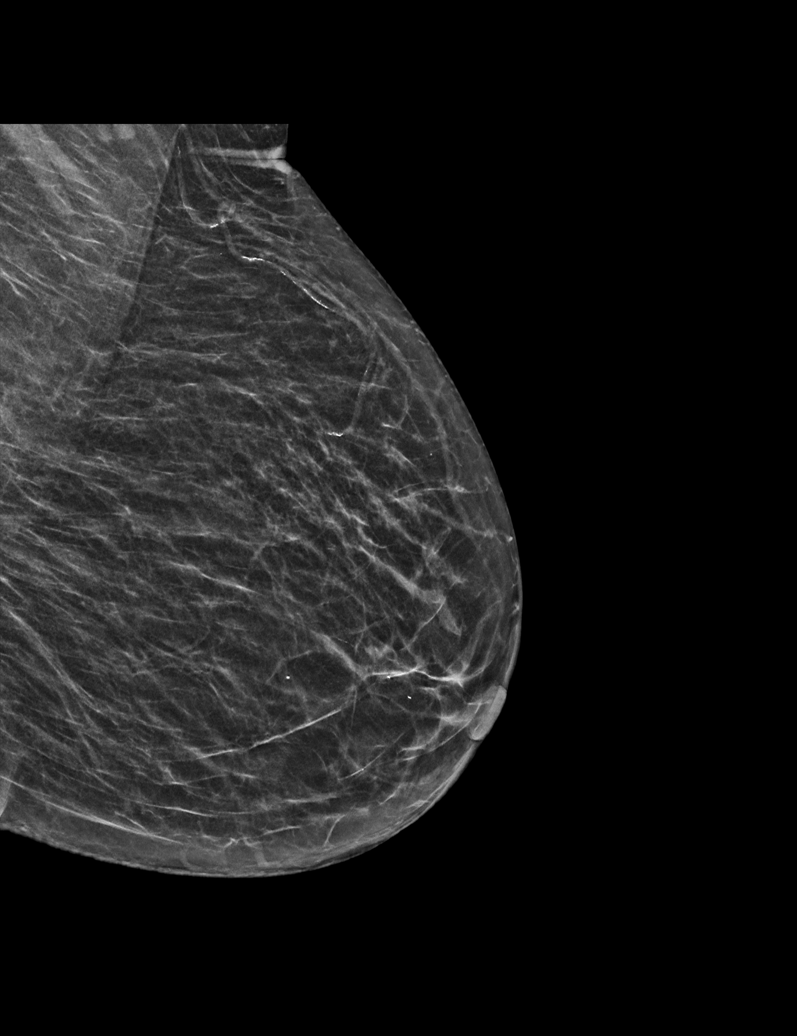

[6 of 36 positions shown; findings below may reference images not displayed]

ACR Breast Density Category b: There are scattered areas of
fibroglandular density.
FINDINGS: There are no findings suspicious for malignancy.
IMPRESSION: No mammographic evidence of malignancy. A result letter of this
screening mammogram will be mailed directly to the patient.

RECOMMENDATION:
Screening mammogram in one year. (Code:51-O-LD2)

BI-RADS CATEGORY  1: Negative.

## 2023-02-09 ENCOUNTER — Other Ambulatory Visit: Payer: Self-pay

## 2023-02-09 ENCOUNTER — Inpatient Hospital Stay (HOSPITAL_COMMUNITY)
Admission: EM | Admit: 2023-02-09 | Discharge: 2023-02-26 | DRG: 335 | Disposition: A | Discharge status: Skilled Nursing Facility | Payer: Medicare Other | Attending: Internal Medicine | Admitting: Internal Medicine

## 2023-02-09 ENCOUNTER — Emergency Department (HOSPITAL_COMMUNITY): Payer: Medicare Other

## 2023-02-09 ENCOUNTER — Encounter (HOSPITAL_COMMUNITY): Payer: Self-pay

## 2023-02-09 DIAGNOSIS — C50411 Malignant neoplasm of upper-outer quadrant of right female breast: Secondary | ICD-10-CM

## 2023-02-09 DIAGNOSIS — E871 Hypo-osmolality and hyponatremia: Secondary | ICD-10-CM | POA: Diagnosis present

## 2023-02-09 DIAGNOSIS — K529 Noninfective gastroenteritis and colitis, unspecified: Secondary | ICD-10-CM | POA: Diagnosis not present

## 2023-02-09 DIAGNOSIS — Z170 Estrogen receptor positive status [ER+]: Secondary | ICD-10-CM

## 2023-02-09 DIAGNOSIS — K9189 Other postprocedural complications and disorders of digestive system: Secondary | ICD-10-CM | POA: Diagnosis not present

## 2023-02-09 DIAGNOSIS — K56609 Unspecified intestinal obstruction, unspecified as to partial versus complete obstruction: Secondary | ICD-10-CM | POA: Diagnosis present

## 2023-02-09 DIAGNOSIS — Z888 Allergy status to other drugs, medicaments and biological substances status: Secondary | ICD-10-CM

## 2023-02-09 DIAGNOSIS — Z6820 Body mass index (BMI) 20.0-20.9, adult: Secondary | ICD-10-CM

## 2023-02-09 DIAGNOSIS — A084 Viral intestinal infection, unspecified: Secondary | ICD-10-CM | POA: Diagnosis present

## 2023-02-09 DIAGNOSIS — Z7982 Long term (current) use of aspirin: Secondary | ICD-10-CM

## 2023-02-09 DIAGNOSIS — F17210 Nicotine dependence, cigarettes, uncomplicated: Secondary | ICD-10-CM | POA: Diagnosis present

## 2023-02-09 DIAGNOSIS — K219 Gastro-esophageal reflux disease without esophagitis: Secondary | ICD-10-CM | POA: Insufficient documentation

## 2023-02-09 DIAGNOSIS — R1084 Generalized abdominal pain: Principal | ICD-10-CM

## 2023-02-09 DIAGNOSIS — K565 Intestinal adhesions [bands], unspecified as to partial versus complete obstruction: Principal | ICD-10-CM | POA: Diagnosis present

## 2023-02-09 DIAGNOSIS — Z8249 Family history of ischemic heart disease and other diseases of the circulatory system: Secondary | ICD-10-CM

## 2023-02-09 DIAGNOSIS — R109 Unspecified abdominal pain: Secondary | ICD-10-CM | POA: Diagnosis not present

## 2023-02-09 DIAGNOSIS — Z90710 Acquired absence of both cervix and uterus: Secondary | ICD-10-CM

## 2023-02-09 DIAGNOSIS — G629 Polyneuropathy, unspecified: Secondary | ICD-10-CM | POA: Diagnosis present

## 2023-02-09 DIAGNOSIS — R188 Other ascites: Secondary | ICD-10-CM | POA: Diagnosis present

## 2023-02-09 DIAGNOSIS — Z91048 Other nonmedicinal substance allergy status: Secondary | ICD-10-CM

## 2023-02-09 DIAGNOSIS — E785 Hyperlipidemia, unspecified: Secondary | ICD-10-CM | POA: Insufficient documentation

## 2023-02-09 DIAGNOSIS — Z885 Allergy status to narcotic agent status: Secondary | ICD-10-CM

## 2023-02-09 DIAGNOSIS — K567 Ileus, unspecified: Secondary | ICD-10-CM | POA: Diagnosis not present

## 2023-02-09 DIAGNOSIS — Z886 Allergy status to analgesic agent status: Secondary | ICD-10-CM

## 2023-02-09 DIAGNOSIS — Z79811 Long term (current) use of aromatase inhibitors: Secondary | ICD-10-CM

## 2023-02-09 DIAGNOSIS — E43 Unspecified severe protein-calorie malnutrition: Secondary | ICD-10-CM | POA: Insufficient documentation

## 2023-02-09 DIAGNOSIS — E876 Hypokalemia: Secondary | ICD-10-CM | POA: Diagnosis present

## 2023-02-09 LAB — CBC
HCT: 42.2 % (ref 36.0–46.0)
Hemoglobin: 14.2 g/dL (ref 12.0–15.0)
MCH: 33.8 pg (ref 26.0–34.0)
MCHC: 33.6 g/dL (ref 30.0–36.0)
MCV: 100.5 fL — ABNORMAL HIGH (ref 80.0–100.0)
Platelets: 279 10*3/uL (ref 150–400)
RBC: 4.2 MIL/uL (ref 3.87–5.11)
RDW: 12.4 % (ref 11.5–15.5)
WBC: 13.1 10*3/uL — ABNORMAL HIGH (ref 4.0–10.5)
nRBC: 0 % (ref 0.0–0.2)

## 2023-02-09 LAB — COMPREHENSIVE METABOLIC PANEL
ALT: 18 U/L (ref 0–44)
AST: 29 U/L (ref 15–41)
Albumin: 4.2 g/dL (ref 3.5–5.0)
Alkaline Phosphatase: 45 U/L (ref 38–126)
Anion gap: 10 (ref 5–15)
BUN: 26 mg/dL — ABNORMAL HIGH (ref 8–23)
CO2: 25 mmol/L (ref 22–32)
Calcium: 9.8 mg/dL (ref 8.9–10.3)
Chloride: 103 mmol/L (ref 98–111)
Creatinine, Ser: 0.83 mg/dL (ref 0.44–1.00)
GFR, Estimated: >60 mL/min (ref >60)
Glucose, Bld: 121 mg/dL — ABNORMAL HIGH (ref 70–99)
Potassium: 4 mmol/L (ref 3.5–5.1)
Sodium: 138 mmol/L (ref 135–145)
Total Bilirubin: 0.7 mg/dL (ref <1.2)
Total Protein: 7.3 g/dL (ref 6.5–8.1)

## 2023-02-09 LAB — URINALYSIS, ROUTINE W REFLEX MICROSCOPIC
Bilirubin Urine: NEGATIVE
Glucose, UA: NEGATIVE mg/dL
Hgb urine dipstick: NEGATIVE
Ketones, ur: 5 mg/dL — AB
Leukocytes,Ua: NEGATIVE
Nitrite: NEGATIVE
Protein, ur: NEGATIVE mg/dL
Specific Gravity, Urine: 1.029 (ref 1.005–1.030)
pH: 6 (ref 5.0–8.0)

## 2023-02-09 LAB — I-STAT CG4 LACTIC ACID, ED
Lactic Acid, Venous: 0.8 mmol/L (ref 0.5–1.9)
Lactic Acid, Venous: 0.9 mmol/L (ref 0.5–1.9)

## 2023-02-09 LAB — LIPASE, BLOOD: Lipase: 88 U/L — ABNORMAL HIGH (ref 11–51)

## 2023-02-09 LAB — MRSA NEXT GEN BY PCR, NASAL: MRSA by PCR Next Gen: NOT DETECTED

## 2023-02-09 MED ORDER — ONDANSETRON 4 MG PO TBDP
4.0000 mg | ORAL_TABLET | Freq: Once | ORAL | Status: AC | PRN
  Administered 2023-02-09: 4 mg via ORAL
  Filled 2023-02-09: qty 1

## 2023-02-09 MED ORDER — IOHEXOL 300 MG/ML  SOLN
100.0000 mL | Freq: Once | INTRAMUSCULAR | Status: AC | PRN
  Administered 2023-02-09: 100 mL via INTRAVENOUS

## 2023-02-09 MED ORDER — ENOXAPARIN SODIUM 40 MG/0.4ML IJ SOSY
40.0000 mg | PREFILLED_SYRINGE | INTRAMUSCULAR | Status: DC
  Administered 2023-02-09 – 2023-02-10 (×2): 40 mg via SUBCUTANEOUS
  Filled 2023-02-09 (×2): qty 0.4

## 2023-02-09 MED ORDER — SODIUM CHLORIDE 0.9 % IV BOLUS
1000.0000 mL | Freq: Once | INTRAVENOUS | Status: AC
  Administered 2023-02-09: 1000 mL via INTRAVENOUS

## 2023-02-09 MED ORDER — PREGABALIN 75 MG PO CAPS
150.0000 mg | ORAL_CAPSULE | Freq: Every day | ORAL | Status: DC
  Administered 2023-02-10: 150 mg via ORAL
  Filled 2023-02-09: qty 2

## 2023-02-09 MED ORDER — PANTOPRAZOLE SODIUM 40 MG PO TBEC
40.0000 mg | DELAYED_RELEASE_TABLET | Freq: Every day | ORAL | Status: DC
  Administered 2023-02-10 – 2023-02-12 (×3): 40 mg via ORAL
  Filled 2023-02-09 (×3): qty 1

## 2023-02-09 MED ORDER — ANASTROZOLE 1 MG PO TABS
1.0000 mg | ORAL_TABLET | Freq: Every day | ORAL | Status: DC
  Administered 2023-02-10 – 2023-02-15 (×5): 1 mg via ORAL
  Filled 2023-02-09 (×6): qty 1

## 2023-02-09 MED ORDER — MORPHINE SULFATE (PF) 2 MG/ML IV SOLN
2.0000 mg | Freq: Once | INTRAVENOUS | Status: AC
  Administered 2023-02-09: 2 mg via INTRAVENOUS
  Filled 2023-02-09: qty 1

## 2023-02-09 MED ORDER — HYDROCODONE-ACETAMINOPHEN 5-325 MG PO TABS
1.0000 | ORAL_TABLET | ORAL | Status: DC | PRN
  Administered 2023-02-11: 2 via ORAL
  Filled 2023-02-09: qty 2

## 2023-02-09 MED ORDER — ONDANSETRON HCL 4 MG/2ML IJ SOLN
4.0000 mg | Freq: Four times a day (QID) | INTRAMUSCULAR | Status: DC | PRN
  Administered 2023-02-09 – 2023-02-14 (×7): 4 mg via INTRAVENOUS
  Filled 2023-02-09 (×7): qty 2

## 2023-02-09 MED ORDER — PIPERACILLIN-TAZOBACTAM 3.375 G IVPB
3.3750 g | Freq: Once | INTRAVENOUS | Status: AC
Start: 2023-02-09 — End: 2023-02-09
  Administered 2023-02-09: 3.375 g via INTRAVENOUS
  Filled 2023-02-09: qty 50

## 2023-02-09 MED ORDER — ROSUVASTATIN CALCIUM 10 MG PO TABS
10.0000 mg | ORAL_TABLET | ORAL | Status: DC
  Administered 2023-02-11: 10 mg via ORAL
  Filled 2023-02-09: qty 1

## 2023-02-09 MED ORDER — MORPHINE SULFATE (PF) 2 MG/ML IV SOLN
2.0000 mg | INTRAVENOUS | Status: DC | PRN
  Administered 2023-02-09 (×2): 2 mg via INTRAVENOUS
  Filled 2023-02-09 (×2): qty 1

## 2023-02-09 MED ORDER — ACETAMINOPHEN 650 MG RE SUPP: 650.0000 mg | Freq: Four times a day (QID) | RECTAL | Status: DC | PRN

## 2023-02-09 MED ORDER — ACETAMINOPHEN 325 MG PO TABS
650.0000 mg | ORAL_TABLET | Freq: Four times a day (QID) | ORAL | Status: DC | PRN
Start: 2023-02-09 — End: 2023-02-15

## 2023-02-09 MED ORDER — LACTATED RINGERS IV SOLN: INTRAVENOUS | Status: AC

## 2023-02-09 NOTE — ED Notes (Signed)
ED TO INPATIENT HANDOFF REPORT  ED Nurse Name and Phone #: Aleksa Collinsworth   S Name/Age/Gender Candice Brown 89 y.o. female Room/Bed: WHALB/WHALB  Code Status   Code Status: Full Code  Home/SNF/Other Home Patient oriented to: self, place, time, and situation Is this baseline? Yes   Triage Complete: Triage complete  Chief Complaint Gastroenteritis [K52.9]  Triage Note Pt BIB [facility] coming from [facility]. Pt woke up this AM with intermittent abd cramps with associated N/V and normal BMs. Pt locates pain to lower abd at "the bikini line." Pt with hx of diverticulitis.   EMS Vitals  140/80 SpO2 97% on R/A HR 60 RR 20   Allergies Allergies  Allergen Reactions   Linzess [Linaclotide] Diarrhea    Increased leg cramps   Oxycodone Nausea Only     ( made very sick)   Simvastatin Other (See Comments)    Leg cramps   Benzalkonium Chloride Rash    In neosporin   Neosporin [Neomycin-Polymyxin-Gramicidin] Rash   Tape Rash    Adhesive patches of any kind    Level of Care/Admitting Diagnosis ED Disposition     ED Disposition  Admit   Condition  --   Comment  Hospital Area: [facility] [[id number]]  Level of Care: Med-Surg [16]  May place patient in observation at [facility] or [facility] if equivalent level of care is available:: No  Covid Evaluation: Asymptomatic - no recent exposure (last 10 days) testing not required  Diagnosis: Gastroenteritis [[id number]]  Admitting Physician: Alan Mulder [[id number]]  Attending Physician: Alan Mulder [phone]          B Medical/Surgery History Past Medical History:  Diagnosis Date   Anemia    Arthritis    Colon polyps    hyperplastic   Complication of anesthesia    hard to wake up   Depression    Diverticulosis    GERD (gastroesophageal reflux disease)    Hiatal hernia    Hyperlipidemia    IBS (irritable bowel syndrome)    Internal hemorrhoids    Lung nodule    Occipital  neuralgia    Peripheral neuropathy    Past Surgical History:  Procedure Laterality Date   ABDOMINAL HYSTERECTOMY  1972   APPENDECTOMY  1959   BREAST LUMPECTOMY WITH RADIOACTIVE SEED LOCALIZATION Right 07/18/2021   Procedure: RIGHT BREAST LUMPECTOMY WITH RADIOACTIVE SEED LOCALIZATION;  Surgeon: Almond Lint, MD;  Location: [facility];  Service: General;  Laterality: Right;   CATARACT EXTRACTION Bilateral    left 03/2014, right eye 06/2016   CHOLECYSTECTOMY       A IV Location/Drains/Wounds Patient Lines/Drains/Airways Status     Active Line/Drains/Airways     Name Placement date Placement time Site Days   Peripheral IV 02/09/23 20 G Anterior;Left;Proximal Forearm 02/09/23  1135  Forearm  less than 1            Intake/Output Last 24 hours No intake or output data in the 24 hours ending 02/09/23 1844  Labs/Imaging Results for orders placed or performed during the hospital encounter of 02/09/23 (from the past 48 hours)  Lipase, blood     Status: Abnormal   Collection Time: 02/09/23 11:35 AM  Result Value Ref Range   Lipase 88 (H) 11 - 51 U/L    Comment: Performed at [facility], [address], [place], [place] [address]  Comprehensive metabolic panel     Status: Abnormal   Collection Time: 02/09/23  11:35 AM  Result Value Ref Range   Sodium 138 135 - 145 mmol/L   Potassium 4.0 3.5 - 5.1 mmol/L   Chloride 103 98 - 111 mmol/L   CO2 25 22 - 32 mmol/L   Glucose, Bld 121 (H) 70 - 99 mg/dL    Comment: Glucose reference range applies only to samples taken after fasting for at least 8 hours.   BUN 26 (H) 8 - 23 mg/dL   Creatinine, Ser 2.54 0.44 - 1.00 mg/dL   Calcium 9.8 8.9 - 27.0 mg/dL   Total Protein 7.3 6.5 - 8.1 g/dL   Albumin 4.2 3.5 - 5.0 g/dL   AST 29 15 - 41 U/L   ALT 18 0 - 44 U/L   Alkaline Phosphatase 45 38 - 126 U/L   Total Bilirubin 0.7 <1.2 mg/dL   GFR, Estimated >62 >37 mL/min    Comment: (NOTE) Calculated using the  CKD-EPI Creatinine Equation (2021)    Anion gap 10 5 - 15    Comment: Performed at [facility], [address] [place], [place] [address]  CBC     Status: Abnormal   Collection Time: 02/09/23 11:35 AM  Result Value Ref Range   WBC 13.1 (H) 4.0 - 10.5 K/uL   RBC 4.20 3.87 - 5.11 MIL/uL   Hemoglobin 14.2 12.0 - 15.0 g/dL   HCT 51.7 61.6 - 07.3 %   MCV 100.5 (H) 80.0 - 100.0 fL   MCH 33.8 26.0 - 34.0 pg   MCHC 33.6 30.0 - 36.0 g/dL   RDW 71.0 62.6 - 94.8 %   Platelets 279 150 - 400 K/uL   nRBC 0.0 0.0 - 0.2 %    Comment: Performed at [facility], [address], [place], [place] [address]  Urinalysis, Routine w reflex microscopic -Urine, Clean Catch     Status: Abnormal   Collection Time: 02/09/23  3:38 PM  Result Value Ref Range   Color, Urine STRAW (A) YELLOW   APPearance CLEAR CLEAR   Specific Gravity, Urine 1.029 1.005 - 1.030   pH 6.0 5.0 - 8.0   Glucose, UA NEGATIVE NEGATIVE mg/dL   Hgb urine dipstick NEGATIVE NEGATIVE   Bilirubin Urine NEGATIVE NEGATIVE   Ketones, ur 5 (A) NEGATIVE mg/dL   Protein, ur NEGATIVE NEGATIVE mg/dL   Nitrite NEGATIVE NEGATIVE   Leukocytes,Ua NEGATIVE NEGATIVE    Comment: Performed at [facility], [address], [place], [place] [address]  I-Stat CG4 Lactic Acid     Status: None   Collection Time: 02/09/23  4:48 PM  Result Value Ref Range   Lactic Acid, Venous 0.8 0.5 - 1.9 mmol/L   CT ABDOMEN PELVIS W CONTRAST Result Date: 02/09/2023 CLINICAL DATA:  Acute abdominal pain, nonlocalized EXAM: CT ABDOMEN AND PELVIS WITH CONTRAST TECHNIQUE: Multidetector CT imaging of the abdomen and pelvis was performed using the standard protocol following bolus administration of intravenous contrast. RADIATION DOSE REDUCTION: This exam was performed according to the departmental dose-optimization program which includes automated exposure control, adjustment of the mA and/or kV according to patient size  and/or use of iterative reconstruction technique. CONTRAST:  OMNIPAQUE IOHEXOL 300 MG/ML  SOLN COMPARISON:  CT 02/10/2020 FINDINGS: Lower chest: No pleural or pericardial effusion. Scattered coronary and aortic calcifications. Coarse subpleural linear scarring or atelectasis in cystic change posteriorly in the lung bases. Hepatobiliary: No focal liver abnormality is seen. Status post cholecystectomy. No biliary dilatation. Pancreas: Unremarkable. No pancreatic ductal dilatation or  surrounding inflammatory changes. Spleen: Normal in size without focal abnormality. Adrenals/Urinary Tract: No adrenal mass. Symmetric renal enhancement without urolithiasis or hydronephrosis. Multiple cortical lesions in both kidneys, some of which can be characterized as cysts, largest 1.1 cm 21 HU left upper pole; no followup recommended. Urinary bladder is distended. Stomach/Bowel: Stomach is partially distended, without acute finding. Fluid distended small bowel loops in the left lower abdomen. Transition zone in the right lower quadrant with a short loop of small bowel showing some circumferential wall thickening, and relatively decreased mucosal enhancement, with regional inflammatory/edematous changes in the mesentery (Im26-29,Se82) . post appendectomy. The colon is partially distended, with multiple descending and sigmoid diverticula; no adjacent inflammatory change. Vascular/Lymphatic: Heavy calcified aortoiliac atheromatous plaque without aneurysm. Portal vein patent. No abdominal or pelvic adenopathy. Reproductive: Status post hysterectomy. No adnexal masses. Other: Small amount of free fluid in the pelvis. Small volume right lower quadrant ascites adjacent to the abnormal loop of bowel as above. No upper abdominal ascites. No free air. Musculoskeletal: Thoracolumbar lower levoscoliosis apex L2 with multilevel lumbar spondylitic change. Grade 1 anterolisthesis L5-S1 attributed to advanced facet DJD. IMPRESSION: 1. Short  segment of abnormal small bowel in the right lower quadrant with regional inflammatory/edematous changes in the mesentery and small volume ascites. Appearance is nonspecific, considerations include localized infectious, inflammatory, or ischemic enteritis. 2. Descending and sigmoid diverticulosis. 3.  Aortic Atherosclerosis (ICD10-I70.0). Electronically Signed   By: Corlis Leak M.D.   On: 02/09/2023 16:24    Pending Labs Unresulted Labs (From admission, onward)     Start     Ordered   02/10/23 0500  Basic metabolic panel  Tomorrow morning,   R        02/09/23 1825   02/10/23 0500  CBC  Tomorrow morning,   R        02/09/23 1825   02/09/23 1816  Gastrointestinal Panel by PCR , Stool  (Gastrointestinal Panel by PCR, Stool                                                                                                                                                     **Does Not include CLOSTRIDIUM DIFFICILE testing. **If CDIFF testing is needed, place order from the "C Difficile Testing" order set.**)  Once,   URGENT        02/09/23 1815   02/09/23 1651  Blood culture (routine x 2)  BLOOD CULTURE X 2,   R (with STAT occurrences)      02/09/23 1650            Vitals/Pain Today's Vitals   02/09/23 1108 02/09/23 1111 02/09/23 1418  BP: (!) 143/91  (!) 123/49  Pulse: 63  (!) 59  Resp: 16  16  Temp: 97.8 F (36.6 C)  97.8 F (36.6 C)  TempSrc: Oral  SpO2: 99%  93%  Weight:  53.5 kg   Height:  5\' 3"  (1.6 m)   PainSc:  1      Isolation Precautions Enteric precautions (UV disinfection)  Medications Medications  morphine (PF) 2 MG/ML injection 2 mg (2 mg Intravenous Given 02/09/23 1828)  piperacillin-tazobactam (ZOSYN) IVPB 3.375 g (3.375 g Intravenous New Bag/Given 02/09/23 1745)  ondansetron (ZOFRAN) injection 4 mg (4 mg Intravenous Given 02/09/23 1828)  anastrozole (ARIMIDEX) tablet 1 mg (has no administration in time range)  rosuvastatin (CRESTOR) tablet 10 mg (has no  administration in time range)  pantoprazole (PROTONIX) EC tablet 40 mg (has no administration in time range)  pregabalin (LYRICA) capsule 150 mg (has no administration in time range)  enoxaparin (LOVENOX) injection 40 mg (has no administration in time range)  acetaminophen (TYLENOL) tablet 650 mg (has no administration in time range)    Or  acetaminophen (TYLENOL) suppository 650 mg (has no administration in time range)  HYDROcodone-acetaminophen (NORCO/VICODIN) 5-325 MG per tablet 1-2 tablet (has no administration in time range)  ondansetron (ZOFRAN-ODT) disintegrating tablet 4 mg (4 mg Oral Given 02/09/23 1118)  sodium chloride 0.9 % bolus 1,000 mL (0 mLs Intravenous Stopped 02/09/23 1557)  morphine (PF) 2 MG/ML injection 2 mg (2 mg Intravenous Given 02/09/23 1333)  iohexol (OMNIPAQUE) 300 MG/ML solution 100 mL (100 mLs Intravenous Contrast Given 02/09/23 1500)    Mobility walks with person assist     Focused Assessments Gastro, N/V   R Recommendations: See Admitting Provider Note  Report given to:   Additional Notes: Enteric precautions

## 2023-02-09 NOTE — Progress Notes (Signed)
ED Pharmacy Antibiotic Sign Off An antibiotic consult was received from an ED provider for zosyn per pharmacy dosing for IAI. A chart review was completed to assess appropriateness.   The following one time order(s) were placed:  Zosyn 3.375g IV q8 (extended interval infusion)   Further antibiotic and/or antibiotic pharmacy consults should be ordered by the admitting provider if indicated.   Thank you for allowing pharmacy to be a part of this patient's care.   Berkley Harvey, [facility]  Clinical Pharmacist 02/09/23 4:52 PM

## 2023-02-09 NOTE — Plan of Care (Signed)

## 2023-02-09 NOTE — ED Provider Notes (Signed)
[place] EMERGENCY DEPARTMENT AT [facility] Provider Note   CSN: [id number] Arrival date & time: 02/09/23  1102     History Chief Complaint  Patient presents with   Abdominal Pain    HPI Candice Brown is a 89 y.o. female presenting for chief complaint of left and right lower quadrant abdominal pain.  States that it started this morning at 9 AM sudden onset nausea vomiting accompanied the symptoms.  States that she was asymptomatic yesterday.  History of diverticulitis with similar presentation.  Status post hysterectomy, appendectomy, cholecystectomy.  Last bowel movement yesterday was otherwise normal.  Having a spasming sensation throughout her lower abdomen at this time.   Patient's recorded medical, surgical, social, medication list and allergies were reviewed in the Snapshot window as part of the initial history.   Review of Systems   Review of Systems  Constitutional:  Negative for chills and fever.  HENT:  Negative for ear pain and sore throat.   Eyes:  Negative for pain and visual disturbance.  Respiratory:  Negative for cough and shortness of breath.   Cardiovascular:  Negative for chest pain and palpitations.  Gastrointestinal:  Positive for abdominal pain, nausea and vomiting.  Genitourinary:  Negative for dysuria and hematuria.  Musculoskeletal:  Negative for arthralgias and back pain.  Skin:  Negative for color change and rash.  Neurological:  Negative for seizures and syncope.  All other systems reviewed and are negative.   Physical Exam Updated Vital Signs BP (!) 143/55 (BP Location: Left Arm)   Pulse 66   Temp 98.8 F (37.1 C) (Oral)   Resp 15   Ht 5\' 3"  (1.6 m)   Wt 53.5 kg   SpO2 92%   BMI 20.90 kg/m  Physical Exam Vitals and nursing note reviewed.  Constitutional:      General: She is not in acute distress.    Appearance: She is well-developed.  HENT:     Head: Normocephalic and atraumatic.  Eyes:     Conjunctiva/sclera:  Conjunctivae normal.  Cardiovascular:     Rate and Rhythm: Normal rate and regular rhythm.     Heart sounds: No murmur heard. Pulmonary:     Effort: Pulmonary effort is normal. No respiratory distress.     Breath sounds: Normal breath sounds.  Abdominal:     General: There is no distension.     Palpations: Abdomen is soft.     Tenderness: There is abdominal tenderness in the right lower quadrant, suprapubic area and left lower quadrant. There is no right CVA tenderness, left CVA tenderness or guarding.  Musculoskeletal:        General: No swelling or tenderness. Normal range of motion.     Cervical back: Neck supple.  Skin:    General: Skin is warm and dry.  Neurological:     General: No focal deficit present.     Mental Status: She is alert and oriented to person, place, and time. Mental status is at baseline.     Cranial Nerves: No cranial nerve deficit.      ED Course/ Medical Decision Making/ A&P    Procedures Procedures   Medications Ordered in ED Medications  morphine (PF) 2 MG/ML injection 2 mg (2 mg Intravenous Given 02/09/23 1828)  ondansetron (ZOFRAN) injection 4 mg (4 mg Intravenous Given 02/09/23 1828)  anastrozole (ARIMIDEX) tablet 1 mg (has no administration in time range)  rosuvastatin (CRESTOR) tablet 10 mg (has no administration in time range)  pantoprazole (PROTONIX)  EC tablet 40 mg (has no administration in time range)  pregabalin (LYRICA) capsule 150 mg (has no administration in time range)  enoxaparin (LOVENOX) injection 40 mg (40 mg Subcutaneous Given 02/09/23 2105)  acetaminophen (TYLENOL) tablet 650 mg (has no administration in time range)    Or  acetaminophen (TYLENOL) suppository 650 mg (has no administration in time range)  HYDROcodone-acetaminophen (NORCO/VICODIN) 5-325 MG per tablet 1-2 tablet (has no administration in time range)  lactated ringers infusion (has no administration in time range)  ondansetron (ZOFRAN-ODT) disintegrating tablet 4  mg (4 mg Oral Given 02/09/23 1118)  sodium chloride 0.9 % bolus 1,000 mL (0 mLs Intravenous Stopped 02/09/23 1557)  morphine (PF) 2 MG/ML injection 2 mg (2 mg Intravenous Given 02/09/23 1333)  iohexol (OMNIPAQUE) 300 MG/ML solution 100 mL (100 mLs Intravenous Contrast Given 02/09/23 1500)  piperacillin-tazobactam (ZOSYN) IVPB 3.375 g (3.375 g Intravenous New Bag/Given 02/09/23 1745)   Medical Decision Making:   Candice Brown is a 89 y.o. female who presented to the ED today with abdominal pain, detailed above.    Additional history discussed with patient's family/caregivers.  Patient placed on continuous vitals and telemetry monitoring while in ED which was reviewed periodically.  Complete initial physical exam performed, notably the patient  was HDS in NAD.     Reviewed and confirmed nursing documentation for past medical history, family history, social history.    Initial Assessment:   With the patient's presentation of abdominal pain, most likely diagnosis is nonspecific etiology. Other diagnoses were considered including (but not limited to) gastroenteritis, colitis, small bowel obstruction, appendicitis, cholecystitis, pancreatitis, nephrolithiasis, UTI, pyleonephritis. These are considered less likely due to history of present illness and physical exam findings.   This is most consistent with an acute life/limb threatening illness complicated by underlying chronic conditions.   Initial Plan:  CBC/CMP to evaluate for underlying infectious/metabolic etiology for patient's abdominal pain  Lipase to evaluate for pancreatitis  EKG to evaluate for cardiac source of pain  CTAB/Pelvis with contrast to evaluate for structural/surgical etiology of patients' severe abdominal pain.  Urinalysis and repeat physical assessment to evaluate for UTI/Pyelonpehritis  Empiric management of symptoms with escalating pain control and antiemetics as needed.   Initial Study Results:   Laboratory  All  laboratory results reviewed without evidence of clinically relevant pathology.    EKG EKG was reviewed independently. Rate, rhythm, axis, intervals all examined and without medically relevant abnormality. ST segments without concerns for elevations.    Radiology All images reviewed independently. Agree with radiology report at this time.   CT ABDOMEN PELVIS W CONTRAST Result Date: 02/09/2023 CLINICAL DATA:  Acute abdominal pain, nonlocalized EXAM: CT ABDOMEN AND PELVIS WITH CONTRAST TECHNIQUE: Multidetector CT imaging of the abdomen and pelvis was performed using the standard protocol following bolus administration of intravenous contrast. RADIATION DOSE REDUCTION: This exam was performed according to the departmental dose-optimization program which includes automated exposure control, adjustment of the mA and/or kV according to patient size and/or use of iterative reconstruction technique. CONTRAST:  OMNIPAQUE IOHEXOL 300 MG/ML  SOLN COMPARISON:  CT 02/10/2020 FINDINGS: Lower chest: No pleural or pericardial effusion. Scattered coronary and aortic calcifications. Coarse subpleural linear scarring or atelectasis in cystic change posteriorly in the lung bases. Hepatobiliary: No focal liver abnormality is seen. Status post cholecystectomy. No biliary dilatation. Pancreas: Unremarkable. No pancreatic ductal dilatation or surrounding inflammatory changes. Spleen: Normal in size without focal abnormality. Adrenals/Urinary Tract: No adrenal mass. Symmetric renal enhancement without urolithiasis or hydronephrosis.  Multiple cortical lesions in both kidneys, some of which can be characterized as cysts, largest 1.1 cm 21 HU left upper pole; no followup recommended. Urinary bladder is distended. Stomach/Bowel: Stomach is partially distended, without acute finding. Fluid distended small bowel loops in the left lower abdomen. Transition zone in the right lower quadrant with a short loop of small bowel showing some  circumferential wall thickening, and relatively decreased mucosal enhancement, with regional inflammatory/edematous changes in the mesentery (Im26-29,Se82) . post appendectomy. The colon is partially distended, with multiple descending and sigmoid diverticula; no adjacent inflammatory change. Vascular/Lymphatic: Heavy calcified aortoiliac atheromatous plaque without aneurysm. Portal vein patent. No abdominal or pelvic adenopathy. Reproductive: Status post hysterectomy. No adnexal masses. Other: Small amount of free fluid in the pelvis. Small volume right lower quadrant ascites adjacent to the abnormal loop of bowel as above. No upper abdominal ascites. No free air. Musculoskeletal: Thoracolumbar lower levoscoliosis apex L2 with multilevel lumbar spondylitic change. Grade 1 anterolisthesis L5-S1 attributed to advanced facet DJD. IMPRESSION: 1. Short segment of abnormal small bowel in the right lower quadrant with regional inflammatory/edematous changes in the mesentery and small volume ascites. Appearance is nonspecific, considerations include localized infectious, inflammatory, or ischemic enteritis. 2. Descending and sigmoid diverticulosis. 3.  Aortic Atherosclerosis (ICD10-I70.0). Electronically Signed   By: Corlis Leak M.D.   On: 02/09/2023 16:24     Consults: Case discussed with general surgery Dr. Jearld Fenton.   Final Reassessment and Plan:   Given the findings on CT, ischemic colitis to be in the differential but seems much less likely.  Discussed with neurosurgery who agreed.  Added on i-STAT lactic acid which is not positive.  This is more consistent with likely a infectious pathology.  Started on Zosyn due to severity of symptoms blood cultures drawn patient arranged for admission for further care management. Disposition:   Based on the above findings, I believe this patient is stable for admission.    Patient/family educated about specific findings on our evaluation and explained exact reasons for  admission.  Patient/family educated about clinical situation and time was allowed to answer questions.   Admission team communicated with and agreed with need for admission. Patient admitted. Patient ready to move at this time.     Emergency Department Medication Summary:   Medications  morphine (PF) 2 MG/ML injection 2 mg (2 mg Intravenous Given 02/09/23 1828)  ondansetron (ZOFRAN) injection 4 mg (4 mg Intravenous Given 02/09/23 1828)  anastrozole (ARIMIDEX) tablet 1 mg (has no administration in time range)  rosuvastatin (CRESTOR) tablet 10 mg (has no administration in time range)  pantoprazole (PROTONIX) EC tablet 40 mg (has no administration in time range)  pregabalin (LYRICA) capsule 150 mg (has no administration in time range)  enoxaparin (LOVENOX) injection 40 mg (40 mg Subcutaneous Given 02/09/23 2105)  acetaminophen (TYLENOL) tablet 650 mg (has no administration in time range)    Or  acetaminophen (TYLENOL) suppository 650 mg (has no administration in time range)  HYDROcodone-acetaminophen (NORCO/VICODIN) 5-325 MG per tablet 1-2 tablet (has no administration in time range)  lactated ringers infusion (has no administration in time range)  ondansetron (ZOFRAN-ODT) disintegrating tablet 4 mg (4 mg Oral Given 02/09/23 1118)  sodium chloride 0.9 % bolus 1,000 mL (0 mLs Intravenous Stopped 02/09/23 1557)  morphine (PF) 2 MG/ML injection 2 mg (2 mg Intravenous Given 02/09/23 1333)  iohexol (OMNIPAQUE) 300 MG/ML solution 100 mL (100 mLs Intravenous Contrast Given 02/09/23 1500)  piperacillin-tazobactam (ZOSYN) IVPB 3.375 g (3.375 g Intravenous New Bag/Given  02/09/23 1745)             Clinical Impression:  1. Generalized abdominal pain      Admit   Final Clinical Impression(s) / ED Diagnoses Final diagnoses:  Generalized abdominal pain    Rx / DC Orders ED Discharge Orders     None         Glyn Ade, MD 02/09/23 2258

## 2023-02-09 NOTE — H&P (Signed)
History and Physical    KIANDRA JOEL ZOX:[id number] DOB: [birth year] DOA: 02/09/2023  PCP: Geoffry Paradise, MD   Chief Complaint: abd pain  HPI: Candice Brown is a 89 y.o. female with medical history significant of hyperlipidemia, GERD, depression who presents emergency department due to abdominal pain.  Patient had nausea vomiting and lower quadrant abdominal pain for last 24 hours.  She has had episodes of diverticulitis before however states this felt different.  She also has a history of prior abdominal surgeries including hysterectomy, appendectomy and cholecystectomy.  She presented to the ER where she was found to be afebrile dynamically stable.  Labs were obtained on presentation which demonstrated lipase 88, CMP unrevealing, WBC 13.1, hemoglobin 14.2, urinalysis negative for infection, lactic acid within normal limits.  Patient underwent CT abdomen pelvis which demonstrated concern for enteritis.  Patient was given Zosyn admitted further workup.  On admission she was resting comfortably.  She was endorsing nausea vomiting and some diarrhea.  Antibiotics were discontinued due to likely viral etiology. She states that symptoms have improved since presentation to the hospital.    Review of Systems: Review of Systems  All other systems reviewed and are negative.    As per HPI otherwise 10 point review of systems negative.   Allergies  Allergen Reactions   Linzess [Linaclotide] Diarrhea    Increased leg cramps   Oxycodone Nausea Only     ( made very sick)   Simvastatin Other (See Comments)    Leg cramps   Benzalkonium Chloride Rash    In neosporin   Neosporin [Neomycin-Polymyxin-Gramicidin] Rash   Tape Rash    Adhesive patches of any kind    Past Medical History:  Diagnosis Date   Anemia    Arthritis    Colon polyps    hyperplastic   Complication of anesthesia    hard to wake up   Depression    Diverticulosis    GERD (gastroesophageal reflux disease)    Hiatal hernia     Hyperlipidemia    IBS (irritable bowel syndrome)    Internal hemorrhoids    Lung nodule    Occipital neuralgia    Peripheral neuropathy     Past Surgical History:  Procedure Laterality Date   ABDOMINAL HYSTERECTOMY  1972   APPENDECTOMY  1959   BREAST LUMPECTOMY WITH RADIOACTIVE SEED LOCALIZATION Right 07/18/2021   Procedure: RIGHT BREAST LUMPECTOMY WITH RADIOACTIVE SEED LOCALIZATION;  Surgeon: Almond Lint, MD;  Location: [facility];  Service: General;  Laterality: Right;   CATARACT EXTRACTION Bilateral    left 03/2014, right eye 06/2016   CHOLECYSTECTOMY       reports that she has been smoking cigarettes. She has never used smokeless tobacco. She reports current alcohol use. She reports that she does not use drugs.  Family History  Problem Relation Age of Onset   Breast cancer Mother 35   Heart disease Father    CVA Father    Heart attack Father 62   Heart attack Paternal Grandfather    Colon cancer Neg Hx    Stomach cancer Neg Hx    Rectal cancer Neg Hx    Esophageal cancer Neg Hx     Prior to Admission medications   Medication Sig Start Date End Date Taking? Authorizing Provider  anastrozole (ARIMIDEX) 1 MG tablet Take 1 tablet (1 mg total) by mouth daily. 08/17/22   Rachel Moulds, MD  diphenhydramine-acetaminophen (TYLENOL PM) 25-500 MG TABS tablet Take 2 tablets by mouth  at bedtime as needed (Sleep).    [provider]  Methylcellulose, Laxative, (CITRUCEL PO) Take 2 tablets by mouth daily.    [provider]  Multiple Vitamin (MULTI VITAMIN) TABS Take 1 tablet by mouth daily. Senior woman    [provider]  nicotine polacrilex (NICORETTE) 4 MG gum Take 4 mg by mouth as needed for smoking cessation.    [provider]  NON FORMULARY Take 2 tablets by mouth at bedtime. [place] leg cramp pills    [provider]  OVER THE COUNTER MEDICATION Take 2 tablets by mouth daily as needed (Back pain). Back and body  aspirin    [provider]  pantoprazole (PROTONIX) 40 MG tablet Take 40 mg by mouth daily.     [provider]  polycarbophil (FIBERCON) 625 MG tablet Take 1,250 mg by mouth at bedtime.    [provider]  Polyvinyl Alcohol-Povidone (REFRESH OP) Place 1 drop into both eyes daily as needed (Dry eye).    [provider]  pregabalin (LYRICA) 150 MG capsule Take 150 mg by mouth daily.    [provider]  Probiotic Product (ALIGN PO) Take 1 tablet by mouth daily.    [provider]  rosuvastatin (CRESTOR) 10 MG tablet Take 10 mg by mouth 2 (two) times a week. 06/28/15   [provider]    Physical Exam: Vitals:   02/09/23 1108 02/09/23 1111 02/09/23 1418  BP: (!) 143/91  (!) 123/49  Pulse: 63  (!) 59  Resp: 16  16  Temp: 97.8 F (36.6 C)  97.8 F (36.6 C)  TempSrc: Oral    SpO2: 99%  93%  Weight:  53.5 kg   Height:  5\' 3"  (1.6 m)    Physical Exam Vitals reviewed.  Constitutional:      Appearance: She is normal weight.  HENT:     Head: Normocephalic.     Mouth/Throat:     Mouth: Mucous membranes are moist.  Eyes:     Extraocular Movements: Extraocular movements intact.  Cardiovascular:     Rate and Rhythm: Normal rate and regular rhythm.     Heart sounds: Normal heart sounds.  Abdominal:     General: Abdomen is flat. Bowel sounds are normal.     Palpations: Abdomen is soft.  Skin:    General: Skin is warm.     Capillary Refill: Capillary refill takes less than 2 seconds.  Neurological:     General: No focal deficit present.     Mental Status: She is alert.       Labs on Admission: I have personally reviewed the patients's labs and imaging studies.  Assessment/Plan Principal Problem:   Gastroenteritis   # Viral gastroenteritis - Patient presented abdominal pain nausea and CT findings concerning for enteritis - Prior history of diverticulitis - Leukocytosis - Status post Zosyn in emergency  department  Plan: No need to continue antibiotics Continue volume resus Check GI pathogen panel  # Breast cancer-continue anastrozole  # GERD-continue Protonix  # Neuropathy-continue gabapentin  # Hyperlipidemia-continue statin    Admission status: Observation Med-Surg  Certification: The appropriate patient status for this patient is OBSERVATION. Observation status is judged to be reasonable and necessary in order to provide the required intensity of service to ensure the patient's safety. The patient's presenting symptoms, physical exam findings, and initial radiographic and laboratory data in the context of their medical condition is felt to place them at decreased risk for further clinical  deterioration. Furthermore, it is anticipated that the patient will be medically stable for discharge from the hospital within 2 midnights of admission.     Alan Mulder MD [facility] If 7PM-7AM, please contact night-coverage [email or web address]  02/09/2023, 6:26 PM

## 2023-02-09 NOTE — ED Triage Notes (Addendum)
Pt BIB [facility] coming from [facility]. Pt woke up this AM with intermittent abd cramps with associated N/V and normal BMs. Pt locates pain to lower abd at "the bikini line." Pt with hx of diverticulitis.   EMS Vitals  140/80 SpO2 97% on R/A HR 60 RR 20

## 2023-02-10 DIAGNOSIS — E876 Hypokalemia: Secondary | ICD-10-CM | POA: Diagnosis present

## 2023-02-10 DIAGNOSIS — Z6820 Body mass index (BMI) 20.0-20.9, adult: Secondary | ICD-10-CM | POA: Diagnosis not present

## 2023-02-10 DIAGNOSIS — Z888 Allergy status to other drugs, medicaments and biological substances status: Secondary | ICD-10-CM | POA: Diagnosis not present

## 2023-02-10 DIAGNOSIS — K529 Noninfective gastroenteritis and colitis, unspecified: Secondary | ICD-10-CM

## 2023-02-10 DIAGNOSIS — Z8249 Family history of ischemic heart disease and other diseases of the circulatory system: Secondary | ICD-10-CM | POA: Diagnosis not present

## 2023-02-10 DIAGNOSIS — Z170 Estrogen receptor positive status [ER+]: Secondary | ICD-10-CM | POA: Diagnosis not present

## 2023-02-10 DIAGNOSIS — Z79811 Long term (current) use of aromatase inhibitors: Secondary | ICD-10-CM | POA: Diagnosis not present

## 2023-02-10 DIAGNOSIS — Z886 Allergy status to analgesic agent status: Secondary | ICD-10-CM | POA: Diagnosis not present

## 2023-02-10 DIAGNOSIS — C50411 Malignant neoplasm of upper-outer quadrant of right female breast: Secondary | ICD-10-CM | POA: Diagnosis present

## 2023-02-10 DIAGNOSIS — Z7982 Long term (current) use of aspirin: Secondary | ICD-10-CM | POA: Diagnosis not present

## 2023-02-10 DIAGNOSIS — Z91048 Other nonmedicinal substance allergy status: Secondary | ICD-10-CM | POA: Diagnosis not present

## 2023-02-10 DIAGNOSIS — G629 Polyneuropathy, unspecified: Secondary | ICD-10-CM | POA: Diagnosis present

## 2023-02-10 DIAGNOSIS — K219 Gastro-esophageal reflux disease without esophagitis: Secondary | ICD-10-CM | POA: Diagnosis present

## 2023-02-10 DIAGNOSIS — K565 Intestinal adhesions [bands], unspecified as to partial versus complete obstruction: Secondary | ICD-10-CM | POA: Diagnosis present

## 2023-02-10 DIAGNOSIS — E43 Unspecified severe protein-calorie malnutrition: Secondary | ICD-10-CM | POA: Diagnosis present

## 2023-02-10 DIAGNOSIS — A084 Viral intestinal infection, unspecified: Secondary | ICD-10-CM | POA: Diagnosis present

## 2023-02-10 DIAGNOSIS — F17210 Nicotine dependence, cigarettes, uncomplicated: Secondary | ICD-10-CM | POA: Diagnosis present

## 2023-02-10 DIAGNOSIS — K56609 Unspecified intestinal obstruction, unspecified as to partial versus complete obstruction: Secondary | ICD-10-CM | POA: Diagnosis not present

## 2023-02-10 DIAGNOSIS — K9189 Other postprocedural complications and disorders of digestive system: Secondary | ICD-10-CM | POA: Diagnosis not present

## 2023-02-10 DIAGNOSIS — E785 Hyperlipidemia, unspecified: Secondary | ICD-10-CM | POA: Diagnosis present

## 2023-02-10 DIAGNOSIS — Z885 Allergy status to narcotic agent status: Secondary | ICD-10-CM | POA: Diagnosis not present

## 2023-02-10 DIAGNOSIS — R109 Unspecified abdominal pain: Secondary | ICD-10-CM | POA: Diagnosis present

## 2023-02-10 DIAGNOSIS — Z90710 Acquired absence of both cervix and uterus: Secondary | ICD-10-CM | POA: Diagnosis not present

## 2023-02-10 DIAGNOSIS — E871 Hypo-osmolality and hyponatremia: Secondary | ICD-10-CM | POA: Diagnosis present

## 2023-02-10 DIAGNOSIS — K567 Ileus, unspecified: Secondary | ICD-10-CM | POA: Diagnosis not present

## 2023-02-10 DIAGNOSIS — R188 Other ascites: Secondary | ICD-10-CM | POA: Diagnosis present

## 2023-02-10 LAB — BASIC METABOLIC PANEL
Anion gap: 8 (ref 5–15)
BUN: 21 mg/dL (ref 8–23)
CO2: 26 mmol/L (ref 22–32)
Calcium: 8.9 mg/dL (ref 8.9–10.3)
Chloride: 103 mmol/L (ref 98–111)
Creatinine, Ser: 0.74 mg/dL (ref 0.44–1.00)
GFR, Estimated: >60 mL/min (ref >60)
Glucose, Bld: 133 mg/dL — ABNORMAL HIGH (ref 70–99)
Potassium: 4.3 mmol/L (ref 3.5–5.1)
Sodium: 137 mmol/L (ref 135–145)

## 2023-02-10 LAB — CBC
HCT: 42.8 % (ref 36.0–46.0)
Hemoglobin: 13.5 g/dL (ref 12.0–15.0)
MCH: 32.2 pg (ref 26.0–34.0)
MCHC: 31.5 g/dL (ref 30.0–36.0)
MCV: 102.1 fL — ABNORMAL HIGH (ref 80.0–100.0)
Platelets: 272 10*3/uL (ref 150–400)
RBC: 4.19 MIL/uL (ref 3.87–5.11)
RDW: 12.5 % (ref 11.5–15.5)
WBC: 9.9 10*3/uL (ref 4.0–10.5)
nRBC: 0 % (ref 0.0–0.2)

## 2023-02-10 MED ORDER — PROCHLORPERAZINE EDISYLATE 10 MG/2ML IJ SOLN
5.0000 mg | Freq: Four times a day (QID) | INTRAMUSCULAR | Status: AC | PRN
  Administered 2023-02-10 (×2): 5 mg via INTRAVENOUS
  Filled 2023-02-10 (×2): qty 2

## 2023-02-10 MED ORDER — MORPHINE SULFATE (PF) 2 MG/ML IV SOLN
1.0000 mg | INTRAVENOUS | Status: DC | PRN
  Administered 2023-02-10 – 2023-02-11 (×4): 1 mg via INTRAVENOUS
  Filled 2023-02-10 (×4): qty 1

## 2023-02-10 MED ORDER — PIPERACILLIN-TAZOBACTAM 3.375 G IVPB
3.3750 g | Freq: Three times a day (TID) | INTRAVENOUS | Status: DC
  Administered 2023-02-10 – 2023-02-12 (×6): 3.375 g via INTRAVENOUS
  Filled 2023-02-10 (×5): qty 50

## 2023-02-10 MED ORDER — AMOXICILLIN-POT CLAVULANATE 875-125 MG PO TABS
1.0000 | ORAL_TABLET | Freq: Two times a day (BID) | ORAL | Status: DC
Start: 2023-02-10 — End: 2023-02-10
  Administered 2023-02-10: 1 via ORAL
  Filled 2023-02-10: qty 1

## 2023-02-10 NOTE — Plan of Care (Signed)
°  Problem: Clinical Measurements: Goal: Respiratory complications will improve Outcome: Progressing Goal: Cardiovascular complication will be avoided Outcome: Progressing   Problem: Activity: Goal: Risk for activity intolerance will decrease Outcome: Progressing   Problem: Elimination: Goal: Will not experience complications related to bowel motility Outcome: Progressing Goal: Will not experience complications related to urinary retention Outcome: Progressing   Problem: Safety: Goal: Ability to remain free from injury will improve Outcome: Progressing   Problem: Skin Integrity: Goal: Risk for impaired skin integrity will decrease Outcome: Progressing   Problem: Nutrition: Goal: Adequate nutrition will be maintained Outcome: Not Progressing   Problem: Coping: Goal: Level of anxiety will decrease Outcome: Not Progressing   Problem: Pain Management: Goal: General experience of comfort will improve Outcome: Not Progressing

## 2023-02-10 NOTE — Progress Notes (Signed)
PROGRESS NOTE    Candice Brown  GEX:[id number] DOB: October 11, 1931 DOA: 02/09/2023 PCP: Geoffry Paradise, MD     Brief Narrative:  Candice Brown is a 89 y.o. female with medical history significant of hyperlipidemia, GERD, depression who presents emergency department due to abdominal pain.  Patient had nausea vomiting and lower quadrant abdominal pain for last 24 hours.  She has had episodes of diverticulitis before however states this felt different.  She also has a history of prior abdominal surgeries including hysterectomy, appendectomy and cholecystectomy.  Patient underwent CT abdomen pelvis which demonstrated concern for short segment of abnormal small bowel in the right lower quadrant with regional inflammatory/edematous changes in the mesentery and small volume ascites. Appearance is nonspecific, considerations include localized infectious, inflammatory, or ischemic enteritis.  Patient was started on IV Zosyn.  New events last 24 hours / Subjective: Patient continues to have nausea and abdominal pain.  Her abdominal pain was in the lower quadrants on admission, has now moved to epigastric.  Her nausea worsened this morning after taking a couple bites of Jell-O and crackers.  Has not had a bowel movement since Friday.  No sick contacts.  Assessment & Plan:   Principal Problem:   Enteritis Active Problems:   Malignant neoplasm of upper-outer quadrant of right breast in female, estrogen receptor positive (HCC)   HLD (hyperlipidemia)   GERD (gastroesophageal reflux disease)   Enteritis -CT abdomen pelvis: Short segment of abnormal small bowel in the right lower quadrant with regional inflammatory/edematous changes in the mesentery and small volume ascites. Appearance is nonspecific, considerations include localized infectious, inflammatory, or ischemic enteritis. -If no improvement in the next 24 to 48 hours, consider CTA abdomen pelvis -Continue Zosyn, IV fluid -WBC with  improvement -Full liquid diet today  Hyperlipidemia -Crestor  History of breast cancer -Anastrozole  GERD -Protonix   DVT prophylaxis:  enoxaparin (LOVENOX) injection 40 mg Start: 02/09/23 2200 SCDs Start: 02/09/23 1825  Code Status: Full code Family Communication: Daughter at bedside Disposition Plan: Home Status is: Observation The patient will require care spanning > 2 midnights and should be moved to inpatient because: IV antibiotics, IV fluid    Antimicrobials:  Anti-infectives (From admission, onward)    Start     Dose/Rate Route Frequency Ordered Stop   02/10/23 1400  piperacillin-tazobactam (ZOSYN) IVPB 3.375 g        3.375 g 12.5 mL/hr over 240 Minutes Intravenous Every 8 hours 02/10/23 1205     02/10/23 1000  amoxicillin-clavulanate (AUGMENTIN) 875-125 MG per tablet 1 tablet  Status:  Discontinued        1 tablet Oral Every 12 hours 02/10/23 0809 02/10/23 1205   02/09/23 1700  piperacillin-tazobactam (ZOSYN) IVPB 3.375 g        3.375 g 12.5 mL/hr over 240 Minutes Intravenous  Once 02/09/23 1652 02/09/23 2145        Objective: Vitals:   02/09/23 2015 02/10/23 0005 02/10/23 0409 02/10/23 0840  BP: (!) 143/55 138/61 126/61 130/70  Pulse: 66 77 68   Resp: 15 18 15    Temp: 98.8 F (37.1 C) 98.4 F (36.9 C) 97.9 F (36.6 C) 98 F (36.7 C)  TempSrc: Oral Oral    SpO2: 92% 91% 98% 100%  Weight:      Height:        Intake/Output Summary (Last 24 hours) at 02/10/2023 1243 Last data filed at 02/10/2023 0900 Gross per 24 hour  Intake 240 ml  Output --  Net 240  ml   Filed Weights   02/09/23 1111  Weight: 53.5 kg    Examination:  General exam: Appears calm and comfortable  Respiratory system: Clear to auscultation. Respiratory effort normal. No respiratory distress. No conversational dyspnea.  Cardiovascular system: S1 & S2 heard, RRR. No murmurs. No pedal edema. Gastrointestinal system: Abdomen is nondistended, soft and tender to palpation  epigastrium Central nervous system: Alert and oriented. No focal neurological deficits. Speech clear.  Extremities: Symmetric in appearance  Skin: No rashes, lesions or ulcers on exposed skin  Psychiatry: Judgement and insight appear normal. Mood & affect appropriate.   Data Reviewed: I have personally reviewed following labs and imaging studies  CBC: Recent Labs  Lab 02/09/23 1135 02/10/23 0507  WBC 13.1* 9.9  HGB 14.2 13.5  HCT 42.2 42.8  MCV 100.5* 102.1*  PLT 279 272   Basic Metabolic Panel: Recent Labs  Lab 02/09/23 1135 02/10/23 0507  NA 138 137  K 4.0 4.3  CL 103 103  CO2 25 26  GLUCOSE 121* 133*  BUN 26* 21  CREATININE 0.83 0.74  CALCIUM 9.8 8.9   GFR: Estimated Creatinine Clearance: 39.4 mL/min (by C-G formula based on SCr of 0.74 mg/dL). Liver Function Tests: Recent Labs  Lab 02/09/23 1135  AST 29  ALT 18  ALKPHOS 45  BILITOT 0.7  PROT 7.3  ALBUMIN 4.2   Recent Labs  Lab 02/09/23 1135  LIPASE 88*   No results for input(s): "AMMONIA" in the last 168 hours. Coagulation Profile: No results for input(s): "INR", "PROTIME" in the last 168 hours. Cardiac Enzymes: No results for input(s): "CKTOTAL", "CKMB", "CKMBINDEX", "TROPONINI" in the last 168 hours. BNP (last 3 results) No results for input(s): "PROBNP" in the last 8760 hours. HbA1C: No results for input(s): "HGBA1C" in the last 72 hours. CBG: No results for input(s): "GLUCAP" in the last 168 hours. Lipid Profile: No results for input(s): "CHOL", "HDL", "LDLCALC", "TRIG", "CHOLHDL", "LDLDIRECT" in the last 72 hours. Thyroid Function Tests: No results for input(s): "TSH", "T4TOTAL", "FREET4", "T3FREE", "THYROIDAB" in the last 72 hours. Anemia Panel: No results for input(s): "VITAMINB12", "FOLATE", "FERRITIN", "TIBC", "IRON", "RETICCTPCT" in the last 72 hours. Sepsis Labs: Recent Labs  Lab 02/09/23 1648 02/09/23 1956  LATICACIDVEN 0.8 0.9    Recent Results (from the past 240 hours)   Blood culture (routine x 2)     Status: None (Preliminary result)   Collection Time: 02/09/23  5:30 PM   Specimen: BLOOD  Result Value Ref Range Status   Specimen Description   Final    BLOOD LEFT ANTECUBITAL Performed at [facility], [address], [place], [place] [address]    Special Requests   Final    BOTTLES DRAWN AEROBIC AND ANAEROBIC Blood Culture adequate volume Performed at [facility], [address] [place], [place] [address]    Culture   Final    NO GROWTH < 12 HOURS Performed at [facility], [address] [place], [place] [address]    Report Status PENDING  Incomplete  Blood culture (routine x 2)     Status: None (Preliminary result)   Collection Time: 02/09/23  5:45 PM   Specimen: BLOOD  Result Value Ref Range Status   Specimen Description   Final    BLOOD LEFT ANTECUBITAL Performed at [facility], [address] [place], [place] [address]    Special Requests   Final    BOTTLES DRAWN AEROBIC AND ANAEROBIC Blood Culture  results may not be optimal due to an inadequate volume of blood received in culture bottles Performed at [facility], [address] [place], [place] [address]    Culture   Final    NO GROWTH < 12 HOURS Performed at [facility], [address], [place], [place] [address]    Report Status PENDING  Incomplete  MRSA Next Gen by PCR, Nasal     Status: None   Collection Time: 02/09/23  9:06 PM   Specimen: Nasal Mucosa; Nasal Swab  Result Value Ref Range Status   MRSA by PCR Next Gen NOT DETECTED NOT DETECTED Final    Comment: (NOTE) The GeneXpert MRSA Assay (FDA approved for NASAL specimens only), is one component of a comprehensive MRSA colonization surveillance program. It is not intended to diagnose MRSA infection nor to guide or monitor treatment for MRSA infections. Test performance is not FDA approved in patients less than 51  years old. Performed at [facility], [address] [place], [place] [address]       Radiology Studies: CT ABDOMEN PELVIS W CONTRAST Result Date: 02/09/2023 CLINICAL DATA:  Acute abdominal pain, nonlocalized EXAM: CT ABDOMEN AND PELVIS WITH CONTRAST TECHNIQUE: Multidetector CT imaging of the abdomen and pelvis was performed using the standard protocol following bolus administration of intravenous contrast. RADIATION DOSE REDUCTION: This exam was performed according to the departmental dose-optimization program which includes automated exposure control, adjustment of the mA and/or kV according to patient size and/or use of iterative reconstruction technique. CONTRAST:  OMNIPAQUE IOHEXOL 300 MG/ML  SOLN COMPARISON:  CT 02/10/2020 FINDINGS: Lower chest: No pleural or pericardial effusion. Scattered coronary and aortic calcifications. Coarse subpleural linear scarring or atelectasis in cystic change posteriorly in the lung bases. Hepatobiliary: No focal liver abnormality is seen. Status post cholecystectomy. No biliary dilatation. Pancreas: Unremarkable. No pancreatic ductal dilatation or surrounding inflammatory changes. Spleen: Normal in size without focal abnormality. Adrenals/Urinary Tract: No adrenal mass. Symmetric renal enhancement without urolithiasis or hydronephrosis. Multiple cortical lesions in both kidneys, some of which can be characterized as cysts, largest 1.1 cm 21 HU left upper pole; no followup recommended. Urinary bladder is distended. Stomach/Bowel: Stomach is partially distended, without acute finding. Fluid distended small bowel loops in the left lower abdomen. Transition zone in the right lower quadrant with a short loop of small bowel showing some circumferential wall thickening, and relatively decreased mucosal enhancement, with regional inflammatory/edematous changes in the mesentery (Im26-29,Se82) . post appendectomy. The colon is partially distended,  with multiple descending and sigmoid diverticula; no adjacent inflammatory change. Vascular/Lymphatic: Heavy calcified aortoiliac atheromatous plaque without aneurysm. Portal vein patent. No abdominal or pelvic adenopathy. Reproductive: Status post hysterectomy. No adnexal masses. Other: Small amount of free fluid in the pelvis. Small volume right lower quadrant ascites adjacent to the abnormal loop of bowel as above. No upper abdominal ascites. No free air. Musculoskeletal: Thoracolumbar lower levoscoliosis apex L2 with multilevel lumbar spondylitic change. Grade 1 anterolisthesis L5-S1 attributed to advanced facet DJD. IMPRESSION: 1. Short segment of abnormal small bowel in the right lower quadrant with regional inflammatory/edematous changes in the mesentery and small volume ascites. Appearance is nonspecific, considerations include localized infectious, inflammatory, or ischemic enteritis. 2. Descending and sigmoid diverticulosis. 3.  Aortic Atherosclerosis (ICD10-I70.0). Electronically Signed   By: Corlis Leak M.D.   On: 02/09/2023 16:24      Scheduled Meds:  anastrozole  1 mg Oral Daily   enoxaparin (LOVENOX) injection  40 mg  Subcutaneous Q24H   pantoprazole  40 mg Oral Daily   pregabalin  150 mg Oral Daily   [START ON 02/11/2023] rosuvastatin  10 mg Oral Once per day on Monday Thursday   Continuous Infusions:  lactated ringers 100 mL/hr at 02/10/23 1237   piperacillin-tazobactam (ZOSYN)  IV       LOS: 0 days   Time spent: 30 minutes   Noralee Stain, DO [facility] 02/10/2023, 12:43 PM   Available via Epic secure chat 7am-7pm After these hours, please refer to coverage provider listed on [email or web address]

## 2023-02-10 NOTE — Plan of Care (Signed)

## 2023-02-11 DIAGNOSIS — K529 Noninfective gastroenteritis and colitis, unspecified: Secondary | ICD-10-CM | POA: Diagnosis not present

## 2023-02-11 LAB — BASIC METABOLIC PANEL
Anion gap: 7 (ref 5–15)
BUN: 22 mg/dL (ref 8–23)
CO2: 27 mmol/L (ref 22–32)
Calcium: 8.8 mg/dL — ABNORMAL LOW (ref 8.9–10.3)
Chloride: 100 mmol/L (ref 98–111)
Creatinine, Ser: 1.05 mg/dL — ABNORMAL HIGH (ref 0.44–1.00)
GFR, Estimated: 51 mL/min — ABNORMAL LOW (ref >60)
Glucose, Bld: 130 mg/dL — ABNORMAL HIGH (ref 70–99)
Potassium: 4.7 mmol/L (ref 3.5–5.1)
Sodium: 134 mmol/L — ABNORMAL LOW (ref 135–145)

## 2023-02-11 LAB — CBC
HCT: 41.7 % (ref 36.0–46.0)
Hemoglobin: 13.4 g/dL (ref 12.0–15.0)
MCH: 33.1 pg (ref 26.0–34.0)
MCHC: 32.1 g/dL (ref 30.0–36.0)
MCV: 103 fL — ABNORMAL HIGH (ref 80.0–100.0)
Platelets: 280 10*3/uL (ref 150–400)
RBC: 4.05 MIL/uL (ref 3.87–5.11)
RDW: 12.3 % (ref 11.5–15.5)
WBC: 5.4 10*3/uL (ref 4.0–10.5)
nRBC: 0 % (ref 0.0–0.2)

## 2023-02-11 MED ORDER — ENSURE ENLIVE PO LIQD
237.0000 mL | Freq: Two times a day (BID) | ORAL | Status: DC
  Administered 2023-02-11 – 2023-02-13 (×5): 237 mL via ORAL

## 2023-02-11 MED ORDER — ENOXAPARIN SODIUM 30 MG/0.3ML IJ SOSY
30.0000 mg | PREFILLED_SYRINGE | INTRAMUSCULAR | Status: DC
  Administered 2023-02-11: 30 mg via SUBCUTANEOUS
  Filled 2023-02-11: qty 0.3

## 2023-02-11 MED ORDER — PREGABALIN 75 MG PO CAPS
75.0000 mg | ORAL_CAPSULE | Freq: Every day | ORAL | Status: DC
  Administered 2023-02-11 – 2023-02-25 (×9): 75 mg via ORAL
  Filled 2023-02-11 (×12): qty 1

## 2023-02-11 NOTE — Progress Notes (Addendum)
PROGRESS NOTE    Candice Brown  BJY:[id number] DOB: 01/16/1932 DOA: 02/09/2023 PCP: Geoffry Paradise, MD     Brief Narrative:  Candice Brown is a 89 y.o. female with medical history significant of hyperlipidemia, GERD, depression who presents emergency department due to abdominal pain.  Patient had nausea vomiting and lower quadrant abdominal pain for last 24 hours.  She has had episodes of diverticulitis before however states this felt different.  She also has a history of prior abdominal surgeries including hysterectomy, appendectomy and cholecystectomy.  Patient underwent CT abdomen pelvis which demonstrated concern for short segment of abnormal small bowel in the right lower quadrant with regional inflammatory/edematous changes in the mesentery and small volume ascites. Appearance is nonspecific, considerations include localized infectious, inflammatory, or ischemic enteritis.  Patient was started on IV Zosyn.  New events last 24 hours / Subjective: Patient still having some nausea, poor PO intake with a full liquid diet.  Ambulating well.  Abdominal pain feels like "labor contraction pains".  She does admit to having an appetite, tried to order pot roast yesterday.  Pain is not worse.  No bowel movement since Friday.  No fevers.  Assessment & Plan:   Principal Problem:   Enteritis Active Problems:   Malignant neoplasm of upper-outer quadrant of right breast in female, estrogen receptor positive (HCC)   HLD (hyperlipidemia)   GERD (gastroesophageal reflux disease)   Gastroenteritis   Enteritis -CT abdomen pelvis: Short segment of abnormal small bowel in the right lower quadrant with regional inflammatory/edematous changes in the mesentery and small volume ascites. Appearance is nonspecific, considerations include localized infectious, inflammatory, or ischemic enteritis. -If no improvement in the next 24 to 48 hours, consider CTA abdomen pelvis -Continue Zosyn -Leukocytosis  resolved -Advance diet today  Hyperlipidemia -Crestor  History of breast cancer -Anastrozole  GERD -Protonix   DVT prophylaxis:  enoxaparin (LOVENOX) injection 30 mg Start: 02/11/23 2200 SCDs Start: 02/09/23 1825  Code Status: Full code Family Communication: Daughter at bedside Disposition Plan: Home Status is: Inpatient Remains inpatient appropriate because: IV antibiotics       Antimicrobials:  Anti-infectives (From admission, onward)    Start     Dose/Rate Route Frequency Ordered Stop   02/10/23 1400  piperacillin-tazobactam (ZOSYN) IVPB 3.375 g        3.375 g 12.5 mL/hr over 240 Minutes Intravenous Every 8 hours 02/10/23 1205     02/10/23 1000  amoxicillin-clavulanate (AUGMENTIN) 875-125 MG per tablet 1 tablet  Status:  Discontinued        1 tablet Oral Every 12 hours 02/10/23 0809 02/10/23 1205   02/09/23 1700  piperacillin-tazobactam (ZOSYN) IVPB 3.375 g        3.375 g 12.5 mL/hr over 240 Minutes Intravenous  Once 02/09/23 1652 02/09/23 2145        Objective: Vitals:   02/10/23 1327 02/10/23 1328 02/10/23 1945 02/11/23 0524  BP: (!) 116/54  121/65 129/61  Pulse: 65  67 68  Resp: 18  17   Temp:   98.3 F (36.8 C) 98.3 F (36.8 C)  TempSrc:    Oral  SpO2: (!) 88% 92% 92% 95%  Weight:      Height:        Intake/Output Summary (Last 24 hours) at 02/11/2023 1240 Last data filed at 02/10/2023 1700 Gross per 24 hour  Intake 340 ml  Output --  Net 340 ml   Filed Weights   02/09/23 1111  Weight: 53.5 kg    Examination:  General exam: Appears calm and comfortable  Respiratory system: Clear to auscultation. Respiratory effort normal. No respiratory distress. No conversational dyspnea.  Cardiovascular system: S1 & S2 heard, RRR. No murmurs. No pedal edema. Gastrointestinal system: Abdomen is nondistended, soft and tender to palpation periumbilical  Central nervous system: Alert and oriented. No focal neurological deficits. Speech clear.   Extremities: Symmetric in appearance  Skin: No rashes, lesions or ulcers on exposed skin  Psychiatry: Judgement and insight appear normal. Mood & affect appropriate.   Data Reviewed: I have personally reviewed following labs and imaging studies  CBC: Recent Labs  Lab 02/09/23 1135 02/10/23 0507 02/11/23 0503  WBC 13.1* 9.9 5.4  HGB 14.2 13.5 13.4  HCT 42.2 42.8 41.7  MCV 100.5* 102.1* 103.0*  PLT 279 272 280   Basic Metabolic Panel: Recent Labs  Lab 02/09/23 1135 02/10/23 0507 02/11/23 0503  NA 138 137 134*  K 4.0 4.3 4.7  CL 103 103 100  CO2 25 26 27   GLUCOSE 121* 133* 130*  BUN 26* 21 22  CREATININE 0.83 0.74 1.05*  CALCIUM 9.8 8.9 8.8*   GFR: Estimated Creatinine Clearance: 30 mL/min (A) (by C-G formula based on SCr of 1.05 mg/dL (H)). Liver Function Tests: Recent Labs  Lab 02/09/23 1135  AST 29  ALT 18  ALKPHOS 45  BILITOT 0.7  PROT 7.3  ALBUMIN 4.2   Recent Labs  Lab 02/09/23 1135  LIPASE 88*   No results for input(s): "AMMONIA" in the last 168 hours. Coagulation Profile: No results for input(s): "INR", "PROTIME" in the last 168 hours. Cardiac Enzymes: No results for input(s): "CKTOTAL", "CKMB", "CKMBINDEX", "TROPONINI" in the last 168 hours. BNP (last 3 results) No results for input(s): "PROBNP" in the last 8760 hours. HbA1C: No results for input(s): "HGBA1C" in the last 72 hours. CBG: No results for input(s): "GLUCAP" in the last 168 hours. Lipid Profile: No results for input(s): "CHOL", "HDL", "LDLCALC", "TRIG", "CHOLHDL", "LDLDIRECT" in the last 72 hours. Thyroid Function Tests: No results for input(s): "TSH", "T4TOTAL", "FREET4", "T3FREE", "THYROIDAB" in the last 72 hours. Anemia Panel: No results for input(s): "VITAMINB12", "FOLATE", "FERRITIN", "TIBC", "IRON", "RETICCTPCT" in the last 72 hours. Sepsis Labs: Recent Labs  Lab 02/09/23 1648 02/09/23 1956  LATICACIDVEN 0.8 0.9    Recent Results (from the past 240 hours)  Blood  culture (routine x 2)     Status: None (Preliminary result)   Collection Time: 02/09/23  5:30 PM   Specimen: BLOOD  Result Value Ref Range Status   Specimen Description   Final    BLOOD LEFT ANTECUBITAL Performed at [facility], [address], [place], [place] [address]    Special Requests   Final    BOTTLES DRAWN AEROBIC AND ANAEROBIC Blood Culture adequate volume Performed at [facility], [address], [place], [place] [address]    Culture   Final    NO GROWTH < 12 HOURS Performed at [facility], [address] [place], [place] [address]    Report Status PENDING  Incomplete  Blood culture (routine x 2)     Status: None (Preliminary result)   Collection Time: 02/09/23  5:45 PM   Specimen: BLOOD  Result Value Ref Range Status   Specimen Description   Final    BLOOD LEFT ANTECUBITAL Performed at [facility], [address] [place], [place] [address]    Special Requests   Final    BOTTLES DRAWN AEROBIC AND ANAEROBIC  Blood Culture results may not be optimal due to an inadequate volume of blood received in culture bottles Performed at [facility], [address] [place], [place] [address]    Culture   Final    NO GROWTH < 12 HOURS Performed at [facility], [address] [place], [place] [address]    Report Status PENDING  Incomplete  MRSA Next Gen by PCR, Nasal     Status: None   Collection Time: 02/09/23  9:06 PM   Specimen: Nasal Mucosa; Nasal Swab  Result Value Ref Range Status   MRSA by PCR Next Gen NOT DETECTED NOT DETECTED Final    Comment: (NOTE) The GeneXpert MRSA Assay (FDA approved for NASAL specimens only), is one component of a comprehensive MRSA colonization surveillance program. It is not intended to diagnose MRSA infection nor to guide or monitor treatment for MRSA infections. Test performance is not FDA approved in patients less than 76 years old. Performed at  [facility], [address], [place], [place] [address]       Radiology Studies: CT ABDOMEN PELVIS W CONTRAST Result Date: 02/09/2023 CLINICAL DATA:  Acute abdominal pain, nonlocalized EXAM: CT ABDOMEN AND PELVIS WITH CONTRAST TECHNIQUE: Multidetector CT imaging of the abdomen and pelvis was performed using the standard protocol following bolus administration of intravenous contrast. RADIATION DOSE REDUCTION: This exam was performed according to the departmental dose-optimization program which includes automated exposure control, adjustment of the mA and/or kV according to patient size and/or use of iterative reconstruction technique. CONTRAST:  OMNIPAQUE IOHEXOL 300 MG/ML  SOLN COMPARISON:  CT 02/10/2020 FINDINGS: Lower chest: No pleural or pericardial effusion. Scattered coronary and aortic calcifications. Coarse subpleural linear scarring or atelectasis in cystic change posteriorly in the lung bases. Hepatobiliary: No focal liver abnormality is seen. Status post cholecystectomy. No biliary dilatation. Pancreas: Unremarkable. No pancreatic ductal dilatation or surrounding inflammatory changes. Spleen: Normal in size without focal abnormality. Adrenals/Urinary Tract: No adrenal mass. Symmetric renal enhancement without urolithiasis or hydronephrosis. Multiple cortical lesions in both kidneys, some of which can be characterized as cysts, largest 1.1 cm 21 HU left upper pole; no followup recommended. Urinary bladder is distended. Stomach/Bowel: Stomach is partially distended, without acute finding. Fluid distended small bowel loops in the left lower abdomen. Transition zone in the right lower quadrant with a short loop of small bowel showing some circumferential wall thickening, and relatively decreased mucosal enhancement, with regional inflammatory/edematous changes in the mesentery ([id number],Se82) . post appendectomy. The colon is partially distended, with multiple descending and  sigmoid diverticula; no adjacent inflammatory change. Vascular/Lymphatic: Heavy calcified aortoiliac atheromatous plaque without aneurysm. Portal vein patent. No abdominal or pelvic adenopathy. Reproductive: Status post hysterectomy. No adnexal masses. Other: Small amount of free fluid in the pelvis. Small volume right lower quadrant ascites adjacent to the abnormal loop of bowel as above. No upper abdominal ascites. No free air. Musculoskeletal: Thoracolumbar lower levoscoliosis apex L2 with multilevel lumbar spondylitic change. Grade 1 anterolisthesis L5-S1 attributed to advanced facet DJD. IMPRESSION: 1. Short segment of abnormal small bowel in the right lower quadrant with regional inflammatory/edematous changes in the mesentery and small volume ascites. Appearance is nonspecific, considerations include localized infectious, inflammatory, or ischemic enteritis. 2. Descending and sigmoid diverticulosis. 3.  Aortic Atherosclerosis (ICD10-I70.0). Electronically Signed   By: Corlis Leak M.D.   On: 02/09/2023 16:24      Scheduled Meds:  anastrozole  1 mg Oral Daily   enoxaparin (LOVENOX) injection  30 mg Subcutaneous Q24H   feeding supplement  237 mL Oral BID BM   pantoprazole  40 mg Oral Daily   pregabalin  75 mg Oral Daily   rosuvastatin  10 mg Oral Once per day on Monday Thursday   Continuous Infusions:  piperacillin-tazobactam (ZOSYN)  IV 3.375 g (02/11/23 0530)     LOS: 1 day   Time spent: 25 minutes   Noralee Stain, DO [facility] 02/11/2023, 12:40 PM   Available via Epic secure chat 7am-7pm After these hours, please refer to coverage provider listed on [email or web address]

## 2023-02-11 NOTE — Plan of Care (Signed)

## 2023-02-11 NOTE — Progress Notes (Signed)
Mobility Specialist - Progress Note   02/11/23 1046  Mobility  Activity Ambulated with assistance in hallway  Level of Assistance Standby assist, set-up cues, supervision of patient - no hands on  Assistive Device Front wheel walker  Distance Ambulated (ft) 160 ft  Activity Response Tolerated well  Mobility Referral Yes  Mobility visit 1 Mobility  Mobility Specialist Start Time (ACUTE ONLY) 1034  Mobility Specialist Stop Time (ACUTE ONLY) 1045  Mobility Specialist Time Calculation (min) (ACUTE ONLY) 11 min   Pt received in bed and agreeable to mobility. Ambulation cut short d/t nausea. No other complaints during session. Pt to recliner after session with all needs met. MD in room.   [facility]

## 2023-02-12 DIAGNOSIS — K529 Noninfective gastroenteritis and colitis, unspecified: Secondary | ICD-10-CM | POA: Diagnosis not present

## 2023-02-12 LAB — CBC
HCT: 42.9 % (ref 36.0–46.0)
Hemoglobin: 14 g/dL (ref 12.0–15.0)
MCH: 32.6 pg (ref 26.0–34.0)
MCHC: 32.6 g/dL (ref 30.0–36.0)
MCV: 100 fL (ref 80.0–100.0)
Platelets: 315 10*3/uL (ref 150–400)
RBC: 4.29 MIL/uL (ref 3.87–5.11)
RDW: 11.9 % (ref 11.5–15.5)
WBC: 8.1 10*3/uL (ref 4.0–10.5)
nRBC: 0 % (ref 0.0–0.2)

## 2023-02-12 LAB — BASIC METABOLIC PANEL
Anion gap: 12 (ref 5–15)
BUN: 21 mg/dL (ref 8–23)
CO2: 24 mmol/L (ref 22–32)
Calcium: 8.7 mg/dL — ABNORMAL LOW (ref 8.9–10.3)
Chloride: 93 mmol/L — ABNORMAL LOW (ref 98–111)
Creatinine, Ser: 0.89 mg/dL (ref 0.44–1.00)
GFR, Estimated: >60 mL/min (ref >60)
Glucose, Bld: 144 mg/dL — ABNORMAL HIGH (ref 70–99)
Potassium: 3.5 mmol/L (ref 3.5–5.1)
Sodium: 129 mmol/L — ABNORMAL LOW (ref 135–145)

## 2023-02-12 MED ORDER — AMOXICILLIN-POT CLAVULANATE 875-125 MG PO TABS
1.0000 | ORAL_TABLET | Freq: Two times a day (BID) | ORAL | Status: DC
  Administered 2023-02-12 (×2): 1 via ORAL
  Filled 2023-02-12 (×2): qty 1

## 2023-02-12 MED ORDER — POLYETHYLENE GLYCOL 3350 17 G PO PACK
17.0000 g | PACK | Freq: Every day | ORAL | Status: DC
  Administered 2023-02-12 – 2023-02-13 (×2): 17 g via ORAL
  Filled 2023-02-12 (×2): qty 1

## 2023-02-12 MED ORDER — CALCIUM CARBONATE ANTACID 500 MG PO CHEW: 1.0000 | CHEWABLE_TABLET | Freq: Two times a day (BID) | ORAL | Status: DC | PRN

## 2023-02-12 MED ORDER — ENOXAPARIN SODIUM 40 MG/0.4ML IJ SOSY
40.0000 mg | PREFILLED_SYRINGE | INTRAMUSCULAR | Status: DC
  Administered 2023-02-12 – 2023-02-25 (×14): 40 mg via SUBCUTANEOUS
  Filled 2023-02-12 (×14): qty 0.4

## 2023-02-12 MED ORDER — CALCIUM CARBONATE ANTACID 500 MG PO CHEW
1.0000 | CHEWABLE_TABLET | Freq: Two times a day (BID) | ORAL | Status: DC | PRN
  Administered 2023-02-12: 200 mg via ORAL
  Filled 2023-02-12: qty 1

## 2023-02-12 NOTE — Plan of Care (Signed)

## 2023-02-12 NOTE — Progress Notes (Addendum)
 PROGRESS NOTE    Candice Brown  FMW:[id number] DOB: 31-Oct-1931 DOA: 02/09/2023 PCP: Shepard Ade, MD     Brief Narrative:  Candice Brown is a 89 y.o. female with medical history significant of hyperlipidemia, GERD, depression who presents emergency department due to abdominal pain.  Patient had nausea vomiting and lower quadrant abdominal pain for last 24 hours.  She has had episodes of diverticulitis before however states this felt different.  She also has a history of prior abdominal surgeries including hysterectomy, appendectomy and cholecystectomy.  Patient underwent CT abdomen pelvis which demonstrated concern for short segment of abnormal small bowel in the right lower quadrant with regional inflammatory/edematous changes in the mesentery and small volume ascites. Appearance is nonspecific, considerations include localized infectious, inflammatory, or ischemic enteritis.  Patient was started on IV Zosyn .  New events last 24 hours / Subjective: Her abdominal pain is better, but still has a little bit of tenderness to palpation.  Oral intake improved as well.  Some nausea, but overall improved.  Has not had a bowel movement.  Also complains of reflux symptoms  Addendum: This afternoon, patient had small amount of hard stool, followed by liquid green vomit. Still having same amount of abdominal tenderness to palpation. Discussed with patient, will proceed with CTA A/P today   Assessment & Plan:   Principal Problem:   Enteritis Active Problems:   Malignant neoplasm of upper-outer quadrant of right breast in female, estrogen receptor positive (HCC)   HLD (hyperlipidemia)   GERD (gastroesophageal reflux disease)   Gastroenteritis   Enteritis -CT abdomen pelvis: Short segment of abnormal small bowel in the right lower quadrant with regional inflammatory/edematous changes in the mesentery and small volume ascites. Appearance is nonspecific, considerations include localized infectious,  inflammatory, or ischemic enteritis. -Continue Zosyn  --> Augmentin   -Leukocytosis resolved -CTA A/P ordered   Hyperlipidemia -Crestor   History of breast cancer -Anastrozole   GERD -Protonix    DVT prophylaxis:  enoxaparin  (LOVENOX ) injection 40 mg Start: 02/12/23 2200 SCDs Start: 02/09/23 1825  Code Status: Full code Family Communication: Daughter at bedside Disposition Plan: Home Status is: Inpatient Remains inpatient appropriate because: Continued symptoms      Antimicrobials:  Anti-infectives (From admission, onward)    Start     Dose/Rate Route Frequency Ordered Stop   02/12/23 1230  amoxicillin -clavulanate (AUGMENTIN ) 875-125 MG per tablet 1 tablet        1 tablet Oral Every 12 hours 02/12/23 1131     02/10/23 1400  piperacillin -tazobactam (ZOSYN ) IVPB 3.375 g  Status:  Discontinued        3.375 g 12.5 mL/hr over 240 Minutes Intravenous Every 8 hours 02/10/23 1205 02/12/23 1131   02/10/23 1000  amoxicillin -clavulanate (AUGMENTIN ) 875-125 MG per tablet 1 tablet  Status:  Discontinued        1 tablet Oral Every 12 hours 02/10/23 0809 02/10/23 1205   02/09/23 1700  piperacillin -tazobactam (ZOSYN ) IVPB 3.375 g        3.375 g 12.5 mL/hr over 240 Minutes Intravenous  Once 02/09/23 1652 02/09/23 2145        Objective: Vitals:   02/11/23 0524 02/11/23 1246 02/11/23 2046 02/12/23 0500  BP: 129/61 (!) 141/63 (!) 158/69 (!) 154/70  Pulse: 68 63 71 62  Resp:  18 15   Temp: 98.3 F (36.8 C) 98 F (36.7 C) 97.9 F (36.6 C) 98 F (36.7 C)  TempSrc: Oral Oral Oral Oral  SpO2: 95% 96% 91% 92%  Weight:  Height:        Intake/Output Summary (Last 24 hours) at 02/12/2023 1521 Last data filed at 02/12/2023 0600 Gross per 24 hour  Intake 59.93 ml  Output --  Net 59.93 ml   Filed Weights   02/09/23 1111  Weight: 53.5 kg    Examination:  General exam: Appears calm and comfortable  Respiratory system: Clear to auscultation. Respiratory effort normal. No  respiratory distress. No conversational dyspnea.  Cardiovascular system: S1 & S2 heard, RRR. No murmurs. No pedal edema. Gastrointestinal system: Abdomen is nondistended, soft and tender to palpation periumbilical  Central nervous system: Alert and oriented. No focal neurological deficits. Speech clear.  Extremities: Symmetric in appearance  Skin: No rashes, lesions or ulcers on exposed skin  Psychiatry: Judgement and insight appear normal. Mood & affect appropriate.   Data Reviewed: I have personally reviewed following labs and imaging studies  CBC: Recent Labs  Lab 02/09/23 1135 02/10/23 0507 02/11/23 0503 02/12/23 0523  WBC 13.1* 9.9 5.4 8.1  HGB 14.2 13.5 13.4 14.0  HCT 42.2 42.8 41.7 42.9  MCV 100.5* 102.1* 103.0* 100.0  PLT 279 272 280 315   Basic Metabolic Panel: Recent Labs  Lab 02/09/23 1135 02/10/23 0507 02/11/23 0503 02/12/23 0523  NA 138 137 134* 129*  K 4.0 4.3 4.7 3.5  CL 103 103 100 93*  CO2 25 26 27 24   GLUCOSE 121* 133* 130* 144*  BUN 26* 21 22 21   CREATININE 0.83 0.74 1.05* 0.89  CALCIUM  9.8 8.9 8.8* 8.7*   GFR: Estimated Creatinine Clearance: 35.4 mL/min (by C-G formula based on SCr of 0.89 mg/dL). Liver Function Tests: Recent Labs  Lab 02/09/23 1135  AST 29  ALT 18  ALKPHOS 45  BILITOT 0.7  PROT 7.3  ALBUMIN  4.2   Recent Labs  Lab 02/09/23 1135  LIPASE 88*   No results for input(s): AMMONIA in the last 168 hours. Coagulation Profile: No results for input(s): INR, PROTIME in the last 168 hours. Cardiac Enzymes: No results for input(s): CKTOTAL, CKMB, CKMBINDEX, TROPONINI in the last 168 hours. BNP (last 3 results) No results for input(s): PROBNP in the last 8760 hours. HbA1C: No results for input(s): HGBA1C in the last 72 hours. CBG: No results for input(s): GLUCAP in the last 168 hours. Lipid Profile: No results for input(s): CHOL, HDL, LDLCALC, TRIG, CHOLHDL, LDLDIRECT in the last 72  hours. Thyroid  Function Tests: No results for input(s): TSH, T4TOTAL, FREET4, T3FREE, THYROIDAB in the last 72 hours. Anemia Panel: No results for input(s): VITAMINB12, FOLATE, FERRITIN, TIBC, IRON, RETICCTPCT in the last 72 hours. Sepsis Labs: Recent Labs  Lab 02/09/23 1648 02/09/23 1956  LATICACIDVEN 0.8 0.9    Recent Results (from the past 240 hours)  Blood culture (routine x 2)     Status: None (Preliminary result)   Collection Time: 02/09/23  5:30 PM   Specimen: BLOOD  Result Value Ref Range Status   Specimen Description   Final    BLOOD LEFT ANTECUBITAL Performed at [facility], [address], [place], [place] [address]    Special Requests   Final    BOTTLES DRAWN AEROBIC AND ANAEROBIC Blood Culture adequate volume Performed at [facility], [address], [place], [place] [address]    Culture   Final    NO GROWTH 3 DAYS Performed at [facility], [address], [place], [place] [address]    Report Status PENDING  Incomplete  Blood culture (routine x 2)  Status: None (Preliminary result)   Collection Time: 02/09/23  5:45 PM   Specimen: BLOOD  Result Value Ref Range Status   Specimen Description   Final    BLOOD LEFT ANTECUBITAL Performed at [facility] [address] [place], [place] [address]    Special Requests   Final    BOTTLES DRAWN AEROBIC AND ANAEROBIC Blood Culture results may not be optimal due to an inadequate volume of blood received in culture bottles Performed at [facility], [address], [place], [place] [address]    Culture   Final    NO GROWTH 3 DAYS Performed at [facility], [address] [place], [place] [address]    Report Status PENDING  Incomplete  MRSA Next Gen by PCR, Nasal     Status: None   Collection Time: 02/09/23  9:06 PM   Specimen: Nasal Mucosa; Nasal Swab  Result Value Ref Range Status   MRSA by PCR Next  Gen NOT DETECTED NOT DETECTED Final    Comment: (NOTE) The GeneXpert MRSA Assay (FDA approved for NASAL specimens only), is one component of a comprehensive MRSA colonization surveillance program. It is not intended to diagnose MRSA infection nor to guide or monitor treatment for MRSA infections. Test performance is not FDA approved in patients less than 40 years old. Performed at [facility], [address] [place], [place] [address]       Radiology Studies: No results found.     Scheduled Meds:  amoxicillin -clavulanate  1 tablet Oral Q12H   anastrozole   1 mg Oral Daily   enoxaparin  (LOVENOX ) injection  40 mg Subcutaneous Q24H   feeding supplement  237 mL Oral BID BM   pantoprazole   40 mg Oral Daily   polyethylene glycol  17 g Oral Daily   pregabalin   75 mg Oral Daily   rosuvastatin   10 mg Oral Once per day on Monday Thursday   Continuous Infusions:     LOS: 2 days   Time spent: 25 minutes   Delon Hoe, DO [facility] 02/12/2023, 3:21 PM   Available via Epic secure chat 7am-7pm After these hours, please refer to coverage provider listed on [email or web address]

## 2023-02-12 NOTE — Plan of Care (Signed)
  Problem: Education: Goal: Knowledge of General Education information will improve Description: Including pain rating scale, medication(s)/side effects and non-pharmacologic comfort measures Outcome: Progressing   Problem: Health Behavior/Discharge Planning: Goal: Ability to manage health-related needs will improve Outcome: Progressing   Problem: Clinical Measurements: Goal: Will remain free from infection Outcome: Progressing Goal: Diagnostic test results will improve Outcome: Progressing   Problem: Elimination: Goal: Will not experience complications related to bowel motility Outcome: Progressing   Problem: Pain Management: Goal: General experience of comfort will improve Outcome: Progressing

## 2023-02-13 ENCOUNTER — Inpatient Hospital Stay (HOSPITAL_COMMUNITY): Payer: Medicare Other

## 2023-02-13 DIAGNOSIS — K529 Noninfective gastroenteritis and colitis, unspecified: Secondary | ICD-10-CM | POA: Diagnosis not present

## 2023-02-13 LAB — BASIC METABOLIC PANEL
Anion gap: 9 (ref 5–15)
BUN: 22 mg/dL (ref 8–23)
CO2: 27 mmol/L (ref 22–32)
Calcium: 8.9 mg/dL (ref 8.9–10.3)
Chloride: 97 mmol/L — ABNORMAL LOW (ref 98–111)
Creatinine, Ser: 0.8 mg/dL (ref 0.44–1.00)
GFR, Estimated: >60 mL/min (ref >60)
Glucose, Bld: 98 mg/dL (ref 70–99)
Potassium: 3.4 mmol/L — ABNORMAL LOW (ref 3.5–5.1)
Sodium: 133 mmol/L — ABNORMAL LOW (ref 135–145)

## 2023-02-13 MED ORDER — POTASSIUM CHLORIDE 2 MEQ/ML IV SOLN
INTRAVENOUS | Status: AC
  Filled 2023-02-13 (×2): qty 1000

## 2023-02-13 MED ORDER — POTASSIUM CHLORIDE 10 MEQ/100ML IV SOLN
10.0000 meq | INTRAVENOUS | Status: DC
  Administered 2023-02-13: 10 meq via INTRAVENOUS
  Filled 2023-02-13 (×2): qty 100

## 2023-02-13 MED ORDER — POTASSIUM CHLORIDE CRYS ER 20 MEQ PO TBCR
40.0000 meq | EXTENDED_RELEASE_TABLET | Freq: Once | ORAL | Status: AC
  Administered 2023-02-13: 40 meq via ORAL
  Filled 2023-02-13: qty 2

## 2023-02-13 MED ORDER — LACTATED RINGERS IV SOLN: INTRAVENOUS | Status: DC

## 2023-02-13 MED ORDER — LIDOCAINE HCL URETHRAL/MUCOSAL 2 % EX GEL
1.0000 | Freq: Once | CUTANEOUS | Status: DC
  Filled 2023-02-13: qty 5

## 2023-02-13 MED ORDER — SODIUM CHLORIDE 0.9 % IV SOLN: INTRAVENOUS | Status: DC

## 2023-02-13 MED ORDER — DIATRIZOATE MEGLUMINE & SODIUM 66-10 % PO SOLN
90.0000 mL | Freq: Once | ORAL | Status: AC
  Administered 2023-02-13: 90 mL via NASOGASTRIC
  Filled 2023-02-13 (×2): qty 90

## 2023-02-13 MED ORDER — KCL-LACTATED RINGERS 20 MEQ/L IV SOLN
INTRAVENOUS | Status: DC
  Filled 2023-02-13: qty 1000

## 2023-02-13 MED ORDER — IOHEXOL 350 MG/ML SOLN
100.0000 mL | Freq: Once | INTRAVENOUS | Status: AC | PRN
  Administered 2023-02-13: 100 mL via INTRAVENOUS

## 2023-02-13 MED ORDER — PIPERACILLIN-TAZOBACTAM 3.375 G IVPB
3.3750 g | Freq: Three times a day (TID) | INTRAVENOUS | Status: DC
  Administered 2023-02-13 – 2023-02-15 (×6): 3.375 g via INTRAVENOUS
  Filled 2023-02-13 (×6): qty 50

## 2023-02-13 MED ORDER — LORAZEPAM 2 MG/ML IJ SOLN
1.0000 mg | Freq: Once | INTRAMUSCULAR | Status: AC
  Administered 2023-02-13: 1 mg via INTRAVENOUS
  Filled 2023-02-13: qty 1

## 2023-02-13 MED ORDER — DEXTROSE-SODIUM CHLORIDE 5-0.9 % IV SOLN: INTRAVENOUS | Status: DC

## 2023-02-13 MED ORDER — PANTOPRAZOLE SODIUM 40 MG IV SOLR
40.0000 mg | Freq: Two times a day (BID) | INTRAVENOUS | Status: DC
  Administered 2023-02-13 – 2023-02-22 (×20): 40 mg via INTRAVENOUS
  Filled 2023-02-13 (×20): qty 10

## 2023-02-13 NOTE — Progress Notes (Signed)
 NG tube placed per MD order. Tube is measured at 48cm depth. Stomach contents came out of tube after placement. Pt tolerated well. No s/s of discomfort noted. Xray done to verify placement.

## 2023-02-13 NOTE — TOC CM/SW Note (Signed)
 Transition of Care [facility]) - Inpatient Brief Assessment   Patient Details  Name: Candice Brown MRN: [id number] Date of Birth: October 03, 1931  [facility] CM/SW Contact:    Dalila Camellia SAUNDERS, LCSW Phone Number: 02/13/2023, 5:29 PM   Clinical Narrative:  Patient from [facility].  She has insurance, and a PCP.  Patient does not have any SDOH needs.  No anticipated TOC needs.  Transition of Care Asessment: Insurance and Status: Insurance coverage has been reviewed Patient has primary care physician: Yes Home environment has been reviewed: Yes from [facility]. Prior level of function:: Indep Prior/Current Home Services: No current home services Social Drivers of Health Review: SDOH reviewed no interventions necessary Readmission risk has been reviewed: Yes Transition of care needs: no transition of care needs at this time

## 2023-02-13 NOTE — Plan of Care (Signed)

## 2023-02-13 NOTE — Plan of Care (Signed)
  Problem: Education: Goal: Knowledge of General Education information will improve Description: Including pain rating scale, medication(s)/side effects and non-pharmacologic comfort measures Outcome: Progressing   Problem: Clinical Measurements: Goal: Ability to maintain clinical measurements within normal limits will improve Outcome: Progressing   Problem: Activity: Goal: Risk for activity intolerance will decrease Outcome: Progressing   Problem: Pain Management: Goal: General experience of comfort will improve Outcome: Progressing   Problem: Safety: Goal: Ability to remain free from injury will improve Outcome: Progressing   Problem: Skin Integrity: Goal: Risk for impaired skin integrity will decrease Outcome: Progressing

## 2023-02-13 NOTE — Progress Notes (Addendum)
 PROGRESS NOTE    Candice Brown  FMW:[id number] DOB: [birth year] DOA: 02/09/2023 PCP: Shepard Ade, MD   Brief Narrative: 89 year old PMH significant for hyperlipidemia, GERD, presented to the ED complaining of abdominal pain, nausea vomiting for the last 24 hours.  She has a past medical history of hysterectomy, appendectomy and cholecystectomy.  CT abdomen and pelvis concern for short segment abdominal small bowel right lower quadrant regional inflammatory  edematous changes in the mesentery a small volume ascites.  Consideration for localized infectious, inflammatory or ischemic enteritis.  He was started on IV Zosyn .   Assessment & Plan:   Principal Problem:   Enteritis Active Problems:   Malignant neoplasm of upper-outer quadrant of right breast in female, estrogen receptor positive (HCC)   HLD (hyperlipidemia)   GERD (gastroesophageal reflux disease)   Gastroenteritis  1-Acute enteritis: -CT abdomen and pelvis: Short segment of abnormal small bowel in the right lower quadrant with regional inflammatory edematous changes in the mesentery a small volume ascites.  Appears nonspecific.  Consideration includes localized infection, inflammatory or ischemic enteritis -Resume IV Zosyn , she continue to vomit.  -Change PProtonix to IV -Start IV fluids.  -CT abdomen have not been done yet, await result.  Addendum: Called by Radiologist CTA, negative for bowel ischemia, or necrosis, vessel patent. She does have more small bowel dilation and ascites, concern for bowel obstruction.  -Surgery Dr Emeline have been consulted.  Make NPO, IV fluids.   2-Hyperlipidemia -On crestor .   History of breast cancer Follow out patient.   GERD: Change PPI to IV  Hyponatremia: Improving  Resume IV fluids.   Hypokalemia: Bmet pending. Replete as needed.    Estimated body mass index is 20.9 kg/m as calculated from the following:   Height as of this encounter: 5' 3 (1.6 m).   Weight as of this  encounter: 53.5 kg.   DVT prophylaxis: Lovenox  Code Status: Full code Family Communication: Care discussed with patient. Updated Daughter over phone/  Disposition Plan:  Status is: Inpatient Remains inpatient appropriate because: management of Enteritis    Consultants:  None  Procedures:  none  Antimicrobials:    Subjective: She is alert, she is feeling better this am, she was vomiting last night bile content.  Report abdominal soreness, think is muscle pain from vomiting.   Objective: Vitals:   02/11/23 2046 02/12/23 0500 02/12/23 2123 02/13/23 0519  BP: (!) 158/69 (!) 154/70 (!) 161/74 135/75  Pulse: 71 62 70 75  Resp: 15  18 18   Temp: 97.9 F (36.6 C) 98 F (36.7 C) 98.1 F (36.7 C) 98 F (36.7 C)  TempSrc: Oral Oral Oral Oral  SpO2: 91% 92% 90% 93%  Weight:      Height:        Intake/Output Summary (Last 24 hours) at 02/13/2023 0753 Last data filed at 02/12/2023 1500 Gross per 24 hour  Intake 40.05 ml  Output 1 ml  Net 39.05 ml   Filed Weights   02/09/23 1111  Weight: 53.5 kg    Examination:  General exam: Appears calm and comfortable  Respiratory system: Clear to auscultation. Respiratory effort normal. Cardiovascular system: S1 & S2 heard, RRR. No JVD, murmurs, rubs, gallops or clicks. No pedal edema. Gastrointestinal system: Abdomen is nondistended, soft mild tender.  Central nervous system: Alert and oriented.  Extremities: Symmetric 5 x 5 power.   Data Reviewed: I have personally reviewed following labs and imaging studies  CBC: Recent Labs  Lab 02/09/23 1135 02/10/23 0507  02/11/23 0503 02/12/23 0523  WBC 13.1* 9.9 5.4 8.1  HGB 14.2 13.5 13.4 14.0  HCT 42.2 42.8 41.7 42.9  MCV 100.5* 102.1* 103.0* 100.0  PLT 279 272 280 315   Basic Metabolic Panel: Recent Labs  Lab 02/09/23 1135 02/10/23 0507 02/11/23 0503 02/12/23 0523 02/13/23 0532  NA 138 137 134* 129* 133*  K 4.0 4.3 4.7 3.5 3.4*  CL 103 103 100 93* 97*  CO2 25 26 27  24 27   GLUCOSE 121* 133* 130* 144* 98  BUN 26* 21 22 21 22   CREATININE 0.83 0.74 1.05* 0.89 0.80  CALCIUM  9.8 8.9 8.8* 8.7* 8.9   GFR: Estimated Creatinine Clearance: 39.4 mL/min (by C-G formula based on SCr of 0.8 mg/dL). Liver Function Tests: Recent Labs  Lab 02/09/23 1135  AST 29  ALT 18  ALKPHOS 45  BILITOT 0.7  PROT 7.3  ALBUMIN  4.2   Recent Labs  Lab 02/09/23 1135  LIPASE 88*   No results for input(s): AMMONIA in the last 168 hours. Coagulation Profile: No results for input(s): INR, PROTIME in the last 168 hours. Cardiac Enzymes: No results for input(s): CKTOTAL, CKMB, CKMBINDEX, TROPONINI in the last 168 hours. BNP (last 3 results) No results for input(s): PROBNP in the last 8760 hours. HbA1C: No results for input(s): HGBA1C in the last 72 hours. CBG: No results for input(s): GLUCAP in the last 168 hours. Lipid Profile: No results for input(s): CHOL, HDL, LDLCALC, TRIG, CHOLHDL, LDLDIRECT in the last 72 hours. Thyroid  Function Tests: No results for input(s): TSH, T4TOTAL, FREET4, T3FREE, THYROIDAB in the last 72 hours. Anemia Panel: No results for input(s): VITAMINB12, FOLATE, FERRITIN, TIBC, IRON, RETICCTPCT in the last 72 hours. Sepsis Labs: Recent Labs  Lab 02/09/23 1648 02/09/23 1956  LATICACIDVEN 0.8 0.9    Recent Results (from the past 240 hours)  Blood culture (routine x 2)     Status: None (Preliminary result)   Collection Time: 02/09/23  5:30 PM   Specimen: BLOOD  Result Value Ref Range Status   Specimen Description   Final    BLOOD LEFT ANTECUBITAL Performed at [facility], [address], [place], [place] [address]    Special Requests   Final    BOTTLES DRAWN AEROBIC AND ANAEROBIC Blood Culture adequate volume Performed at [facility], [address] [place], [place] [address]    Culture   Final    NO GROWTH 4 DAYS Performed at [facility], [address], [place], [place] [address]    Report Status PENDING  Incomplete  Blood culture (routine x 2)     Status: None (Preliminary result)   Collection Time: 02/09/23  5:45 PM   Specimen: BLOOD  Result Value Ref Range Status   Specimen Description   Final    BLOOD LEFT ANTECUBITAL Performed at [facility], [address] [place], [place] [address]    Special Requests   Final    BOTTLES DRAWN AEROBIC AND ANAEROBIC Blood Culture results may not be optimal due to an inadequate volume of blood received in culture bottles Performed at [facility], [address], [place], [place] [address]    Culture   Final    NO GROWTH 4 DAYS Performed at [facility], [address] [place], [place] [address]    Report Status PENDING  Incomplete  MRSA Next Gen by PCR, Nasal     Status: None   Collection Time:  02/09/23  9:06 PM   Specimen: Nasal Mucosa; Nasal Swab  Result Value Ref Range Status   MRSA by PCR Next Gen NOT DETECTED NOT DETECTED Final    Comment: (NOTE) The GeneXpert MRSA Assay (FDA approved for NASAL specimens only), is one component of a comprehensive MRSA colonization surveillance program. It is not intended to diagnose MRSA infection nor to guide or monitor treatment for MRSA infections. Test performance is not FDA approved in patients less than 6 years old. Performed at [facility], [address] [place], [place] [address]          Radiology Studies: No results found.      Scheduled Meds:  amoxicillin -clavulanate  1 tablet Oral Q12H   anastrozole   1 mg Oral Daily   enoxaparin  (LOVENOX ) injection  40 mg Subcutaneous Q24H   feeding supplement  237 mL Oral BID BM   pantoprazole   40 mg Oral Daily   polyethylene glycol  17 g Oral Daily   pregabalin   75 mg Oral Daily   rosuvastatin   10 mg Oral Once per day on Monday Thursday   Continuous Infusions:   LOS: 3 days    Time  spent: 35 minutes    Manessa Buley A Chinwe Lope, MD [facility]   If 7PM-7AM, please contact night-coverage [email or web address]  02/13/2023, 7:53 AM

## 2023-02-13 NOTE — Plan of Care (Signed)
  Problem: Education: Goal: Knowledge of General Education information will improve Description: Including pain rating scale, medication(s)/side effects and non-pharmacologic comfort measures Outcome: Progressing   Problem: Coping: Goal: Level of anxiety will decrease Outcome: Progressing   Problem: Pain Management: Goal: General experience of comfort will improve Outcome: Progressing   Problem: Safety: Goal: Ability to remain free from injury will improve Outcome: Progressing   Problem: Skin Integrity: Goal: Risk for impaired skin integrity will decrease Outcome: Progressing   Problem: Nutrition: Goal: Adequate nutrition will be maintained Outcome: Not Progressing

## 2023-02-13 NOTE — Consult Note (Signed)
 Reason for Consult: SBO Referring Physician: Brandan Glauber is an 89 y.o. female.  HPI:  This is an 89 year old female with a history of right breast cancer and previous diverticulitis who presented 02/09/23 with lower abdominal pain, nausea, and vomiting.  She has had previous hysterectomy, appendectomy, and cholecystectomy.  CT abd/ pelvis on 12/28 revealed a short segment of thickened small bowel in the right lower quadrant, consistent with enteritis.  The patient was admitted and started on empiric Zosyn .  Her symptoms are minimally improved over the last several days.  She did have a couple of small hard bowel movements over the last two days.  Due to her lack of improvement, a CT angio was performed today.  This shows increasing distention of the small bowel with a right pelvis transition zone.  The patient has been on liquids for the last several days, but is now NPO.  Past Medical History:  Diagnosis Date   Anemia    Arthritis    Colon polyps    hyperplastic   Complication of anesthesia    hard to wake up   Depression    Diverticulosis    GERD (gastroesophageal reflux disease)    Hiatal hernia    Hyperlipidemia    IBS (irritable bowel syndrome)    Internal hemorrhoids    Lung nodule    Occipital neuralgia    Peripheral neuropathy     Past Surgical History:  Procedure Laterality Date   ABDOMINAL HYSTERECTOMY  1972   APPENDECTOMY  1959   BREAST LUMPECTOMY WITH RADIOACTIVE SEED LOCALIZATION Right 07/18/2021   Procedure: RIGHT BREAST LUMPECTOMY WITH RADIOACTIVE SEED LOCALIZATION;  Surgeon: Aron Shoulders, MD;  Location: [facility];  Service: General;  Laterality: Right;   CATARACT EXTRACTION Bilateral    left 03/2014, right eye 06/2016   CHOLECYSTECTOMY      Family History  Problem Relation Age of Onset   Breast cancer Mother 70   Heart disease Father    CVA Father    Heart attack Father 43   Heart attack Paternal Grandfather    Colon cancer Neg Hx     Stomach cancer Neg Hx    Rectal cancer Neg Hx    Esophageal cancer Neg Hx     Social History:  reports that she has been smoking cigarettes. She has never used smokeless tobacco. She reports current alcohol  use. She reports that she does not use drugs.  Allergies:  Allergies  Allergen Reactions   Linzess [Linaclotide] Diarrhea    Increased leg cramps   Oxycodone Nausea Only     ( made very sick)   Simvastatin Other (See Comments)    Leg cramps   Benzalkonium Chloride Rash    In neosporin   Neosporin [Neomycin-Polymyxin-Gramicidin] Rash   Tape Rash    Adhesive patches of any kind    Medications:  Prior to Admission medications   Medication Sig Start Date End Date Taking? Authorizing Provider  anastrozole  (ARIMIDEX ) 1 MG tablet Take 1 tablet (1 mg total) by mouth daily. 08/17/22  Yes Iruku, Praveena, MD  Aspirin-Caffeine 500-32.5 MG TABS Take 1 tablet by mouth as needed (pain). 07/11/16  Yes [provider]  diphenhydramine-acetaminophen  (TYLENOL  PM) 25-500 MG TABS tablet Take 2 tablets by mouth at bedtime as needed (Sleep).   Yes [provider]  Methylcellulose, Laxative, (CITRUCEL PO) Take 1 tablet by mouth daily.   Yes [provider]  Multiple Vitamin (MULTI VITAMIN) TABS Take 1  tablet by mouth daily. Senior woman   Yes [provider]  nicotine polacrilex (NICORETTE) 4 MG gum Take 4 mg by mouth as needed for smoking cessation.   Yes [provider]  NON FORMULARY Take 2 tablets by mouth at bedtime. [place] leg cramp pills   Yes [provider]  pantoprazole  (PROTONIX ) 40 MG tablet Take 40 mg by mouth daily.    Yes [provider]  polycarbophil (FIBERCON) 625 MG tablet Take 1,250 mg by mouth at bedtime.   Yes [provider]  Polyvinyl Alcohol -Povidone (REFRESH OP) Place 1 drop into both eyes daily as needed (Dry eye).   Yes [provider]  pregabalin  (LYRICA ) 150 MG capsule Take 150 mg by mouth  daily.   Yes [provider]  Probiotic Product (ALIGN PO) Take 1 tablet by mouth daily.   Yes [provider]  rosuvastatin  (CRESTOR ) 10 MG tablet Take 10 mg by mouth 2 (two) times a week. 06/28/15  Yes [provider]     Results for orders placed or performed during the hospital encounter of 02/09/23 (from the past 48 hours)  CBC     Status: None   Collection Time: 02/12/23  5:23 AM  Result Value Ref Range   WBC 8.1 4.0 - 10.5 K/uL   RBC 4.29 3.87 - 5.11 MIL/uL   Hemoglobin 14.0 12.0 - 15.0 g/dL   HCT 57.0 63.9 - 53.9 %   MCV 100.0 80.0 - 100.0 fL   MCH 32.6 26.0 - 34.0 pg   MCHC 32.6 30.0 - 36.0 g/dL   RDW 88.0 88.4 - 84.4 %   Platelets 315 150 - 400 K/uL   nRBC 0.0 0.0 - 0.2 %    Comment: Performed at [facility], [address] [place], [place] [address]  Basic metabolic panel     Status: Abnormal   Collection Time: 02/12/23  5:23 AM  Result Value Ref Range   Sodium 129 (L) 135 - 145 mmol/L   Potassium 3.5 3.5 - 5.1 mmol/L   Chloride 93 (L) 98 - 111 mmol/L   CO2 24 22 - 32 mmol/L   Glucose, Bld 144 (H) 70 - 99 mg/dL    Comment: Glucose reference range applies only to samples taken after fasting for at least 8 hours.   BUN 21 8 - 23 mg/dL   Creatinine, Ser 9.10 0.44 - 1.00 mg/dL   Calcium  8.7 (L) 8.9 - 10.3 mg/dL   GFR, Estimated >39 >39 mL/min    Comment: (NOTE) Calculated using the CKD-EPI Creatinine Equation (2021)    Anion gap 12 5 - 15    Comment: Performed at [facility], [address], [place], [place] [address]  Basic metabolic panel     Status: Abnormal   Collection Time: 02/13/23  5:32 AM  Result Value Ref Range   Sodium 133 (L) 135 - 145 mmol/L   Potassium 3.4 (L) 3.5 - 5.1 mmol/L   Chloride 97 (L) 98 - 111 mmol/L   CO2 27 22 - 32 mmol/L   Glucose, Bld 98 70 - 99 mg/dL    Comment: Glucose reference range applies only to samples taken after fasting for at least 8 hours.   BUN 22 8 - 23  mg/dL   Creatinine, Ser 9.19 0.44 - 1.00 mg/dL   Calcium  8.9 8.9 - 10.3 mg/dL   GFR, Estimated >39 >39 mL/min    Comment: (NOTE) Calculated using the CKD-EPI Creatinine Equation (2021)  Anion gap 9 5 - 15    Comment: Performed at [facility], [address], [place], [place] [address]    CT Angio Abd/Pel w/ and/or w/o Result Date: 02/13/2023 CLINICAL DATA:  Mesenteric ischemia. EXAM: CTA ABDOMEN AND PELVIS WITHOUT AND WITH CONTRAST TECHNIQUE: Multidetector CT imaging of the abdomen and pelvis was performed using the standard protocol during bolus administration of intravenous contrast. Multiplanar reconstructed images and MIPs were obtained and reviewed to evaluate the vascular anatomy. RADIATION DOSE REDUCTION: This exam was performed according to the departmental dose-optimization program which includes automated exposure control, adjustment of the mA and/or kV according to patient size and/or use of iterative reconstruction technique. CONTRAST:  OMNIPAQUE  IOHEXOL  350 MG/ML SOLN COMPARISON:  Abdomen and pelvis CT 02/09/2023 FINDINGS: VASCULAR Aorta: Normal caliber aorta without aneurysm, dissection, vasculitis or significant stenosis. Moderate atherosclerotic disease. Celiac: Patent without evidence of aneurysm, dissection, vasculitis or significant stenosis. SMA: Patent without evidence of aneurysm, dissection, vasculitis or significant stenosis. Renals: Both renal arteries are patent without evidence of aneurysm, dissection, vasculitis, fibromuscular dysplasia or significant stenosis. IMA: Patent without evidence of aneurysm, dissection, vasculitis or significant stenosis. Inflow: Patent without evidence of aneurysm, dissection, vasculitis or significant stenosis. Proximal Outflow: Bilateral common femoral and visualized portions of the superficial and profunda femoral arteries are patent without evidence of aneurysm, dissection, vasculitis or significant stenosis. Veins:  Portal vein and superior mesenteric vein are patent. No discernible venous thrombosis within the small bowel mesentery Review of the MIP images confirms the above findings. NON-VASCULAR Lower chest: Tiny bilateral pleural effusions with dependent atelectasis. Hepatobiliary: No suspicious focal abnormality within the liver parenchyma. Small area of low attenuation in the anterior liver, adjacent to the falciform ligament, is in a characteristic location for focal fatty deposition. Gallbladder is surgically absent. No intrahepatic or extrahepatic biliary dilation. Pancreas: No focal mass lesion. No dilatation of the main duct. No intraparenchymal cyst. No peripancreatic edema. Spleen: No splenomegaly. No suspicious focal mass lesion. Adrenals/Urinary Tract: No adrenal nodule or mass. Tiny well-defined homogeneous low-density lesions in both kidneys are too small to characterize but are statistically most likely benign and probably cysts. No followup imaging is recommended. No evidence for hydroureter. The urinary bladder appears normal for the degree of distention. Stomach/Bowel: Stomach is unremarkable. No gastric wall thickening. No evidence of outlet obstruction. Duodenum is normally positioned as is the ligament of Treitz. 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 On today's exam, there is a fluid collection in the region of the diverticulum with multiple tiny gas bubbles associated. Coronal image 72/14 raises the question of a contained fluid collection, but there is no duodenal wall thickening in these changes are probably all related to the patient's known diverticulum. Small bowel loops have become more diffusely distended in the interval measuring up to 3.2 cm diameter. No discernible small bowel wall thickening and no areas of small-bowel mural non-enhancement are evident. There is no small bowel pneumatosis. Patient is noted to have a completely decompressed distal and terminal ileum  with an apparent abrupt transition zone in the right pelvis (see axial 136/5 and coronal 48/14). The appendix is not visible compatible with reported history of appendectomy. No gross colonic mass. No colonic wall thickening. Diverticuli are seen scattered along the entire length of the colon without CT findings of diverticulitis. Lymphatic: There is no gastrohepatic or hepatoduodenal ligament lymphadenopathy. No retroperitoneal or mesenteric lymphadenopathy. No pelvic sidewall lymphadenopathy. Reproductive: Hysterectomy.  There is no adnexal mass. Other:  Increasing free fluid is seen in the abdomen and pelvis with mesenteric edema/congestion also progressive since prior. Musculoskeletal: Tiny umbilical hernia contains fat and trace fluid. Gas bubbles in the subcutaneous fat of the right lower anterior abdominal wall likely related to an injection site. No worrisome lytic or sclerotic osseous abnormality. IMPRESSION: 1. No definite CT evidence for mesenteric ischemia. Mesenteric arterial anatomy is patent. No evidence for filling defect within the central mesenteric venous anatomy. No small bowel wall thickening. No small bowel pneumatosis. No areas of small-bowel wall non-enhancement are identified. No intraperitoneal free gas. 2. Small bowel loops have become more diffusely distended in the interval measuring up to 3.2 cm diameter. Patient is noted to have a completely decompressed distal and terminal ileum with an apparent abrupt transition zone in the right pelvis. This abrupt transition zone corresponds to the abnormality identified on the CT scan from 03-01-2023, but appears more pronounced on today's exam likely due to the increased bowel distension. Imaging features raise concern for small-bowel obstruction, potentially secondary to an adhesion. Internal hernia/closed loop obstruction is not excluded. 3. Increasing free fluid in the abdomen and pelvis with mesenteric edema/congestion also progressive since  prior. 4. 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 On today's exam, there is a fluid collection in the region of the diverticulum with multiple tiny gas bubbles associated. The anatomy of the diverticulum is not well demonstrated on today's study and these findings are likely related to the duodenal diverticulum. Contain retroperitoneal duodenal perforation is considered less likely but not entirely excluded. 5. Tiny bilateral pleural effusions with dependent atelectasis. 6.  Aortic Atherosclerosis (ICD10-I70.0). I discussed these findings by telephone with Dr. Regalado at approximately 1425 hours on 02/13/2023. Electronically Signed   By: Camellia Candle M.D.   On: 02/13/2023 14:25    Review of Systems  HENT:  Negative for ear discharge, ear pain, hearing loss and tinnitus.   Eyes:  Negative for photophobia and pain.  Respiratory:  Negative for cough and shortness of breath.   Cardiovascular:  Negative for chest pain.  Gastrointestinal:  Positive for abdominal pain, constipation, nausea and vomiting.  Genitourinary:  Negative for dysuria, flank pain, frequency and urgency.  Musculoskeletal:  Negative for back pain, myalgias and neck pain.  Neurological:  Negative for dizziness and headaches.  Hematological:  Does not bruise/bleed easily.  Psychiatric/Behavioral:  The patient is not nervous/anxious.    Blood pressure 117/65, pulse 62, temperature 97.9 F (36.6 C), temperature source Oral, resp. rate 17, height 5' 3 (1.6 m), weight 53.5 kg, SpO2 93%. Physical Exam Constitutional:  WDWN in NAD, conversant, no obvious deformities; lying in bed comfortably Eyes:  Pupils equal, round; sclera anicteric; moist conjunctiva; no lid lag HENT:  Oral mucosa moist;  Neck:  No masses palpated, trachea midline; no thyromegaly Lungs:  CTA bilaterally; normal respiratory effort CV:  Regular rate and rhythm; no murmurs; extremities well-perfused with no edema Abd:  distended, no  peritoneal signs, tender across lower abdomen, mostly on the right Musc:  Unable to assess gait; no apparent clubbing or cyanosis in extremities Lymphatic:  No palpable cervical or axillary lymphadenopathy Skin:  Warm, dry; no sign of jaundice Psychiatric - alert and oriented x 4; calm mood and affect  Assessment/Plan: Small bowel obstruction (probably partial) with no signs of ischemia or perforation SBO likely secondary to adhesions from previous surgery.  Recs:  NPO x ice chips Hold any long-acting anticoagulation NG tube SBO protocol I explained to the patient and  her daughter that we will attempt conservative therapy with decompression/ Gastrografin , but if there is no improvement, she might need surgery.  They understand and agree with the plan.  Donnice POUR Tyshay Adee 02/13/2023, 4:47 PM

## 2023-02-14 ENCOUNTER — Inpatient Hospital Stay (HOSPITAL_COMMUNITY): Payer: Medicare Other

## 2023-02-14 DIAGNOSIS — K529 Noninfective gastroenteritis and colitis, unspecified: Secondary | ICD-10-CM | POA: Diagnosis not present

## 2023-02-14 LAB — CBC
HCT: 41.5 % (ref 36.0–46.0)
Hemoglobin: 13.6 g/dL (ref 12.0–15.0)
MCH: 33.1 pg (ref 26.0–34.0)
MCHC: 32.8 g/dL (ref 30.0–36.0)
MCV: 101 fL — ABNORMAL HIGH (ref 80.0–100.0)
Platelets: 298 10*3/uL (ref 150–400)
RBC: 4.11 MIL/uL (ref 3.87–5.11)
RDW: 11.9 % (ref 11.5–15.5)
WBC: 9.2 10*3/uL (ref 4.0–10.5)
nRBC: 0 % (ref 0.0–0.2)

## 2023-02-14 LAB — CULTURE, BLOOD (ROUTINE X 2)
Culture: NO GROWTH
Culture: NO GROWTH
Special Requests: ADEQUATE

## 2023-02-14 LAB — BASIC METABOLIC PANEL
Anion gap: 11 (ref 5–15)
BUN: 21 mg/dL (ref 8–23)
CO2: 22 mmol/L (ref 22–32)
Calcium: 8.9 mg/dL (ref 8.9–10.3)
Chloride: 99 mmol/L (ref 98–111)
Creatinine, Ser: 0.69 mg/dL (ref 0.44–1.00)
GFR, Estimated: >60 mL/min (ref >60)
Glucose, Bld: 97 mg/dL (ref 70–99)
Potassium: 4.1 mmol/L (ref 3.5–5.1)
Sodium: 132 mmol/L — ABNORMAL LOW (ref 135–145)

## 2023-02-14 LAB — VITAMIN B12: Vitamin B-12: 354 pg/mL (ref 180–914)

## 2023-02-14 LAB — MAGNESIUM: Magnesium: 2.2 mg/dL (ref 1.7–2.4)

## 2023-02-14 MED ORDER — HYDRALAZINE HCL 20 MG/ML IJ SOLN
5.0000 mg | Freq: Four times a day (QID) | INTRAMUSCULAR | Status: DC | PRN
  Administered 2023-02-14: 5 mg via INTRAVENOUS
  Filled 2023-02-14: qty 1

## 2023-02-14 MED ORDER — PHENOL 1.4 % MT LIQD
1.0000 | OROMUCOSAL | Status: DC | PRN
  Administered 2023-02-14: 1 via OROMUCOSAL
  Filled 2023-02-14: qty 177

## 2023-02-14 MED ORDER — PROCHLORPERAZINE EDISYLATE 10 MG/2ML IJ SOLN
5.0000 mg | Freq: Four times a day (QID) | INTRAMUSCULAR | Status: DC | PRN
  Administered 2023-02-14: 5 mg via INTRAVENOUS
  Filled 2023-02-14: qty 2

## 2023-02-14 MED ORDER — POTASSIUM CHLORIDE 2 MEQ/ML IV SOLN: INTRAVENOUS | Status: DC

## 2023-02-14 NOTE — Progress Notes (Signed)
 Subjective/Chief Complaint: Small amount of flatus this morning The 8 hour plain film shows contrast only in the stomach.  The film has not been read by Radiology yet. No recorded NG output, but about 200 cc in canister  Objective: Vital signs in last 24 hours: Temp:  [97.8 F (36.6 C)-98.3 F (36.8 C)] 97.8 F (36.6 C) (01/02 0415) Pulse Rate:  [62-69] 65 (01/02 0415) Resp:  [15-17] 15 (01/02 0415) BP: (117-151)/(62-68) 151/68 (01/02 0415) SpO2:  [90 %-93 %] 90 % (01/02 0415) Last BM Date : 02/12/23  Intake/Output from previous day: 01/01 0701 - 01/02 0700 In: 1291.9 [P.O.:480; I.V.:764.9; IV Piggyback:47.1] Out: 3 [Urine:3] Intake/Output this shift: No intake/output data recorded.  WDWn in NAD Abd - mildly distended, but noticeably softer than yesterday; non-tender  Lab Results:  Recent Labs    02/12/23 0523 02/14/23 0534  WBC 8.1 9.2  HGB 14.0 13.6  HCT 42.9 41.5  PLT 315 298   BMET Recent Labs    02/13/23 0532 02/14/23 0534  NA 133* 132*  K 3.4* 4.1  CL 97* 99  CO2 27 22  GLUCOSE 98 97  BUN 22 21  CREATININE 0.80 0.69  CALCIUM  8.9 8.9     Studies/Results: DG Abd Portable 1V-Small Bowel Protocol-Position Verification Result Date: 02/13/2023 CLINICAL DATA:  Check gastric catheter placement EXAM: PORTABLE ABDOMEN - 1 VIEW COMPARISON:  None Available. FINDINGS: Gastric catheter is noted within the stomach. Contrast is noted from recent CT examination. Small-bowel dilatation is seen similar to that noted on recent CT examination. No free air is noted. IMPRESSION: Gastric catheter within the stomach. Persistent small bowel dilatation. Electronically Signed   By: Oneil Devonshire M.D.   On: 02/13/2023 20:24   CT Angio Abd/Pel w/ and/or w/o Result Date: 02/13/2023 CLINICAL DATA:  Mesenteric ischemia. EXAM: CTA ABDOMEN AND PELVIS WITHOUT AND WITH CONTRAST TECHNIQUE: Multidetector CT imaging of the abdomen and pelvis was performed using the standard protocol during  bolus administration of intravenous contrast. Multiplanar reconstructed images and MIPs were obtained and reviewed to evaluate the vascular anatomy. RADIATION DOSE REDUCTION: This exam was performed according to the departmental dose-optimization program which includes automated exposure control, adjustment of the mA and/or kV according to patient size and/or use of iterative reconstruction technique. CONTRAST:  OMNIPAQUE  IOHEXOL  350 MG/ML SOLN COMPARISON:  Abdomen and pelvis CT 02/09/2023 FINDINGS: VASCULAR Aorta: Normal caliber aorta without aneurysm, dissection, vasculitis or significant stenosis. Moderate atherosclerotic disease. Celiac: Patent without evidence of aneurysm, dissection, vasculitis or significant stenosis. SMA: Patent without evidence of aneurysm, dissection, vasculitis or significant stenosis. Renals: Both renal arteries are patent without evidence of aneurysm, dissection, vasculitis, fibromuscular dysplasia or significant stenosis. IMA: Patent without evidence of aneurysm, dissection, vasculitis or significant stenosis. Inflow: Patent without evidence of aneurysm, dissection, vasculitis or significant stenosis. Proximal Outflow: Bilateral common femoral and visualized portions of the superficial and profunda femoral arteries are patent without evidence of aneurysm, dissection, vasculitis or significant stenosis. Veins: Portal vein and superior mesenteric vein are patent. No discernible venous thrombosis within the small bowel mesentery Review of the MIP images confirms the above findings. NON-VASCULAR Lower chest: Tiny bilateral pleural effusions with dependent atelectasis. Hepatobiliary: No suspicious focal abnormality within the liver parenchyma. Small area of low attenuation in the anterior liver, adjacent to the falciform ligament, is in a characteristic location for focal fatty deposition. Gallbladder is surgically absent. No intrahepatic or extrahepatic biliary dilation. Pancreas: No  focal mass lesion. No dilatation of the main duct.  No intraparenchymal cyst. No peripancreatic edema. Spleen: No splenomegaly. No suspicious focal mass lesion. Adrenals/Urinary Tract: No adrenal nodule or mass. Tiny well-defined homogeneous low-density lesions in both kidneys are too small to characterize but are statistically most likely benign and probably cysts. No followup imaging is recommended. No evidence for hydroureter. The urinary bladder appears normal for the degree of distention. Stomach/Bowel: Stomach is unremarkable. No gastric wall thickening. No evidence of outlet obstruction. Duodenum is normally positioned as is the ligament of Treitz. 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 On today's exam, there is a fluid collection in the region of the diverticulum with multiple tiny gas bubbles associated. Coronal image 72/14 raises the question of a contained fluid collection, but there is no duodenal wall thickening in these changes are probably all related to the patient's known diverticulum. Small bowel loops have become more diffusely distended in the interval measuring up to 3.2 cm diameter. No discernible small bowel wall thickening and no areas of small-bowel mural non-enhancement are evident. There is no small bowel pneumatosis. Patient is noted to have a completely decompressed distal and terminal ileum with an apparent abrupt transition zone in the right pelvis (see axial 136/5 and coronal 48/14). The appendix is not visible compatible with reported history of appendectomy. No gross colonic mass. No colonic wall thickening. Diverticuli are seen scattered along the entire length of the colon without CT findings of diverticulitis. Lymphatic: There is no gastrohepatic or hepatoduodenal ligament lymphadenopathy. No retroperitoneal or mesenteric lymphadenopathy. No pelvic sidewall lymphadenopathy. Reproductive: Hysterectomy.  There is no adnexal mass. Other: Increasing free  fluid is seen in the abdomen and pelvis with mesenteric edema/congestion also progressive since prior. Musculoskeletal: Tiny umbilical hernia contains fat and trace fluid. Gas bubbles in the subcutaneous fat of the right lower anterior abdominal wall likely related to an injection site. No worrisome lytic or sclerotic osseous abnormality. IMPRESSION: 1. No definite CT evidence for mesenteric ischemia. Mesenteric arterial anatomy is patent. No evidence for filling defect within the central mesenteric venous anatomy. No small bowel wall thickening. No small bowel pneumatosis. No areas of small-bowel wall non-enhancement are identified. No intraperitoneal free gas. 2. Small bowel loops have become more diffusely distended in the interval measuring up to 3.2 cm diameter. Patient is noted to have a completely decompressed distal and terminal ileum with an apparent abrupt transition zone in the right pelvis. This abrupt transition zone corresponds to the abnormality identified on the CT scan from March 05, 2023, but appears more pronounced on today's exam likely due to the increased bowel distension. Imaging features raise concern for small-bowel obstruction, potentially secondary to an adhesion. Internal hernia/closed loop obstruction is not excluded. 3. Increasing free fluid in the abdomen and pelvis with mesenteric edema/congestion also progressive since prior. 4. 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 On today's exam, there is a fluid collection in the region of the diverticulum with multiple tiny gas bubbles associated. The anatomy of the diverticulum is not well demonstrated on today's study and these findings are likely related to the duodenal diverticulum. Contain retroperitoneal duodenal perforation is considered less likely but not entirely excluded. 5. Tiny bilateral pleural effusions with dependent atelectasis. 6.  Aortic Atherosclerosis (ICD10-I70.0). I discussed these findings by  telephone with Dr. Regalado at approximately 1425 hours on 02/13/2023. Electronically Signed   By: Camellia Candle M.D.   On: 02/13/2023 14:25    Anti-infectives: Anti-infectives (From admission, onward)    Start     Dose/Rate Route  Frequency Ordered Stop   02/13/23 1200  piperacillin -tazobactam (ZOSYN ) IVPB 3.375 g        3.375 g 12.5 mL/hr over 240 Minutes Intravenous Every 8 hours 02/13/23 0918     02/12/23 1230  amoxicillin -clavulanate (AUGMENTIN ) 875-125 MG per tablet 1 tablet  Status:  Discontinued        1 tablet Oral Every 12 hours 02/12/23 1131 02/13/23 0902   02/10/23 1400  piperacillin -tazobactam (ZOSYN ) IVPB 3.375 g  Status:  Discontinued        3.375 g 12.5 mL/hr over 240 Minutes Intravenous Every 8 hours 02/10/23 1205 02/12/23 1131   02/10/23 1000  amoxicillin -clavulanate (AUGMENTIN ) 875-125 MG per tablet 1 tablet  Status:  Discontinued        1 tablet Oral Every 12 hours 02/10/23 0809 02/10/23 1205   02/09/23 1700  piperacillin -tazobactam (ZOSYN ) IVPB 3.375 g        3.375 g 12.5 mL/hr over 240 Minutes Intravenous  Once 02/09/23 1652 02/09/23 2145       Assessment/Plan: Small bowel obstruction (probably partial) with no signs of ischemia or perforation SBO likely secondary to adhesions from previous surgery.    NPO x ice chips Hold any long-acting anticoagulation NG tube SBO protocol - 24 hour film at 6 PM  Will continue to try to avoid surgery if possible, but if no improvement in the next day or two, may need to consider surgical intervention.    LOS: 4 days    Donnice MARLA Lima 02/14/2023

## 2023-02-14 NOTE — Progress Notes (Signed)
 PROGRESS NOTE    Candice Brown  FMW:[id number] DOB: 28-Mar-1931 DOA: 02/09/2023 PCP: Shepard Ade, MD   Brief Narrative: 89 year old PMH significant for hyperlipidemia, GERD, presented to the ED complaining of abdominal pain, nausea vomiting for the last 24 hours.  She has a past medical history of hysterectomy, appendectomy and cholecystectomy.  CT abdomen and pelvis concern for short segment abdominal small bowel right lower quadrant regional inflammatory  edematous changes in the mesentery a small volume ascites.  Consideration for localized infectious, inflammatory or ischemic enteritis.  He was started on IV Zosyn .  Patient failed to improve repeated CT abdomen and pelvis 1/1 showed finding consistent with SBO.  General surgery consulted.  NG tube placed, small bowel protocol was started.  Assessment & Plan:   Principal Problem:   Enteritis Active Problems:   Malignant neoplasm of upper-outer quadrant of right breast in female, estrogen receptor positive (HCC)   HLD (hyperlipidemia)   GERD (gastroesophageal reflux disease)   Gastroenteritis  1-Acute enteritis, SBO: -Patient presented with abdominal pain, nausea vomiting, constipated.  Initially CT finding concerning for enteritis.  She was started on IV antibiotics.  She failed to improve, repeated CT abdomen and pelvis showed SBO. -Continue with IV Zosyn .  -Protonix   IV -Continue with IV fluids.  -1/01: CTA, negative for bowel ischemia, or necrosis, vessel patent. She does have more small bowel dilation and ascites, concern for bowel obstruction.  -Surgery Dr Emeline have been consulted.  Make NPO, IV fluids. NG tube placed 02/13/2023. If no improvement in 1-2 days might require Sx intervention   2-Hyperlipidemia -On crestor .   History of breast cancer Follow out patient.   GERD:  PPI  IV  Hyponatremia: Improving  Resume IV fluids.   Hypokalemia:Replete as needed.    Estimated body mass index is 20.9 kg/m as  calculated from the following:   Height as of this encounter: 5' 3 (1.6 m).   Weight as of this encounter: 53.5 kg.   DVT prophylaxis: Lovenox  Code Status: Full code Family Communication: Care discussed with patient. Updated Daughter over phone/ 1/01 Disposition Plan:  Status is: Inpatient Remains inpatient appropriate because: management of Enteritis    Consultants:  None  Procedures:  none  Antimicrobials:    Subjective: She is not feeling well. Report nausea. Has NG tube in place.  Still with abdominal pain. Passing some flatus.   Objective: Vitals:   02/13/23 1356 02/13/23 1939 02/14/23 0415 02/14/23 1218  BP: 117/65 (!) 142/62 (!) 151/68 (!) 173/69  Pulse: 62 69 65 69  Resp: 17 15 15 18   Temp: 97.9 F (36.6 C) 98.3 F (36.8 C) 97.8 F (36.6 C) (!) 97.5 F (36.4 C)  TempSrc: Oral  Oral   SpO2: 93% 91% 90% 92%  Weight:      Height:        Intake/Output Summary (Last 24 hours) at 02/14/2023 1321 Last data filed at 02/14/2023 0300 Gross per 24 hour  Intake 811.94 ml  Output 1 ml  Net 810.94 ml   Filed Weights   02/09/23 1111  Weight: 53.5 kg    Examination:  General exam: NAD, NG tube in place Respiratory system: CTA Cardiovascular system: S 1, S 2 RRR Gastrointestinal system: Abdomen distended, tender, no rigidity  Extremities: no edema   Data Reviewed: I have personally reviewed following labs and imaging studies  CBC: Recent Labs  Lab 02/09/23 1135 02/10/23 0507 02/11/23 0503 02/12/23 0523 02/14/23 0534  WBC 13.1* 9.9 5.4 8.1  9.2  HGB 14.2 13.5 13.4 14.0 13.6  HCT 42.2 42.8 41.7 42.9 41.5  MCV 100.5* 102.1* 103.0* 100.0 101.0*  PLT 279 272 280 315 298   Basic Metabolic Panel: Recent Labs  Lab 02/10/23 0507 02/11/23 0503 02/12/23 0523 02/13/23 0532 02/14/23 0534  NA 137 134* 129* 133* 132*  K 4.3 4.7 3.5 3.4* 4.1  CL 103 100 93* 97* 99  CO2 26 27 24 27 22   GLUCOSE 133* 130* 144* 98 97  BUN 21 22 21 22 21   CREATININE 0.74  1.05* 0.89 0.80 0.69  CALCIUM  8.9 8.8* 8.7* 8.9 8.9  MG  --   --   --   --  2.2   GFR: Estimated Creatinine Clearance: 39.4 mL/min (by C-G formula based on SCr of 0.69 mg/dL). Liver Function Tests: Recent Labs  Lab 02/09/23 1135  AST 29  ALT 18  ALKPHOS 45  BILITOT 0.7  PROT 7.3  ALBUMIN  4.2   Recent Labs  Lab 02/09/23 1135  LIPASE 88*   No results for input(s): AMMONIA in the last 168 hours. Coagulation Profile: No results for input(s): INR, PROTIME in the last 168 hours. Cardiac Enzymes: No results for input(s): CKTOTAL, CKMB, CKMBINDEX, TROPONINI in the last 168 hours. BNP (last 3 results) No results for input(s): PROBNP in the last 8760 hours. HbA1C: No results for input(s): HGBA1C in the last 72 hours. CBG: No results for input(s): GLUCAP in the last 168 hours. Lipid Profile: No results for input(s): CHOL, HDL, LDLCALC, TRIG, CHOLHDL, LDLDIRECT in the last 72 hours. Thyroid  Function Tests: No results for input(s): TSH, T4TOTAL, FREET4, T3FREE, THYROIDAB in the last 72 hours. Anemia Panel: Recent Labs    02/14/23 0534  VITAMINB12 354   Sepsis Labs: Recent Labs  Lab 02/09/23 1648 02/09/23 1956  LATICACIDVEN 0.8 0.9    Recent Results (from the past 240 hours)  Blood culture (routine x 2)     Status: None   Collection Time: 02/09/23  5:30 PM   Specimen: BLOOD  Result Value Ref Range Status   Specimen Description   Final    BLOOD LEFT ANTECUBITAL Performed at [facility], [address] [place], [place] [address]    Special Requests   Final    BOTTLES DRAWN AEROBIC AND ANAEROBIC Blood Culture adequate volume Performed at [facility], [address] [place], [place] [address]    Culture   Final    NO GROWTH 5 DAYS Performed at [facility], [address], [place], [place] [address]    Report Status 02/14/2023 FINAL  Final  Blood culture (routine x 2)      Status: None   Collection Time: 02/09/23  5:45 PM   Specimen: BLOOD  Result Value Ref Range Status   Specimen Description   Final    BLOOD LEFT ANTECUBITAL Performed at [facility], [address] [place], [place] [address]    Special Requests   Final    BOTTLES DRAWN AEROBIC AND ANAEROBIC Blood Culture results may not be optimal due to an inadequate volume of blood received in culture bottles Performed at [facility], [address] [place], [place] [address]    Culture   Final    NO GROWTH 5 DAYS Performed at [facility], [address], [place], [place] [address]    Report Status 02/14/2023 FINAL  Final  MRSA Next Gen by PCR, Nasal     Status:  None   Collection Time: 02/09/23  9:06 PM   Specimen: Nasal Mucosa; Nasal Swab  Result Value Ref Range Status   MRSA by PCR Next Gen NOT DETECTED NOT DETECTED Final    Comment: (NOTE) The GeneXpert MRSA Assay (FDA approved for NASAL specimens only), is one component of a comprehensive MRSA colonization surveillance program. It is not intended to diagnose MRSA infection nor to guide or monitor treatment for MRSA infections. Test performance is not FDA approved in patients less than 50 years old. Performed at [facility], [address], [place], [place] [address]          Radiology Studies: DG Abd Portable 1V-Small Bowel Obstruction Protocol-initial, 8 hr delay Result Date: 02/14/2023 CLINICAL DATA:  Follow-up exam for small bowel obstruction. EXAM: PORTABLE ABDOMEN - 1 VIEW COMPARISON:  02/13/2023 FINDINGS: Enteric tube tip is scratch set enteric tube is again noted with tip and side port in the stomach. Enteric contrast material opacifies the gastric fundus. Only a small amount of dilute enteric contrast material is noted in the expected location of the proximal duodenum. No significant antegrade progression of the contrast material into the small bowel noted.  Persistent dilated small bowel loops are identified measuring up to 3.7 cm. This is unchanged compared with the previous exam. IMPRESSION: 1. Persistent dilated small bowel loops compatible with small bowel obstruction. 2. Enteric contrast is identified within the gastric fundus. Just a small amount of dilute contrast is noted in the expected location of the proximal duodenum. Electronically Signed   By: Waddell Calk M.D.   On: 02/14/2023 11:25   DG Abd Portable 1V-Small Bowel Protocol-Position Verification Result Date: 02/13/2023 CLINICAL DATA:  Check gastric catheter placement EXAM: PORTABLE ABDOMEN - 1 VIEW COMPARISON:  None Available. FINDINGS: Gastric catheter is noted within the stomach. Contrast is noted from recent CT examination. Small-bowel dilatation is seen similar to that noted on recent CT examination. No free air is noted. IMPRESSION: Gastric catheter within the stomach. Persistent small bowel dilatation. Electronically Signed   By: Oneil Devonshire M.D.   On: 02/13/2023 20:24   CT Angio Abd/Pel w/ and/or w/o Result Date: 02/13/2023 CLINICAL DATA:  Mesenteric ischemia. EXAM: CTA ABDOMEN AND PELVIS WITHOUT AND WITH CONTRAST TECHNIQUE: Multidetector CT imaging of the abdomen and pelvis was performed using the standard protocol during bolus administration of intravenous contrast. Multiplanar reconstructed images and MIPs were obtained and reviewed to evaluate the vascular anatomy. RADIATION DOSE REDUCTION: This exam was performed according to the departmental dose-optimization program which includes automated exposure control, adjustment of the mA and/or kV according to patient size and/or use of iterative reconstruction technique. CONTRAST:  OMNIPAQUE  IOHEXOL  350 MG/ML SOLN COMPARISON:  Abdomen and pelvis CT 02/09/2023 FINDINGS: VASCULAR Aorta: Normal caliber aorta without aneurysm, dissection, vasculitis or significant stenosis. Moderate atherosclerotic disease. Celiac: Patent without evidence  of aneurysm, dissection, vasculitis or significant stenosis. SMA: Patent without evidence of aneurysm, dissection, vasculitis or significant stenosis. Renals: Both renal arteries are patent without evidence of aneurysm, dissection, vasculitis, fibromuscular dysplasia or significant stenosis. IMA: Patent without evidence of aneurysm, dissection, vasculitis or significant stenosis. Inflow: Patent without evidence of aneurysm, dissection, vasculitis or significant stenosis. Proximal Outflow: Bilateral common femoral and visualized portions of the superficial and profunda femoral arteries are patent without evidence of aneurysm, dissection, vasculitis or significant stenosis. Veins: Portal vein and superior mesenteric vein are patent. No discernible venous thrombosis within the small bowel mesentery Review of the MIP images confirms the above  findings. NON-VASCULAR Lower chest: Tiny bilateral pleural effusions with dependent atelectasis. Hepatobiliary: No suspicious focal abnormality within the liver parenchyma. Small area of low attenuation in the anterior liver, adjacent to the falciform ligament, is in a characteristic location for focal fatty deposition. Gallbladder is surgically absent. No intrahepatic or extrahepatic biliary dilation. Pancreas: No focal mass lesion. No dilatation of the main duct. No intraparenchymal cyst. No peripancreatic edema. Spleen: No splenomegaly. No suspicious focal mass lesion. Adrenals/Urinary Tract: No adrenal nodule or mass. Tiny well-defined homogeneous low-density lesions in both kidneys are too small to characterize but are statistically most likely benign and probably cysts. No followup imaging is recommended. No evidence for hydroureter. The urinary bladder appears normal for the degree of distention. Stomach/Bowel: Stomach is unremarkable. No gastric wall thickening. No evidence of outlet obstruction. Duodenum is normally positioned as is the ligament of Treitz. 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 On today's exam, there is a fluid collection in the region of the diverticulum with multiple tiny gas bubbles associated. Coronal image 72/14 raises the question of a contained fluid collection, but there is no duodenal wall thickening in these changes are probably all related to the patient's known diverticulum. Small bowel loops have become more diffusely distended in the interval measuring up to 3.2 cm diameter. No discernible small bowel wall thickening and no areas of small-bowel mural non-enhancement are evident. There is no small bowel pneumatosis. Patient is noted to have a completely decompressed distal and terminal ileum with an apparent abrupt transition zone in the right pelvis (see axial 136/5 and coronal 48/14). The appendix is not visible compatible with reported history of appendectomy. No gross colonic mass. No colonic wall thickening. Diverticuli are seen scattered along the entire length of the colon without CT findings of diverticulitis. Lymphatic: There is no gastrohepatic or hepatoduodenal ligament lymphadenopathy. No retroperitoneal or mesenteric lymphadenopathy. No pelvic sidewall lymphadenopathy. Reproductive: Hysterectomy.  There is no adnexal mass. Other: Increasing free fluid is seen in the abdomen and pelvis with mesenteric edema/congestion also progressive since prior. Musculoskeletal: Tiny umbilical hernia contains fat and trace fluid. Gas bubbles in the subcutaneous fat of the right lower anterior abdominal wall likely related to an injection site. No worrisome lytic or sclerotic osseous abnormality. IMPRESSION: 1. No definite CT evidence for mesenteric ischemia. Mesenteric arterial anatomy is patent. No evidence for filling defect within the central mesenteric venous anatomy. No small bowel wall thickening. No small bowel pneumatosis. No areas of small-bowel wall non-enhancement are identified. No intraperitoneal free gas. 2.  Small bowel loops have become more diffusely distended in the interval measuring up to 3.2 cm diameter. Patient is noted to have a completely decompressed distal and terminal ileum with an apparent abrupt transition zone in the right pelvis. This abrupt transition zone corresponds to the abnormality identified on the CT scan from 02-15-2023, but appears more pronounced on today's exam likely due to the increased bowel distension. Imaging features raise concern for small-bowel obstruction, potentially secondary to an adhesion. Internal hernia/closed loop obstruction is not excluded. 3. Increasing free fluid in the abdomen and pelvis with mesenteric edema/congestion also progressive since prior. 4. 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 On today's exam, there is a fluid collection in the region of the diverticulum with multiple tiny gas bubbles associated. The anatomy of the diverticulum is not well demonstrated on today's study and these findings are likely related to the duodenal diverticulum. Contain retroperitoneal duodenal perforation is considered less likely  but not entirely excluded. 5. Tiny bilateral pleural effusions with dependent atelectasis. 6.  Aortic Atherosclerosis (ICD10-I70.0). I discussed these findings by telephone with Dr. Kylie Simmonds at approximately 1425 hours on 02/13/2023. Electronically Signed   By: Camellia Candle M.D.   On: 02/13/2023 14:25        Scheduled Meds:  anastrozole   1 mg Oral Daily   enoxaparin  (LOVENOX ) injection  40 mg Subcutaneous Q24H   lidocaine   1 Application Other Once   pantoprazole  (PROTONIX ) IV  40 mg Intravenous Q12H   pregabalin   75 mg Oral Daily   Continuous Infusions:  lactated ringers  1,000 mL with potassium chloride  20 mEq infusion 75 mL/hr at 02/13/23 2204   piperacillin -tazobactam (ZOSYN )  IV 3.375 g (02/14/23 1227)     LOS: 4 days    Time spent: 35 minutes    Asahd Can A Sidnee Gambrill, MD [facility]   If  7PM-7AM, please contact night-coverage [email or web address]  02/14/2023, 1:21 PM

## 2023-02-15 ENCOUNTER — Inpatient Hospital Stay (HOSPITAL_COMMUNITY): Payer: Medicare Other

## 2023-02-15 ENCOUNTER — Encounter (HOSPITAL_COMMUNITY)
Admission: EM | Disposition: A | Discharge status: Skilled Nursing Facility | Payer: Self-pay | Source: Home / Self Care | Attending: Internal Medicine

## 2023-02-15 ENCOUNTER — Inpatient Hospital Stay (HOSPITAL_COMMUNITY): Payer: Medicare Other | Admitting: Anesthesiology

## 2023-02-15 ENCOUNTER — Encounter (HOSPITAL_COMMUNITY): Payer: Self-pay | Admitting: Internal Medicine

## 2023-02-15 ENCOUNTER — Other Ambulatory Visit: Payer: Self-pay

## 2023-02-15 DIAGNOSIS — K565 Intestinal adhesions [bands], unspecified as to partial versus complete obstruction: Secondary | ICD-10-CM

## 2023-02-15 DIAGNOSIS — K529 Noninfective gastroenteritis and colitis, unspecified: Secondary | ICD-10-CM | POA: Diagnosis not present

## 2023-02-15 HISTORY — PX: LAPAROTOMY: SHX154

## 2023-02-15 HISTORY — PX: BOWEL RESECTION: SHX1257

## 2023-02-15 LAB — CBC
HCT: 39.7 % (ref 36.0–46.0)
Hemoglobin: 13.4 g/dL (ref 12.0–15.0)
MCH: 33.3 pg (ref 26.0–34.0)
MCHC: 33.8 g/dL (ref 30.0–36.0)
MCV: 98.5 fL (ref 80.0–100.0)
Platelets: 327 10*3/uL (ref 150–400)
RBC: 4.03 MIL/uL (ref 3.87–5.11)
RDW: 12.2 % (ref 11.5–15.5)
WBC: 9.5 10*3/uL (ref 4.0–10.5)
nRBC: 0 % (ref 0.0–0.2)

## 2023-02-15 LAB — BASIC METABOLIC PANEL
Anion gap: 12 (ref 5–15)
BUN: 24 mg/dL — ABNORMAL HIGH (ref 8–23)
CO2: 26 mmol/L (ref 22–32)
Calcium: 9.3 mg/dL (ref 8.9–10.3)
Chloride: 99 mmol/L (ref 98–111)
Creatinine, Ser: 0.76 mg/dL (ref 0.44–1.00)
GFR, Estimated: >60 mL/min (ref >60)
Glucose, Bld: 97 mg/dL (ref 70–99)
Potassium: 3.7 mmol/L (ref 3.5–5.1)
Sodium: 137 mmol/L (ref 135–145)

## 2023-02-15 LAB — TYPE AND SCREEN
ABO/RH(D): A POS
Antibody Screen: NEGATIVE

## 2023-02-15 LAB — ABO/RH: ABO/RH(D): A POS

## 2023-02-15 SURGERY — LAPAROTOMY, EXPLORATORY
Anesthesia: General

## 2023-02-15 MED ORDER — ONDANSETRON HCL 4 MG/2ML IJ SOLN: 4.0000 mg | Freq: Four times a day (QID) | INTRAMUSCULAR | Status: DC | PRN

## 2023-02-15 MED ORDER — ONDANSETRON HCL 4 MG/2ML IJ SOLN
INTRAMUSCULAR | Status: AC
  Filled 2023-02-15: qty 2

## 2023-02-15 MED ORDER — METHOCARBAMOL 1000 MG/10ML IJ SOLN: 500.0000 mg | Freq: Four times a day (QID) | INTRAMUSCULAR | Status: DC | PRN

## 2023-02-15 MED ORDER — ACETAMINOPHEN 10 MG/ML IV SOLN
1000.0000 mg | Freq: Four times a day (QID) | INTRAVENOUS | Status: AC
  Administered 2023-02-15 – 2023-02-16 (×4): 1000 mg via INTRAVENOUS
  Filled 2023-02-15 (×4): qty 100

## 2023-02-15 MED ORDER — LIDOCAINE 2% (20 MG/ML) 5 ML SYRINGE
INTRAMUSCULAR | Status: DC | PRN
  Administered 2023-02-15: 80 mg via INTRAVENOUS

## 2023-02-15 MED ORDER — HYDROMORPHONE HCL 1 MG/ML IJ SOLN
INTRAMUSCULAR | Status: DC | PRN
  Administered 2023-02-15: .2 mg via INTRAVENOUS
  Administered 2023-02-15: .4 mg via INTRAVENOUS

## 2023-02-15 MED ORDER — SUCCINYLCHOLINE CHLORIDE 200 MG/10ML IV SOSY
PREFILLED_SYRINGE | INTRAVENOUS | Status: DC | PRN
  Administered 2023-02-15: 140 mg via INTRAVENOUS

## 2023-02-15 MED ORDER — FENTANYL CITRATE (PF) 100 MCG/2ML IJ SOLN
INTRAMUSCULAR | Status: DC | PRN
  Administered 2023-02-15: 25 ug via INTRAVENOUS
  Administered 2023-02-15: 75 ug via INTRAVENOUS

## 2023-02-15 MED ORDER — LIDOCAINE HCL (PF) 2 % IJ SOLN
INTRAMUSCULAR | Status: AC
  Filled 2023-02-15: qty 5

## 2023-02-15 MED ORDER — LACTATED RINGERS IV SOLN: INTRAVENOUS | Status: AC

## 2023-02-15 MED ORDER — HYDROMORPHONE HCL 1 MG/ML IJ SOLN
0.5000 mg | INTRAMUSCULAR | Status: DC | PRN
  Administered 2023-02-15 – 2023-02-18 (×3): 0.5 mg via INTRAVENOUS
  Filled 2023-02-15 (×4): qty 0.5

## 2023-02-15 MED ORDER — PIPERACILLIN-TAZOBACTAM 3.375 G IVPB
INTRAVENOUS | Status: AC
  Filled 2023-02-15: qty 50

## 2023-02-15 MED ORDER — 0.9 % SODIUM CHLORIDE (POUR BTL) OPTIME
TOPICAL | Status: DC | PRN
  Administered 2023-02-15: 2000 mL

## 2023-02-15 MED ORDER — ONDANSETRON HCL 4 MG/2ML IJ SOLN
INTRAMUSCULAR | Status: DC | PRN
  Administered 2023-02-15: 4 mg via INTRAVENOUS

## 2023-02-15 MED ORDER — PROPOFOL 10 MG/ML IV BOLUS
INTRAVENOUS | Status: AC
  Filled 2023-02-15: qty 20

## 2023-02-15 MED ORDER — SODIUM CHLORIDE (PF) 0.9 % IJ SOLN
INTRAMUSCULAR | Status: AC
  Filled 2023-02-15: qty 10

## 2023-02-15 MED ORDER — DEXAMETHASONE SODIUM PHOSPHATE 10 MG/ML IJ SOLN
INTRAMUSCULAR | Status: AC
Start: 2023-02-15 — End: ?
  Filled 2023-02-15: qty 1

## 2023-02-15 MED ORDER — CEFAZOLIN SODIUM-DEXTROSE 2-4 GM/100ML-% IV SOLN
2.0000 g | INTRAVENOUS | Status: AC
  Administered 2023-02-15: 2 g via INTRAVENOUS
  Filled 2023-02-15: qty 100

## 2023-02-15 MED ORDER — FENTANYL CITRATE (PF) 100 MCG/2ML IJ SOLN
INTRAMUSCULAR | Status: AC
  Filled 2023-02-15: qty 2

## 2023-02-15 MED ORDER — ALBUMIN HUMAN 5 % IV SOLN
INTRAVENOUS | Status: AC
  Filled 2023-02-15: qty 250

## 2023-02-15 MED ORDER — ROCURONIUM BROMIDE 10 MG/ML (PF) SYRINGE
PREFILLED_SYRINGE | INTRAVENOUS | Status: AC
  Filled 2023-02-15: qty 10

## 2023-02-15 MED ORDER — PROPOFOL 10 MG/ML IV BOLUS
INTRAVENOUS | Status: DC | PRN
  Administered 2023-02-15: 100 mg via INTRAVENOUS

## 2023-02-15 MED ORDER — ALBUMIN HUMAN 5 % IV SOLN: INTRAVENOUS | Status: DC | PRN

## 2023-02-15 MED ORDER — HYDROMORPHONE HCL 2 MG/ML IJ SOLN
INTRAMUSCULAR | Status: AC
  Filled 2023-02-15: qty 1

## 2023-02-15 MED ORDER — SUCCINYLCHOLINE CHLORIDE 200 MG/10ML IV SOSY
PREFILLED_SYRINGE | INTRAVENOUS | Status: AC
  Filled 2023-02-15: qty 10

## 2023-02-15 MED ORDER — MIDAZOLAM HCL 2 MG/2ML IJ SOLN
INTRAMUSCULAR | Status: AC
  Filled 2023-02-15: qty 2

## 2023-02-15 MED ORDER — ROCURONIUM BROMIDE 10 MG/ML (PF) SYRINGE
PREFILLED_SYRINGE | INTRAVENOUS | Status: DC | PRN
  Administered 2023-02-15: 10 mg via INTRAVENOUS
  Administered 2023-02-15: 30 mg via INTRAVENOUS

## 2023-02-15 MED ORDER — SUGAMMADEX SODIUM 200 MG/2ML IV SOLN
INTRAVENOUS | Status: DC | PRN
  Administered 2023-02-15: 120 mg via INTRAVENOUS

## 2023-02-15 MED ORDER — DEXAMETHASONE SODIUM PHOSPHATE 10 MG/ML IJ SOLN
INTRAMUSCULAR | Status: DC | PRN
  Administered 2023-02-15: 5 mg via INTRAVENOUS

## 2023-02-15 MED ORDER — LACTATED RINGERS IV SOLN: INTRAVENOUS | Status: DC

## 2023-02-15 SURGICAL SUPPLY — 34 items
BAG COUNTER SPONGE SURGICOUNT (BAG) IMPLANT
BENZOIN TINCTURE PRP APPL 2/3 (GAUZE/BANDAGES/DRESSINGS) IMPLANT
CHLORAPREP W/TINT 26 (MISCELLANEOUS) IMPLANT
COVER MAYO STAND STRL (DRAPES) ×2 IMPLANT
COVER SURGICAL LIGHT HANDLE (MISCELLANEOUS) ×2 IMPLANT
DRAIN CHANNEL 19F RND (DRAIN) IMPLANT
DRAPE LAPAROSCOPIC ABDOMINAL (DRAPES) ×2 IMPLANT
DRSG OPSITE POSTOP 4X10 (GAUZE/BANDAGES/DRESSINGS) IMPLANT
DRSG OPSITE POSTOP 4X6 (GAUZE/BANDAGES/DRESSINGS) IMPLANT
DRSG OPSITE POSTOP 4X8 (GAUZE/BANDAGES/DRESSINGS) IMPLANT
ELECT REM PT RETURN 15FT ADLT (MISCELLANEOUS) ×2 IMPLANT
EVACUATOR SILICONE 100CC (DRAIN) IMPLANT
GAUZE SPONGE 4X4 12PLY STRL (GAUZE/BANDAGES/DRESSINGS) IMPLANT
GLOVE BIO SURGEON STRL SZ 6 (GLOVE) ×4 IMPLANT
GLOVE INDICATOR 6.5 STRL GRN (GLOVE) ×4 IMPLANT
GLOVE SS BIOGEL STRL SZ 6 (GLOVE) ×2 IMPLANT
GOWN STRL REUS W/ TWL LRG LVL3 (GOWN DISPOSABLE) ×2 IMPLANT
GOWN STRL REUS W/ TWL XL LVL3 (GOWN DISPOSABLE) IMPLANT
KIT TURNOVER KIT A (KITS) IMPLANT
PACK GENERAL/GYN (CUSTOM PROCEDURE TRAY) ×2 IMPLANT
STAPLER SKIN PROX WIDE 3.9 (STAPLE) IMPLANT
SUT ETHILON 3 0 PS 1 (SUTURE) IMPLANT
SUT MNCRL AB 4-0 PS2 18 (SUTURE) IMPLANT
SUT NOVA T20/GS 25 (SUTURE) IMPLANT
SUT PDS AB 1 TP1 96 (SUTURE) IMPLANT
SUT SILK 2 0 SH CR/8 (SUTURE) ×2 IMPLANT
SUT SILK 2-0 18XBRD TIE 12 (SUTURE) ×2 IMPLANT
SUT SILK 3 0 SH CR/8 (SUTURE) ×2 IMPLANT
SUT SILK 3-0 18XBRD TIE 12 (SUTURE) ×2 IMPLANT
SUT VIC AB 3-0 SH 27X BRD (SUTURE) IMPLANT
TAPE STRIPS DRAPE STRL (GAUZE/BANDAGES/DRESSINGS) IMPLANT
TOWEL OR 17X26 10 PK STRL BLUE (TOWEL DISPOSABLE) IMPLANT
TRAY FOLEY MTR SLVR 14FR STAT (SET/KITS/TRAYS/PACK) ×2 IMPLANT
TRAY FOLEY MTR SLVR 16FR STAT (SET/KITS/TRAYS/PACK) ×2 IMPLANT

## 2023-02-15 NOTE — Progress Notes (Signed)
 PROGRESS NOTE    Candice Brown  FMW:[id number] DOB: 21-Jan-1932 DOA: 02/09/2023 PCP: Shepard Ade, MD   Brief Narrative: 89 year old PMH significant for hyperlipidemia, GERD, presented to the ED presented to hospital with abdominal pain, nausea vomiting and constipation for the last 24 hours.  She has a past medical history of hysterectomy, appendectomy and cholecystectomy.  CT abdomen and pelvis concern for short segment abdominal small bowel right lower quadrant regional inflammatory  edematous changes in the mesentery a small volume ascites.  Consideration for localized infectious, inflammatory or ischemic enteritis.  Patient was started on IV Zosyn  and was admitted to hospital for further evaluation and treatment.  During hospitalization, patient failed to improve repeated CT abdomen and pelvis 1/1 showed finding consistent with SBO.  General surgery consulted.  NG tube placed, small bowel protocol was started.  Assessment & Plan:   Principal Problem:   Enteritis Active Problems:   Malignant neoplasm of upper-outer quadrant of right breast in female, estrogen receptor positive (HCC)   HLD (hyperlipidemia)   GERD (gastroesophageal reflux disease)   Gastroenteritis  Acute enteritis, SBO: Initially CT of the abdomen finding concerning for enteritis.  She was initially treated conservatively with IV fluids antibiotic but unimproved so repeat CT scan was done which showed small bowel obstruction.  Currently on IV  Protonix  IV fluids.  General surgery on board and currently n.p.o. with NG tube placement since 02/13/2023.  If no improvement might need surgical intervention.  No need for antibiotic so we will discontinue.  Patient did have a bowel movement today but still feels the same with her symptoms.  Will update general surgery.  Hyperlipidemia Continue Crestor .  History of breast cancer Will follow-up as outpatient.  GERD: PPI  Hyponatremia: Latest sodium 137.  Improved with IV  fluids.  Hypokalemia Improved.  Latest potassium 3.7.   DVT prophylaxis: Lovenox   Code Status: Full code  Family Communication:  Spoke with the patient's daughter at bedside at length.  Disposition Plan:   Status is: Inpatient Remains inpatient appropriate because: management of Enteritis small bowel obstruction    Consultants:  General surgery  Procedures:  NG tube placement  Antimicrobials:  None  Subjective: Today, patient was seen and examined at bedside.  Patient feels that that she had a bowel movement.  She still feels distended with some discomfort.  Has been belching but no flatus.  No nausea or vomiting.  No fever chills shortness of breath.   Objective: Vitals:   02/14/23 1405 02/14/23 1932 02/15/23 0413 02/15/23 0433  BP: (!) 162/71 (!) 146/72 (!) 160/64 (!) 155/79  Pulse:  83 81 80  Resp:  15 16   Temp:  98 F (36.7 C) 98.4 F (36.9 C)   TempSrc:  Oral    SpO2:  94% (!) 89% 96%  Weight:      Height:        Intake/Output Summary (Last 24 hours) at 02/15/2023 0839 Last data filed at 02/15/2023 0751 Gross per 24 hour  Intake 693.88 ml  Output 1201 ml  Net -507.12 ml   Filed Weights   02/09/23 1111  Weight: 53.5 kg    Physical examination:  General:  Average built, not in obvious distress, elderly female, Communicative HENT:   No scleral pallor or icterus noted. Oral mucosa is moist.,  NG tube in place with bilious fluid. Chest:   Diminished breath sounds bilaterally. No crackles or wheezes.  CVS: S1 &S2 heard. No murmur.  Regular rate and  rhythm. Abdomen:  distended, nonspecific mild tenderness noted bowel sounds are heard.   Extremities: No cyanosis, clubbing or edema.  Peripheral pulses are palpable. Psych: Alert, awake and oriented, normal mood CNS:  No cranial nerve deficits.  Power equal in all extremities.   Skin: Warm and dry.  No rashes noted.  Data Reviewed: I have personally reviewed following labs and imaging  studies  CBC: Recent Labs  Lab 02/10/23 0507 02/11/23 0503 02/12/23 0523 02/14/23 0534 02/15/23 0538  WBC 9.9 5.4 8.1 9.2 9.5  HGB 13.5 13.4 14.0 13.6 13.4  HCT 42.8 41.7 42.9 41.5 39.7  MCV 102.1* 103.0* 100.0 101.0* 98.5  PLT 272 280 315 298 327   Basic Metabolic Panel: Recent Labs  Lab 02/11/23 0503 02/12/23 0523 02/13/23 0532 02/14/23 0534 02/15/23 0538  NA 134* 129* 133* 132* 137  K 4.7 3.5 3.4* 4.1 3.7  CL 100 93* 97* 99 99  CO2 27 24 27 22 26   GLUCOSE 130* 144* 98 97 97  BUN 22 21 22 21  24*  CREATININE 1.05* 0.89 0.80 0.69 0.76  CALCIUM  8.8* 8.7* 8.9 8.9 9.3  MG  --   --   --  2.2  --    GFR: Estimated Creatinine Clearance: 39.4 mL/min (by C-G formula based on SCr of 0.76 mg/dL). Liver Function Tests: Recent Labs  Lab 02/09/23 1135  AST 29  ALT 18  ALKPHOS 45  BILITOT 0.7  PROT 7.3  ALBUMIN  4.2   Recent Labs  Lab 02/09/23 1135  LIPASE 88*   No results for input(s): AMMONIA in the last 168 hours. Coagulation Profile: No results for input(s): INR, PROTIME in the last 168 hours. Cardiac Enzymes: No results for input(s): CKTOTAL, CKMB, CKMBINDEX, TROPONINI in the last 168 hours. BNP (last 3 results) No results for input(s): PROBNP in the last 8760 hours. HbA1C: No results for input(s): HGBA1C in the last 72 hours. CBG: No results for input(s): GLUCAP in the last 168 hours. Lipid Profile: No results for input(s): CHOL, HDL, LDLCALC, TRIG, CHOLHDL, LDLDIRECT in the last 72 hours. Thyroid  Function Tests: No results for input(s): TSH, T4TOTAL, FREET4, T3FREE, THYROIDAB in the last 72 hours. Anemia Panel: Recent Labs    02/14/23 0534  VITAMINB12 354   Sepsis Labs: Recent Labs  Lab 02/09/23 1648 02/09/23 1956  LATICACIDVEN 0.8 0.9    Recent Results (from the past 240 hours)  Blood culture (routine x 2)     Status: None   Collection Time: 02/09/23  5:30 PM   Specimen: BLOOD  Result Value Ref  Range Status   Specimen Description   Final    BLOOD LEFT ANTECUBITAL Performed at [facility], [address] [place], [place] [address]    Special Requests   Final    BOTTLES DRAWN AEROBIC AND ANAEROBIC Blood Culture adequate volume Performed at [facility], [address], [place], [place] [address]    Culture   Final    NO GROWTH 5 DAYS Performed at [facility], [address], [place], [place] [address]    Report Status 02/14/2023 FINAL  Final  Blood culture (routine x 2)     Status: None   Collection Time: 02/09/23  5:45 PM   Specimen: BLOOD  Result Value Ref Range Status   Specimen Description   Final    BLOOD LEFT ANTECUBITAL Performed at [facility], [address], [place], [place] [address]    Special Requests  Final    BOTTLES DRAWN AEROBIC AND ANAEROBIC Blood Culture results may not be optimal due to an inadequate volume of blood received in culture bottles Performed at [facility], [address] [place], [place] [address]    Culture   Final    NO GROWTH 5 DAYS Performed at [facility], [address], [place], [place] [address]    Report Status 02/14/2023 FINAL  Final  MRSA Next Gen by PCR, Nasal     Status: None   Collection Time: 02/09/23  9:06 PM   Specimen: Nasal Mucosa; Nasal Swab  Result Value Ref Range Status   MRSA by PCR Next Gen NOT DETECTED NOT DETECTED Final    Comment: (NOTE) The GeneXpert MRSA Assay (FDA approved for NASAL specimens only), is one component of a comprehensive MRSA colonization surveillance program. It is not intended to diagnose MRSA infection nor to guide or monitor treatment for MRSA infections. Test performance is not FDA approved in patients less than 78 years old. Performed at [facility], [address] [place], [place] [address]      Radiology Studies: DG Abd Portable 1V Result Date:  02/15/2023 CLINICAL DATA:  Small bowel obstruction EXAM: PORTABLE ABDOMEN - 1 VIEW COMPARISON:  Yesterday FINDINGS: Enteric tube tip and side port at the stomach. Continued dilatation of small bowel with gas filling. Some contrast has reached the proximal colon. IMPRESSION: Some contrast has reached the colon but there is ongoing small bowel obstruction with unchanged diffuse gaseous distension of small bowel. Electronically Signed   By: Dorn Roulette M.D.   On: 02/15/2023 08:04   DG Abd Portable 1V Result Date: 02/14/2023 CLINICAL DATA:  Small-bowel obstruction EXAM: PORTABLE ABDOMEN - 1 VIEW COMPARISON:  02/14/2023 FINDINGS: Supine frontal view of the abdomen and pelvis excludes the right hemidiaphragm by collimation. Enteric catheter tip within the gastric fundus. Oral contrast remains within the stomach. Minimal dilute contrast is seen within dilated loops of small bowel, consistent with small-bowel obstruction. There is no oral contrast seen within the expected region of the colon. IMPRESSION: 1. Minimal progression of oral contrast, with dilute contrast seen within dilated loops of small bowel throughout the abdomen, consistent with small-bowel obstruction. No transit of contrast into the colon by the time of imaging. Electronically Signed   By: Ozell Daring M.D.   On: 02/14/2023 21:51   DG Abd Portable 1V-Small Bowel Obstruction Protocol-initial, 8 hr delay Result Date: 02/14/2023 CLINICAL DATA:  Follow-up exam for small bowel obstruction. EXAM: PORTABLE ABDOMEN - 1 VIEW COMPARISON:  02/13/2023 FINDINGS: Enteric tube tip is scratch set enteric tube is again noted with tip and side port in the stomach. Enteric contrast material opacifies the gastric fundus. Only a small amount of dilute enteric contrast material is noted in the expected location of the proximal duodenum. No significant antegrade progression of the contrast material into the small bowel noted. Persistent dilated small bowel loops are  identified measuring up to 3.7 cm. This is unchanged compared with the previous exam. IMPRESSION: 1. Persistent dilated small bowel loops compatible with small bowel obstruction. 2. Enteric contrast is identified within the gastric fundus. Just a small amount of dilute contrast is noted in the expected location of the proximal duodenum. Electronically Signed   By: Waddell Calk M.D.   On: 02/14/2023 11:25   DG Abd Portable 1V-Small Bowel Protocol-Position Verification Result Date: 02/13/2023 CLINICAL DATA:  Check gastric catheter placement EXAM: PORTABLE ABDOMEN - 1 VIEW  COMPARISON:  None Available. FINDINGS: Gastric catheter is noted within the stomach. Contrast is noted from recent CT examination. Small-bowel dilatation is seen similar to that noted on recent CT examination. No free air is noted. IMPRESSION: Gastric catheter within the stomach. Persistent small bowel dilatation. Electronically Signed   By: Oneil Devonshire M.D.   On: 02/13/2023 20:24   CT Angio Abd/Pel w/ and/or w/o Result Date: 02/13/2023 CLINICAL DATA:  Mesenteric ischemia. EXAM: CTA ABDOMEN AND PELVIS WITHOUT AND WITH CONTRAST TECHNIQUE: Multidetector CT imaging of the abdomen and pelvis was performed using the standard protocol during bolus administration of intravenous contrast. Multiplanar reconstructed images and MIPs were obtained and reviewed to evaluate the vascular anatomy. RADIATION DOSE REDUCTION: This exam was performed according to the departmental dose-optimization program which includes automated exposure control, adjustment of the mA and/or kV according to patient size and/or use of iterative reconstruction technique. CONTRAST:  OMNIPAQUE  IOHEXOL  350 MG/ML SOLN COMPARISON:  Abdomen and pelvis CT 02/09/2023 FINDINGS: VASCULAR Aorta: Normal caliber aorta without aneurysm, dissection, vasculitis or significant stenosis. Moderate atherosclerotic disease. Celiac: Patent without evidence of aneurysm, dissection, vasculitis or  significant stenosis. SMA: Patent without evidence of aneurysm, dissection, vasculitis or significant stenosis. Renals: Both renal arteries are patent without evidence of aneurysm, dissection, vasculitis, fibromuscular dysplasia or significant stenosis. IMA: Patent without evidence of aneurysm, dissection, vasculitis or significant stenosis. Inflow: Patent without evidence of aneurysm, dissection, vasculitis or significant stenosis. Proximal Outflow: Bilateral common femoral and visualized portions of the superficial and profunda femoral arteries are patent without evidence of aneurysm, dissection, vasculitis or significant stenosis. Veins: Portal vein and superior mesenteric vein are patent. No discernible venous thrombosis within the small bowel mesentery Review of the MIP images confirms the above findings. NON-VASCULAR Lower chest: Tiny bilateral pleural effusions with dependent atelectasis. Hepatobiliary: No suspicious focal abnormality within the liver parenchyma. Small area of low attenuation in the anterior liver, adjacent to the falciform ligament, is in a characteristic location for focal fatty deposition. Gallbladder is surgically absent. No intrahepatic or extrahepatic biliary dilation. Pancreas: No focal mass lesion. No dilatation of the main duct. No intraparenchymal cyst. No peripancreatic edema. Spleen: No splenomegaly. No suspicious focal mass lesion. Adrenals/Urinary Tract: No adrenal nodule or mass. Tiny well-defined homogeneous low-density lesions in both kidneys are too small to characterize but are statistically most likely benign and probably cysts. No followup imaging is recommended. No evidence for hydroureter. The urinary bladder appears normal for the degree of distention. Stomach/Bowel: Stomach is unremarkable. No gastric wall thickening. No evidence of outlet obstruction. Duodenum is normally positioned as is the ligament of Treitz. 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 On today's exam, there is a fluid collection in the region of the diverticulum with multiple tiny gas bubbles associated. Coronal image 72/14 raises the question of a contained fluid collection, but there is no duodenal wall thickening in these changes are probably all related to the patient's known diverticulum. Small bowel loops have become more diffusely distended in the interval measuring up to 3.2 cm diameter. No discernible small bowel wall thickening and no areas of small-bowel mural non-enhancement are evident. There is no small bowel pneumatosis. Patient is noted to have a completely decompressed distal and terminal ileum with an apparent abrupt transition zone in the right pelvis (see axial 136/5 and coronal 48/14). The appendix is not visible compatible with reported history of appendectomy. No gross colonic mass. No colonic wall thickening. Diverticuli are seen scattered along the entire  length of the colon without CT findings of diverticulitis. Lymphatic: There is no gastrohepatic or hepatoduodenal ligament lymphadenopathy. No retroperitoneal or mesenteric lymphadenopathy. No pelvic sidewall lymphadenopathy. Reproductive: Hysterectomy.  There is no adnexal mass. Other: Increasing free fluid is seen in the abdomen and pelvis with mesenteric edema/congestion also progressive since prior. Musculoskeletal: Tiny umbilical hernia contains fat and trace fluid. Gas bubbles in the subcutaneous fat of the right lower anterior abdominal wall likely related to an injection site. No worrisome lytic or sclerotic osseous abnormality. IMPRESSION: 1. No definite CT evidence for mesenteric ischemia. Mesenteric arterial anatomy is patent. No evidence for filling defect within the central mesenteric venous anatomy. No small bowel wall thickening. No small bowel pneumatosis. No areas of small-bowel wall non-enhancement are identified. No intraperitoneal free gas. 2. Small bowel loops have become more  diffusely distended in the interval measuring up to 3.2 cm diameter. Patient is noted to have a completely decompressed distal and terminal ileum with an apparent abrupt transition zone in the right pelvis. This abrupt transition zone corresponds to the abnormality identified on the CT scan from 02/12/2023, but appears more pronounced on today's exam likely due to the increased bowel distension. Imaging features raise concern for small-bowel obstruction, potentially secondary to an adhesion. Internal hernia/closed loop obstruction is not excluded. 3. Increasing free fluid in the abdomen and pelvis with mesenteric edema/congestion also progressive since prior. 4. 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 On today's exam, there is a fluid collection in the region of the diverticulum with multiple tiny gas bubbles associated. The anatomy of the diverticulum is not well demonstrated on today's study and these findings are likely related to the duodenal diverticulum. Contain retroperitoneal duodenal perforation is considered less likely but not entirely excluded. 5. Tiny bilateral pleural effusions with dependent atelectasis. 6.  Aortic Atherosclerosis (ICD10-I70.0). I discussed these findings by telephone with Dr. Regalado at approximately 1425 hours on 02/13/2023. Electronically Signed   By: Camellia Candle M.D.   On: 02/13/2023 14:25    Scheduled Meds:  anastrozole   1 mg Oral Daily   enoxaparin  (LOVENOX ) injection  40 mg Subcutaneous Q24H   lidocaine   1 Application Other Once   pantoprazole  (PROTONIX ) IV  40 mg Intravenous Q12H   pregabalin   75 mg Oral Daily   Continuous Infusions:  lactated ringers  75 mL/hr at 02/15/23 0043   piperacillin -tazobactam (ZOSYN )  IV 3.375 g (02/15/23 0351)     LOS: 5 days    Vernal Alstrom, MD [facility]  If 7PM-7AM, please contact night-coverage [email or web address]  02/15/2023, 8:39 AM

## 2023-02-15 NOTE — Progress Notes (Signed)
 1900: received pt with NG tube to left nares to low intermittent suction in place. Pt is sleepy, easily arousable. A&O x 4. VSS, on room air. Denies nausea. Pt is NPO with ice chips and pt aware. Pt uses bedside commode.  Educated on falls and safety precautions, aspiration precautions, call bell, plan of care, pt verbalizes understanding. Bed alarm on. Call bell in reach.  Safety maintained. Will continue to monitor.

## 2023-02-15 NOTE — Anesthesia Preprocedure Evaluation (Signed)
 Anesthesia Evaluation  Patient identified by MRN, date of birth, ID band Patient awake    Reviewed: Allergy & Precautions, H&P , NPO status , Patient's Chart, lab work & pertinent test results  History of Anesthesia Complications (+) PROLONGED EMERGENCE and history of anesthetic complications  Airway Mallampati: II  TM Distance: >3 FB Neck ROM: Full    Dental no notable dental hx. (+) Dental Advisory Given, Teeth Intact   Pulmonary neg pulmonary ROS, Current Smoker   Pulmonary exam normal breath sounds clear to auscultation       Cardiovascular Exercise Tolerance: Good negative cardio ROS Normal cardiovascular exam Rhythm:Regular Rate:Normal     Neuro/Psych  Headaches PSYCHIATRIC DISORDERS  Depression     Neuromuscular disease    GI/Hepatic Neg liver ROS,GERD  ,,  Endo/Other  negative endocrine ROS    Renal/GU negative Renal ROS  negative genitourinary   Musculoskeletal  (+) Arthritis ,    Abdominal   Peds  Hematology  (+) Blood dyscrasia, anemia   Anesthesia Other Findings   Reproductive/Obstetrics negative OB ROS                             Anesthesia Physical Anesthesia Plan  ASA: 3 and emergent  Anesthesia Plan: General   Post-op Pain Management: Dilaudid  IV and Precedex   Induction: Intravenous, Cricoid pressure planned and Rapid sequence  PONV Risk Score and Plan: 3 and Ondansetron , Dexamethasone  and Treatment may vary due to age or medical condition  Airway Management Planned: Oral ETT  Additional Equipment: None  Intra-op Plan:   Post-operative Plan: Extubation in OR  Informed Consent: I have reviewed the patients History and Physical, chart, labs and discussed the procedure including the risks, benefits and alternatives for the proposed anesthesia with the patient or authorized representative who has indicated his/her understanding and acceptance.     Dental  advisory given  Plan Discussed with: CRNA and Anesthesiologist  Anesthesia Plan Comments:         Anesthesia Quick Evaluation

## 2023-02-15 NOTE — Anesthesia Postprocedure Evaluation (Signed)
 Anesthesia Post Note  Patient: Candice Brown  Procedure(s) Performed: EXPLORATORY LAPAROTOMY LYSIS OF ADHESIONS     Patient location during evaluation: PACU Anesthesia Type: General Level of consciousness: awake and alert Pain management: pain level controlled Vital Signs Assessment: post-procedure vital signs reviewed and stable Respiratory status: spontaneous breathing, nonlabored ventilation, respiratory function stable and patient connected to nasal cannula oxygen Cardiovascular status: blood pressure returned to baseline and stable Postop Assessment: no apparent nausea or vomiting Anesthetic complications: no   No notable events documented.  Last Vitals:  Vitals:   02/15/23 1445 02/15/23 1520  BP: (!) 171/71 (!) 163/73  Pulse: 73 79  Resp: 12   Temp:  36.6 C  SpO2: 95% 92%    Last Pain:  Vitals:   02/15/23 1607  TempSrc:   PainSc: 4                  Johnaton Sonneborn

## 2023-02-15 NOTE — Transfer of Care (Signed)
 Immediate Anesthesia Transfer of Care Note  Patient: Candice Brown  Procedure(s) Performed: EXPLORATORY LAPAROTOMY LYSIS OF ADHESIONS  Patient Location: PACU  Anesthesia Type:General  Level of Consciousness: drowsy  Airway & Oxygen Therapy: Patient Spontanous Breathing and Patient connected to face mask oxygen  Post-op Assessment: Report given to RN and Post -op Vital signs reviewed and stable  Post vital signs: Reviewed and stable  Last Vitals:  Vitals Value Taken Time  BP    Temp    Pulse 78 02/15/23 1407  Resp 12 02/15/23 1407  SpO2 100 % 02/15/23 1407  Vitals shown include unfiled device data.  Last Pain:  Vitals:   02/15/23 1218  TempSrc: Oral  PainSc:       Patients Stated Pain Goal: 0 (02/11/23 2100)  Complications: No notable events documented.

## 2023-02-15 NOTE — Progress Notes (Addendum)
 Subjective/Chief Complaint: No flatus or BM.  Still with small amount of abdominal discomfort and bloating.  800cc of NGT output yesterday  Objective: Vital signs in last 24 hours: Temp:  [97.5 F (36.4 C)-98.4 F (36.9 C)] 98.4 F (36.9 C) (01/03 0413) Pulse Rate:  [69-83] 80 (01/03 0433) Resp:  [15-18] 16 (01/03 0413) BP: (146-174)/(64-79) 155/79 (01/03 0433) SpO2:  [89 %-96 %] 96 % (01/03 0433) Last BM Date : 02/12/23  Intake/Output from previous day: 01/02 0701 - 01/03 0700 In: 693.9 [I.V.:392.9; IV Piggyback:301] Out: 1200 [Emesis/NG output:800] Intake/Output this shift: Total I/O In: -  Out: 1 [Urine:1]  WDWN in NAD Abd - mildly distended, minimally tender in central abdomen, NGT with bilious output  Lab Results:  Recent Labs    02/14/23 0534 02/15/23 0538  WBC 9.2 9.5  HGB 13.6 13.4  HCT 41.5 39.7  PLT 298 327   BMET Recent Labs    02/14/23 0534 02/15/23 0538  NA 132* 137  K 4.1 3.7  CL 99 99  CO2 22 26  GLUCOSE 97 97  BUN 21 24*  CREATININE 0.69 0.76  CALCIUM  8.9 9.3     Studies/Results: DG Abd Portable 1V Result Date: 02/15/2023 CLINICAL DATA:  Small bowel obstruction EXAM: PORTABLE ABDOMEN - 1 VIEW COMPARISON:  Yesterday FINDINGS: Enteric tube tip and side port at the stomach. Continued dilatation of small bowel with gas filling. Some contrast has reached the proximal colon. IMPRESSION: Some contrast has reached the colon but there is ongoing small bowel obstruction with unchanged diffuse gaseous distension of small bowel. Electronically Signed   By: Dorn Roulette M.D.   On: 02/15/2023 08:04   DG Abd Portable 1V Result Date: 02/14/2023 CLINICAL DATA:  Small-bowel obstruction EXAM: PORTABLE ABDOMEN - 1 VIEW COMPARISON:  02/14/2023 FINDINGS: Supine frontal view of the abdomen and pelvis excludes the right hemidiaphragm by collimation. Enteric catheter tip within the gastric fundus. Oral contrast remains within the stomach. Minimal dilute  contrast is seen within dilated loops of small bowel, consistent with small-bowel obstruction. There is no oral contrast seen within the expected region of the colon. IMPRESSION: 1. Minimal progression of oral contrast, with dilute contrast seen within dilated loops of small bowel throughout the abdomen, consistent with small-bowel obstruction. No transit of contrast into the colon by the time of imaging. Electronically Signed   By: Ozell Daring M.D.   On: 02/14/2023 21:51   DG Abd Portable 1V-Small Bowel Obstruction Protocol-initial, 8 hr delay Result Date: 02/14/2023 CLINICAL DATA:  Follow-up exam for small bowel obstruction. EXAM: PORTABLE ABDOMEN - 1 VIEW COMPARISON:  02/13/2023 FINDINGS: Enteric tube tip is scratch set enteric tube is again noted with tip and side port in the stomach. Enteric contrast material opacifies the gastric fundus. Only a small amount of dilute enteric contrast material is noted in the expected location of the proximal duodenum. No significant antegrade progression of the contrast material into the small bowel noted. Persistent dilated small bowel loops are identified measuring up to 3.7 cm. This is unchanged compared with the previous exam. IMPRESSION: 1. Persistent dilated small bowel loops compatible with small bowel obstruction. 2. Enteric contrast is identified within the gastric fundus. Just a small amount of dilute contrast is noted in the expected location of the proximal duodenum. Electronically Signed   By: Waddell Calk M.D.   On: 02/14/2023 11:25   DG Abd Portable 1V-Small Bowel Protocol-Position Verification Result Date: 02/13/2023 CLINICAL DATA:  Check gastric catheter placement EXAM:  PORTABLE ABDOMEN - 1 VIEW COMPARISON:  None Available. FINDINGS: Gastric catheter is noted within the stomach. Contrast is noted from recent CT examination. Small-bowel dilatation is seen similar to that noted on recent CT examination. No free air is noted. IMPRESSION: Gastric catheter  within the stomach. Persistent small bowel dilatation. Electronically Signed   By: Oneil Devonshire M.D.   On: 02/13/2023 20:24   CT Angio Abd/Pel w/ and/or w/o Result Date: 02/13/2023 CLINICAL DATA:  Mesenteric ischemia. EXAM: CTA ABDOMEN AND PELVIS WITHOUT AND WITH CONTRAST TECHNIQUE: Multidetector CT imaging of the abdomen and pelvis was performed using the standard protocol during bolus administration of intravenous contrast. Multiplanar reconstructed images and MIPs were obtained and reviewed to evaluate the vascular anatomy. RADIATION DOSE REDUCTION: This exam was performed according to the departmental dose-optimization program which includes automated exposure control, adjustment of the mA and/or kV according to patient size and/or use of iterative reconstruction technique. CONTRAST:  OMNIPAQUE  IOHEXOL  350 MG/ML SOLN COMPARISON:  Abdomen and pelvis CT 02/09/2023 FINDINGS: VASCULAR Aorta: Normal caliber aorta without aneurysm, dissection, vasculitis or significant stenosis. Moderate atherosclerotic disease. Celiac: Patent without evidence of aneurysm, dissection, vasculitis or significant stenosis. SMA: Patent without evidence of aneurysm, dissection, vasculitis or significant stenosis. Renals: Both renal arteries are patent without evidence of aneurysm, dissection, vasculitis, fibromuscular dysplasia or significant stenosis. IMA: Patent without evidence of aneurysm, dissection, vasculitis or significant stenosis. Inflow: Patent without evidence of aneurysm, dissection, vasculitis or significant stenosis. Proximal Outflow: Bilateral common femoral and visualized portions of the superficial and profunda femoral arteries are patent without evidence of aneurysm, dissection, vasculitis or significant stenosis. Veins: Portal vein and superior mesenteric vein are patent. No discernible venous thrombosis within the small bowel mesentery Review of the MIP images confirms the above findings. NON-VASCULAR Lower  chest: Tiny bilateral pleural effusions with dependent atelectasis. Hepatobiliary: No suspicious focal abnormality within the liver parenchyma. Small area of low attenuation in the anterior liver, adjacent to the falciform ligament, is in a characteristic location for focal fatty deposition. Gallbladder is surgically absent. No intrahepatic or extrahepatic biliary dilation. Pancreas: No focal mass lesion. No dilatation of the main duct. No intraparenchymal cyst. No peripancreatic edema. Spleen: No splenomegaly. No suspicious focal mass lesion. Adrenals/Urinary Tract: No adrenal nodule or mass. Tiny well-defined homogeneous low-density lesions in both kidneys are too small to characterize but are statistically most likely benign and probably cysts. No followup imaging is recommended. No evidence for hydroureter. The urinary bladder appears normal for the degree of distention. Stomach/Bowel: Stomach is unremarkable. No gastric wall thickening. No evidence of outlet obstruction. Duodenum is normally positioned as is the ligament of Treitz. 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 On today's exam, there is a fluid collection in the region of the diverticulum with multiple tiny gas bubbles associated. Coronal image 72/14 raises the question of a contained fluid collection, but there is no duodenal wall thickening in these changes are probably all related to the patient's known diverticulum. Small bowel loops have become more diffusely distended in the interval measuring up to 3.2 cm diameter. No discernible small bowel wall thickening and no areas of small-bowel mural non-enhancement are evident. There is no small bowel pneumatosis. Patient is noted to have a completely decompressed distal and terminal ileum with an apparent abrupt transition zone in the right pelvis (see axial 136/5 and coronal 48/14). The appendix is not visible compatible with reported history of appendectomy. No gross colonic  mass. No colonic wall thickening. Diverticuli  are seen scattered along the entire length of the colon without CT findings of diverticulitis. Lymphatic: There is no gastrohepatic or hepatoduodenal ligament lymphadenopathy. No retroperitoneal or mesenteric lymphadenopathy. No pelvic sidewall lymphadenopathy. Reproductive: Hysterectomy.  There is no adnexal mass. Other: Increasing free fluid is seen in the abdomen and pelvis with mesenteric edema/congestion also progressive since prior. Musculoskeletal: Tiny umbilical hernia contains fat and trace fluid. Gas bubbles in the subcutaneous fat of the right lower anterior abdominal wall likely related to an injection site. No worrisome lytic or sclerotic osseous abnormality. IMPRESSION: 1. No definite CT evidence for mesenteric ischemia. Mesenteric arterial anatomy is patent. No evidence for filling defect within the central mesenteric venous anatomy. No small bowel wall thickening. No small bowel pneumatosis. No areas of small-bowel wall non-enhancement are identified. No intraperitoneal free gas. 2. Small bowel loops have become more diffusely distended in the interval measuring up to 3.2 cm diameter. Patient is noted to have a completely decompressed distal and terminal ileum with an apparent abrupt transition zone in the right pelvis. This abrupt transition zone corresponds to the abnormality identified on the CT scan from 2023-03-11, but appears more pronounced on today's exam likely due to the increased bowel distension. Imaging features raise concern for small-bowel obstruction, potentially secondary to an adhesion. Internal hernia/closed loop obstruction is not excluded. 3. Increasing free fluid in the abdomen and pelvis with mesenteric edema/congestion also progressive since prior. 4. 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 On today's exam, there is a fluid collection in the region of the diverticulum with multiple tiny gas bubbles  associated. The anatomy of the diverticulum is not well demonstrated on today's study and these findings are likely related to the duodenal diverticulum. Contain retroperitoneal duodenal perforation is considered less likely but not entirely excluded. 5. Tiny bilateral pleural effusions with dependent atelectasis. 6.  Aortic Atherosclerosis (ICD10-I70.0). I discussed these findings by telephone with Dr. Regalado at approximately 1425 hours on 02/13/2023. Electronically Signed   By: Camellia Candle M.D.   On: 02/13/2023 14:25    Anti-infectives: Anti-infectives (From admission, onward)    Start     Dose/Rate Route Frequency Ordered Stop   02/13/23 1200  piperacillin -tazobactam (ZOSYN ) IVPB 3.375 g        3.375 g 12.5 mL/hr over 240 Minutes Intravenous Every 8 hours 02/13/23 0918     02/12/23 1230  amoxicillin -clavulanate (AUGMENTIN ) 875-125 MG per tablet 1 tablet  Status:  Discontinued        1 tablet Oral Every 12 hours 02/12/23 1131 02/13/23 0902   02/10/23 1400  piperacillin -tazobactam (ZOSYN ) IVPB 3.375 g  Status:  Discontinued        3.375 g 12.5 mL/hr over 240 Minutes Intravenous Every 8 hours 02/10/23 1205 02/12/23 1131   02/10/23 1000  amoxicillin -clavulanate (AUGMENTIN ) 875-125 MG per tablet 1 tablet  Status:  Discontinued        1 tablet Oral Every 12 hours 02/10/23 0809 02/10/23 1205   2023/03/11 1700  piperacillin -tazobactam (ZOSYN ) IVPB 3.375 g        3.375 g 12.5 mL/hr over 240 Minutes Intravenous  Once 03/11/23 1652 03/11/2023 2145       Assessment/Plan: Small bowel obstruction (probably partial) with no signs of ischemia or perforation SBO likely secondary to adhesions from previous surgery.    NPO x ice chips Hold any long-acting anticoagulation NG tube SBO protocol - film from this morning reviewed with contrast in the colon but still with dilated small bowel c/w with  a partial obstruction Will continue to try to avoid surgery if possible, but if no improvement in the next  day or two, may need to consider surgical intervention. -family members at bedside and plan discussed with them -discussed with primary service as well  FEN - NPO/IVFs/NGT VTE - Lovenox  ID - zosyn , this is not needed from an abdominal standpoint.  Bowel obstruction is not infectious in nature    LOS: 5 days    Burnard FORBES Banter 02/15/2023

## 2023-02-15 NOTE — Op Note (Signed)
 Operative Note  Candice Brown  [id number]  [id number]  02/15/2023   Surgeon: Mitzie Freund MD FACS   Assistant: Burnard Banter PA-C   Procedure performed: Exploratory laparotomy, lysis of adhesive bands x 2   Preop diagnosis: Small bowel obstruction Post-op diagnosis/intraop findings: Same, closed-loop obstruction in the right lower quadrant without bowel compromise; additional adhesive band from the omentum to the left lower quadrant which was not actively obstructing but represented impending obstruction.   Specimens: no Retained items: no  EBL: Minimal  Complications: none   Description of procedure: After confirming informed consent the patient was taken to the operating room and placed supine on operating room table where general endotracheal anesthesia was initiated, preoperative antibiotics were administered, SCDs applied, and a formal timeout was performed.  Foley catheter was inserted which is removed at the end of the case.  The abdomen was prepped and draped in usual sterile fashion.  A small vertical midline incision was created and the peritoneal cavity was entered without difficulty.  Small volume of slightly blood-tinged ascites was evacuated.  The small bowel was carefully mobilized and eviscerated.  She appeared to have a closed-loop obstruction involving the mid to distal ileum secondary to adhesions.  The bowel was able to be gently manipulated and carefully reduced intact.  The bowel appeared viable and was peristalsing after being reduced.  This segment was about 10 cm long and the area of constriction on each side appeared to slowly dilate up throughout the course of the case.  This was clearly the transition point.  We identified the ligament of Treitz and began to run the bowel distally from here.  In doing so we identified another adhesive band from the omentum to the left lower quadrant around which the bowel was twisting as we attempted to run it.  This band was carefully  divided with cautery relieving this impending obstruction.  We again return to the ligament of Treitz and ran the bowel in its entirety to the ileocecal valve confirming no other abnormality or stricture.  The bowel distal to the previous obstruction had already began to fill with enteric contents and the previously entrapped closed-loop was again confirmed to be viable.  The small bowel was returned to the abdominal cavity in normal anatomical position.  The omentum and transverse colon were brought down over the small bowel.  The NG tube was palpated in the stomach and repositioned slightly and confirmed to be in appropriate position.  At this point the fascia was closed with running looped #1 PDS starting at either end and tying centrally.  Hemostasis was ensured within the wound.  The deep soft tissue was reapproximated with a few simple interrupted 3-0 Vicryl's and the skin was closed with a running subcuticular 4-0 Monocryl followed by benzoin, Steri-Strips, and a honeycomb dressing.  The patient was then awakened, extubated and taken to PACU in stable condition.    All counts were correct at the completion of the case.

## 2023-02-15 NOTE — Plan of Care (Signed)
 Pt continues with NG tube to low intermittent wall suction. VSS, on room air.  Safety maintained. Bed alarm on. Call bell in reach. Will continue to monitor.    Problem: Education: Goal: Knowledge of General Education information will improve Description: Including pain rating scale, medication(s)/side effects and non-pharmacologic comfort measures Outcome: Progressing   Problem: Health Behavior/Discharge Planning: Goal: Ability to manage health-related needs will improve Outcome: Progressing   Problem: Clinical Measurements: Goal: Ability to maintain clinical measurements within normal limits will improve Outcome: Progressing Goal: Will remain free from infection Outcome: Progressing Goal: Diagnostic test results will improve Outcome: Progressing Goal: Respiratory complications will improve Outcome: Progressing Goal: Cardiovascular complication will be avoided Outcome: Progressing   Problem: Activity: Goal: Risk for activity intolerance will decrease Outcome: Progressing   Problem: Nutrition: Goal: Adequate nutrition will be maintained Outcome: Progressing   Problem: Coping: Goal: Level of anxiety will decrease Outcome: Progressing   Problem: Elimination: Goal: Will not experience complications related to bowel motility Outcome: Progressing Goal: Will not experience complications related to urinary retention Outcome: Progressing   Problem: Pain Management: Goal: General experience of comfort will improve Outcome: Progressing   Problem: Safety: Goal: Ability to remain free from injury will improve Outcome: Progressing   Problem: Skin Integrity: Goal: Risk for impaired skin integrity will decrease Outcome: Progressing

## 2023-02-15 NOTE — Plan of Care (Signed)
  Problem: Activity: Goal: Risk for activity intolerance will decrease Outcome: Progressing   Problem: Coping: Goal: Level of anxiety will decrease Outcome: Progressing   Problem: Pain Management: Goal: General experience of comfort will improve Outcome: Progressing   Problem: Safety: Goal: Ability to remain free from injury will improve Outcome: Progressing   Problem: Skin Integrity: Goal: Risk for impaired skin integrity will decrease Outcome: Progressing

## 2023-02-15 NOTE — Anesthesia Procedure Notes (Signed)
 Procedure Name: Intubation Date/Time: 02/15/2023 12:55 PM  Performed by: Therisa Doyal CROME, CRNAPre-anesthesia Checklist: Patient identified, Emergency Drugs available, Suction available and Patient being monitored Patient Re-evaluated:Patient Re-evaluated prior to induction Oxygen Delivery Method: Circle system utilized Preoxygenation: Pre-oxygenation with 100% oxygen Induction Type: IV induction, Cricoid Pressure applied and Rapid sequence Laryngoscope Size: Miller and 2 Grade View: Grade I Tube type: Oral Tube size: 7.0 mm Number of attempts: 1 Airway Equipment and Method: Stylet Placement Confirmation: ETT inserted through vocal cords under direct vision, positive ETCO2 and breath sounds checked- equal and bilateral Secured at: 21 cm Tube secured with: Tape Dental Injury: Teeth and Oropharynx as per pre-operative assessment

## 2023-02-16 ENCOUNTER — Inpatient Hospital Stay (HOSPITAL_COMMUNITY): Payer: Medicare Other

## 2023-02-16 ENCOUNTER — Other Ambulatory Visit: Payer: Self-pay

## 2023-02-16 ENCOUNTER — Encounter (HOSPITAL_COMMUNITY): Payer: Self-pay | Admitting: Surgery

## 2023-02-16 DIAGNOSIS — K56609 Unspecified intestinal obstruction, unspecified as to partial versus complete obstruction: Secondary | ICD-10-CM | POA: Diagnosis present

## 2023-02-16 DIAGNOSIS — K529 Noninfective gastroenteritis and colitis, unspecified: Secondary | ICD-10-CM | POA: Diagnosis not present

## 2023-02-16 LAB — CBC
HCT: 37.9 % (ref 36.0–46.0)
Hemoglobin: 12.5 g/dL (ref 12.0–15.0)
MCH: 33 pg (ref 26.0–34.0)
MCHC: 33 g/dL (ref 30.0–36.0)
MCV: 100 fL (ref 80.0–100.0)
Platelets: 312 10*3/uL (ref 150–400)
RBC: 3.79 MIL/uL — ABNORMAL LOW (ref 3.87–5.11)
RDW: 12.3 % (ref 11.5–15.5)
WBC: 9.8 10*3/uL (ref 4.0–10.5)
nRBC: 0 % (ref 0.0–0.2)

## 2023-02-16 LAB — BASIC METABOLIC PANEL
Anion gap: 8 (ref 5–15)
BUN: 23 mg/dL (ref 8–23)
CO2: 25 mmol/L (ref 22–32)
Calcium: 8.2 mg/dL — ABNORMAL LOW (ref 8.9–10.3)
Chloride: 102 mmol/L (ref 98–111)
Creatinine, Ser: 0.64 mg/dL (ref 0.44–1.00)
GFR, Estimated: >60 mL/min (ref >60)
Glucose, Bld: 77 mg/dL (ref 70–99)
Potassium: 3.5 mmol/L (ref 3.5–5.1)
Sodium: 135 mmol/L (ref 135–145)

## 2023-02-16 LAB — MAGNESIUM: Magnesium: 2.1 mg/dL (ref 1.7–2.4)

## 2023-02-16 LAB — PHOSPHORUS: Phosphorus: 2.6 mg/dL (ref 2.5–4.6)

## 2023-02-16 MED ORDER — THIAMINE HCL 100 MG/ML IJ SOLN
100.0000 mg | Freq: Every day | INTRAMUSCULAR | Status: AC
  Administered 2023-02-16 – 2023-02-20 (×5): 100 mg via INTRAVENOUS
  Filled 2023-02-16 (×5): qty 2

## 2023-02-16 MED ORDER — INSULIN ASPART 100 UNIT/ML IJ SOLN
0.0000 [IU] | Freq: Three times a day (TID) | INTRAMUSCULAR | Status: DC
  Administered 2023-02-17 (×2): 1 [IU] via SUBCUTANEOUS
  Administered 2023-02-18: 2 [IU] via SUBCUTANEOUS

## 2023-02-16 MED ORDER — TRAVASOL 10 % IV SOLN
INTRAVENOUS | Status: AC
  Filled 2023-02-16: qty 302.4

## 2023-02-16 MED ORDER — CHLORHEXIDINE GLUCONATE CLOTH 2 % EX PADS
6.0000 | MEDICATED_PAD | Freq: Every day | CUTANEOUS | Status: DC
  Administered 2023-02-16 – 2023-02-26 (×10): 6 via TOPICAL

## 2023-02-16 MED ORDER — SODIUM CHLORIDE 0.9% FLUSH
10.0000 mL | INTRAVENOUS | Status: DC | PRN
  Administered 2023-02-18: 10 mL

## 2023-02-16 NOTE — Progress Notes (Signed)
 0335: Noted NG tube was out and lying on the bedside, accidentally removed by patient. Anthoney Harada, NP notified and reinserted by Darrick Huntsman RN per order -see flow sheet. Patient tolerated well. Waiting for x-ray placement confirmation at this time.

## 2023-02-16 NOTE — Plan of Care (Signed)

## 2023-02-16 NOTE — Progress Notes (Signed)
 Peripherally Inserted Central Catheter Placement  The IV Nurse has discussed with the patient and/or persons authorized to consent for the patient, the purpose of this procedure and the potential benefits and risks involved with this procedure.  The benefits include less needle sticks, lab draws from the catheter, and the patient may be discharged home with the catheter. Risks include, but not limited to, infection, bleeding, blood clot (thrombus formation), and puncture of an artery; nerve damage and irregular heartbeat and possibility to perform a PICC exchange if needed/ordered by physician.  Alternatives to this procedure were also discussed.  Bard Power PICC patient education guide, fact sheet on infection prevention and patient information card has been provided to patient /or left at bedside.    PICC Placement Documentation  PICC Double Lumen 02/16/23 Right Basilic 37 cm 0 cm (Active)  Indication for Insertion or Continuance of Line Administration of hyperosmolar/irritating solutions (i.e. TPN, Vancomycin, etc.) 02/16/23 1623  Exposed Catheter (cm) 0 cm 02/16/23 1623  Site Assessment Clean, Dry, Intact 02/16/23 1623  Lumen #1 Status Flushed;Saline locked;Blood return noted 02/16/23 1623  Lumen #2 Status Flushed;Saline locked;Blood return noted 02/16/23 1623  Dressing Type Transparent;Securing device 02/16/23 1623  Dressing Status Antimicrobial disc in place;Clean, Dry, Intact 02/16/23 1623  Line Care Connections checked and tightened 02/16/23 1623  Line Adjustment (NICU/IV Team Only) No 02/16/23 1623  Dressing Intervention New dressing;Adhesive placed at insertion site (IV team only);Adhesive placed around edges of dressing (IV team/ICU RN only) 02/16/23 1623  Dressing Change Due 02/23/23 02/16/23 1623       Candice Brown 02/16/2023, 4:24 PM

## 2023-02-16 NOTE — Plan of Care (Signed)
  Problem: Education: Goal: Knowledge of General Education information will improve Description: Including pain rating scale, medication(s)/side effects and non-pharmacologic comfort measures Outcome: Progressing   Problem: Health Behavior/Discharge Planning: Goal: Ability to manage health-related needs will improve Outcome: Progressing   Problem: Clinical Measurements: Goal: Ability to maintain clinical measurements within normal limits will improve Outcome: Progressing Goal: Will remain free from infection Outcome: Progressing Goal: Diagnostic test results will improve Outcome: Progressing Goal: Respiratory complications will improve Outcome: Progressing   Problem: Activity: Goal: Risk for activity intolerance will decrease Outcome: Progressing   Problem: Nutrition: Goal: Adequate nutrition will be maintained Outcome: Progressing   Problem: Coping: Goal: Level of anxiety will decrease Outcome: Progressing   Problem: Elimination: Goal: Will not experience complications related to bowel motility Outcome: Progressing Goal: Will not experience complications related to urinary retention Outcome: Progressing   Problem: Pain Management: Goal: General experience of comfort will improve Outcome: Progressing   Problem: Safety: Goal: Ability to remain free from injury will improve Outcome: Progressing   Problem: Skin Integrity: Goal: Risk for impaired skin integrity will decrease Outcome: Progressing

## 2023-02-16 NOTE — Progress Notes (Signed)
 Patient is A&O x 4. VSS, on room air. NG tube to left nares to low intermittent suction in place. Dressing to mid abdomen in place. Denies discomfort. Medications given as ordered. IV fluids infusing as ordered.   Educated on falls and safety precautions, call bell and NG tube, plan of care. Pt verbalizes understanding. Safety maintained. Bed alarm on. Call bell in reach. Will continue to monitor.

## 2023-02-16 NOTE — Progress Notes (Addendum)
 Initial Nutrition Assessment  DOCUMENTATION CODES:   Not applicable  INTERVENTION:   TPN per pharmacy   Recommend thiamine  100mg  IV daily x 5 days  Pt at high refeed risk; recommend monitor potassium, magnesium and phosphorus labs daily until stable  Daily weights   NUTRITION DIAGNOSIS:   Inadequate oral intake related to altered GI function (post op ileus) as evidenced by NPO status.  GOAL:   Patient will meet greater than or equal to 90% of their needs  MONITOR:   Diet advancement, Labs, Weight trends, Skin, I & O's, Other (Comment) (TPN)  REASON FOR ASSESSMENT:   Consult New TPN/TNA  ASSESSMENT:   89 y/o female with h/o breat cancer s/p lumpectomy with radioactive seed localization (2023), HLD, GERD, diverticulitis, IBS, hiatal hernia and depression who is admitted with SBO with closed-loop obstruction (involving the mid to distal ileum) now s/p exploratory laparotomy with lysis of adhesions 1/3 complicated by post op ileus.  RD working remotely.  Pt awaiting return of bowel function. Pt noted to have abdominal distension/bloating today and burping. No BM or flatus yet. NGT removed by patient overnight but was replaced. Pt has now been without adequate nutrition for ~ 7 days. Plan today is for PICC line and TPN. Pt is at high refeed risk.   Per chart, pt appears weight stable pta. RD will obtain nutrition related history and exam at follow up. Pt is at high risk for developing malnutrition.   Medications reviewed and include: lovenox , insulin , protonix , TPN  Labs reviewed: K 3.5 wnl, P 2.6 wnl, Mg 2.1 wnl  NUTRITION - FOCUSED PHYSICAL EXAM: Unable to perform at this time   Diet Order:   Diet Order             Diet NPO time specified Except for: Ice Chips  Diet effective now                  EDUCATION NEEDS:   No education needs have been identified at this time  Skin:  Skin Assessment: Reviewed RN Assessment (incision abdomen)  Last BM:  12/31-  type 1  Height:   Ht Readings from Last 1 Encounters:  02/15/23 5' 3 (1.6 m)    Weight:   Wt Readings from Last 1 Encounters:  02/15/23 53.5 kg    Ideal Body Weight:  52.2 kg  BMI:  Body mass index is 20.9 kg/m.  Estimated Nutritional Needs:   Kcal:  1300-1500kcal/day  Protein:  65-75g/day  Fluid:  1.3-1.5L/day  Augustin Shams MS, RD, LDN If unable to be reached, please send secure chat to RD inpatient available from 8:00a-4:00p daily

## 2023-02-16 NOTE — Progress Notes (Signed)
 PROGRESS NOTE    Candice Brown  FMW:[id number] DOB: 01/31/1932 DOA: 02/09/2023 PCP: Shepard Ade, MD   Brief Narrative:  89 year old PMH significant for hyperlipidemia, GERD, presented to the ED presented to hospital with abdominal pain, nausea vomiting and constipation for the last 24 hours.  She has a past medical history of hysterectomy, appendectomy and cholecystectomy.  CT abdomen and pelvis concern for short segment abdominal small bowel right lower quadrant regional inflammatory  edematous changes in the mesentery a small volume ascites.  Consideration for localized infectious, inflammatory or ischemic enteritis.  Patient was started on IV Zosyn  and was admitted to hospital for further evaluation and treatment.  During hospitalization, patient failed to improve repeated CT abdomen and pelvis 1/1 showed finding consistent with SBO.  General surgery consulted.  NG tube placed, small bowel protocol was started.  Patient did not improve so subsequently underwent surgical intervention 02/14/2022.  Assessment & Plan:   Principal Problem:   SBO (small bowel obstruction) - closed loop - s/p exlap/lysis of adhesions 02/15/2023 Active Problems:   Malignant neoplasm of upper-outer quadrant of right breast in female, estrogen receptor positive (HCC)   Enteritis   HLD (hyperlipidemia)   GERD (gastroesophageal reflux disease)   Gastroenteritis  Small bowel obstruction. Initially CT of the abdomen finding concerning for enteritis.  She was initially treated conservatively with IV fluids antibiotic but unimproved so repeat CT scan was done which showed small bowel obstruction.    General surgery recommended surgical intervention and patient subsequently underwent exploratory laparotomy with lysis of adhesions on 02/15/2023.  Currently on NG tube and conservative treatment.  Follow as per surgical recommendation.  Due to anticipation for following ileus TPN has been requested.  Continue IV fluids.   Currently NPO.  Hyperlipidemia On Crestor  as outpatient currently on hold.  History of breast cancer Will follow-up as outpatient.  GERD: PPI IV.  Hyponatremia: Latest sodium 135.  Improved with IV fluids.  Hypokalemia Improved.  Latest potassium 3.5.   DVT prophylaxis: Lovenox   Code Status: Full code  Family Communication:  Spoke with the patient's daughter at bedside on 02/14/2018.  Disposition Plan:   Status is: Inpatient Remains inpatient appropriate because: Postoperative bowel obstruction, pending clinical improvement.   Consultants:  General surgery  Procedures:  NG tube placement exploratory laparotomy with lysis of adhesions on 02/15/2023.  Antimicrobials:  None  Subjective: Today, patient was seen and examined at bedside.  Patient stated that she had nightmares and had pulled the tube in the morning which had to be replaced.  She denies any nausea, vomiting but has not had a bowel movement or passed flatus.  Feels bloated with with belching.  Complains of incisional pain.     Objective: Vitals:   02/15/23 1445 02/15/23 1520 02/15/23 1940 02/16/23 0504  BP: (!) 171/71 (!) 163/73 (!) 142/66 (!) 122/52  Pulse: 73 79 71 67  Resp: 12  12 16   Temp:  97.9 F (36.6 C) 98 F (36.7 C) 98.5 F (36.9 C)  TempSrc:  Oral    SpO2: 95% 92% 96% 94%  Weight:      Height:        Intake/Output Summary (Last 24 hours) at 02/16/2023 1038 Last data filed at 02/16/2023 0723 Gross per 24 hour  Intake 1966.31 ml  Output 250 ml  Net 1716.31 ml   Filed Weights   02/09/23 1111 02/15/23 1221  Weight: 53.5 kg 53.5 kg    Physical examination:  Body mass index is  20.9 kg/m.   General:  Average built, not in obvious distress, elderly female, Communicative, NG tube in place HENT:   No scleral pallor or icterus noted. Oral mucosa is moist.,  NG tube in place  Chest:   Diminished breath sounds bilaterally. No crackles or wheezes.  CVS: S1 &S2 heard. No murmur.  Regular  rate and rhythm. Abdomen:  incisional tenderness noted, no bowel sounds heard. Extremities: No cyanosis, clubbing or edema.  Peripheral pulses are palpable. Psych: Alert, awake and oriented, normal mood CNS:  No cranial nerve deficits.  Power equal in all extremities.   Skin: Warm and dry.  No rashes noted.  Data Reviewed: I have personally reviewed following labs and imaging studies  CBC: Recent Labs  Lab 02/11/23 0503 02/12/23 0523 02/14/23 0534 02/15/23 0538 02/16/23 0704  WBC 5.4 8.1 9.2 9.5 9.8  HGB 13.4 14.0 13.6 13.4 12.5  HCT 41.7 42.9 41.5 39.7 37.9  MCV 103.0* 100.0 101.0* 98.5 100.0  PLT 280 315 298 327 312   Basic Metabolic Panel: Recent Labs  Lab 02/12/23 0523 02/13/23 0532 02/14/23 0534 02/15/23 0538 02/16/23 0704  NA 129* 133* 132* 137 135  K 3.5 3.4* 4.1 3.7 3.5  CL 93* 97* 99 99 102  CO2 24 27 22 26 25   GLUCOSE 144* 98 97 97 77  BUN 21 22 21  24* 23  CREATININE 0.89 0.80 0.69 0.76 0.64  CALCIUM  8.7* 8.9 8.9 9.3 8.2*  MG  --   --  2.2  --  2.1  PHOS  --   --   --   --  2.6   GFR: Estimated Creatinine Clearance: 39.4 mL/min (by C-G formula based on SCr of 0.64 mg/dL). Liver Function Tests: Recent Labs  Lab 02/09/23 1135  AST 29  ALT 18  ALKPHOS 45  BILITOT 0.7  PROT 7.3  ALBUMIN  4.2   Recent Labs  Lab 02/09/23 1135  LIPASE 88*   No results for input(s): AMMONIA in the last 168 hours. Coagulation Profile: No results for input(s): INR, PROTIME in the last 168 hours. Cardiac Enzymes: No results for input(s): CKTOTAL, CKMB, CKMBINDEX, TROPONINI in the last 168 hours. BNP (last 3 results) No results for input(s): PROBNP in the last 8760 hours. HbA1C: No results for input(s): HGBA1C in the last 72 hours. CBG: No results for input(s): GLUCAP in the last 168 hours. Lipid Profile: No results for input(s): CHOL, HDL, LDLCALC, TRIG, CHOLHDL, LDLDIRECT in the last 72 hours. Thyroid  Function Tests: No results  for input(s): TSH, T4TOTAL, FREET4, T3FREE, THYROIDAB in the last 72 hours. Anemia Panel: Recent Labs    02/14/23 0534  VITAMINB12 354   Sepsis Labs: Recent Labs  Lab 02/09/23 1648 02/09/23 1956  LATICACIDVEN 0.8 0.9    Recent Results (from the past 240 hours)  Blood culture (routine x 2)     Status: None   Collection Time: 02/09/23  5:30 PM   Specimen: BLOOD  Result Value Ref Range Status   Specimen Description   Final    BLOOD LEFT ANTECUBITAL Performed at [facility], [address] [place], [place] [address]    Special Requests   Final    BOTTLES DRAWN AEROBIC AND ANAEROBIC Blood Culture adequate volume Performed at [facility], [address], [place], [place] [address]    Culture   Final    NO GROWTH 5 DAYS Performed at [facility], [address], [place], [place] [address]  Report Status 02/14/2023 FINAL  Final  Blood culture (routine x 2)     Status: None   Collection Time: 02/09/23  5:45 PM   Specimen: BLOOD  Result Value Ref Range Status   Specimen Description   Final    BLOOD LEFT ANTECUBITAL Performed at [facility], [address] [place], [place] [address]    Special Requests   Final    BOTTLES DRAWN AEROBIC AND ANAEROBIC Blood Culture results may not be optimal due to an inadequate volume of blood received in culture bottles Performed at [facility], [address] [place], [place] [address]    Culture   Final    NO GROWTH 5 DAYS Performed at [facility], [address], [place], [place] [address]    Report Status 02/14/2023 FINAL  Final  MRSA Next Gen by PCR, Nasal     Status: None   Collection Time: 02/09/23  9:06 PM   Specimen: Nasal Mucosa; Nasal Swab  Result Value Ref Range Status   MRSA by PCR Next Gen NOT DETECTED NOT DETECTED Final    Comment: (NOTE) The GeneXpert MRSA Assay (FDA approved for NASAL specimens only), is one  component of a comprehensive MRSA colonization surveillance program. It is not intended to diagnose MRSA infection nor to guide or monitor treatment for MRSA infections. Test performance is not FDA approved in patients less than 7 years old. Performed at [facility], [address], [place], [place] [address]      Radiology Studies: US  EKG SITE RITE Result Date: 02/16/2023 If Site Rite image not attached, placement could not be confirmed due to current cardiac rhythm.  DG Chest Port 1 View Result Date: 02/16/2023 CLINICAL DATA:  89 year old female status post nasogastric tube placement. EXAM: PORTABLE CHEST 1 VIEW COMPARISON:  Chest x-ray 01/19/2016. FINDINGS: Nasogastric tube extends into the proximal stomach with side port just distal to the gastroesophageal junction. Lung volumes are low. No consolidative airspace disease. No pleural effusions. No pneumothorax. No pulmonary nodule or mass noted. Pulmonary vasculature and the cardiomediastinal silhouette are within normal limits. Atherosclerosis in the thoracic aorta. Surgical clips project over the right upper quadrant of the abdomen, likely from prior cholecystectomy. Gaseous distention is noted in the upper abdomen, including what appear to be dilated loops of small bowel, similar to prior studies. IMPRESSION: 1. Support apparatus, as above. 2. Low lung volumes without radiographic evidence of acute cardiopulmonary disease. 3. Aortic atherosclerosis. 4. Dilated loops of small bowel in the upper abdomen suggestive of underlying small bowel obstruction, similar to prior abdominal radiographs. Electronically Signed   By: Toribio Aye M.D.   On: 02/16/2023 05:43   DG Abd Portable 1V Result Date: 02/15/2023 CLINICAL DATA:  Small bowel obstruction EXAM: PORTABLE ABDOMEN - 1 VIEW COMPARISON:  Yesterday FINDINGS: Enteric tube tip and side port at the stomach. Continued dilatation of small bowel with gas filling. Some contrast has  reached the proximal colon. IMPRESSION: Some contrast has reached the colon but there is ongoing small bowel obstruction with unchanged diffuse gaseous distension of small bowel. Electronically Signed   By: Dorn Roulette M.D.   On: 02/15/2023 08:04   DG Abd Portable 1V Result Date: 02/14/2023 CLINICAL DATA:  Small-bowel obstruction EXAM: PORTABLE ABDOMEN - 1 VIEW COMPARISON:  02/14/2023 FINDINGS: Supine frontal view of the abdomen and pelvis excludes the right hemidiaphragm by collimation. Enteric catheter tip within the gastric fundus. Oral contrast remains within the stomach. Minimal  dilute contrast is seen within dilated loops of small bowel, consistent with small-bowel obstruction. There is no oral contrast seen within the expected region of the colon. IMPRESSION: 1. Minimal progression of oral contrast, with dilute contrast seen within dilated loops of small bowel throughout the abdomen, consistent with small-bowel obstruction. No transit of contrast into the colon by the time of imaging. Electronically Signed   By: Ozell Daring M.D.   On: 02/14/2023 21:51    Scheduled Meds:  enoxaparin  (LOVENOX ) injection  40 mg Subcutaneous Q24H   [START ON 02/17/2023] insulin  aspart  0-9 Units Subcutaneous Q8H   pantoprazole  (PROTONIX ) IV  40 mg Intravenous Q12H   pregabalin   75 mg Oral Daily   Continuous Infusions:  acetaminophen  1,000 mg (02/16/23 0627)   lactated ringers  75 mL/hr at 02/16/23 0723   TPN ADULT (ION)       LOS: 6 days    Vernal Alstrom, MD [facility]  If 7PM-7AM, please contact night-coverage [email or web address]  02/16/2023, 10:38 AM

## 2023-02-16 NOTE — Progress Notes (Addendum)
 PHARMACY - TOTAL PARENTERAL NUTRITION CONSULT NOTE   Indication: Prolonged ileus  Patient Measurements: Height: 5' 3 (160 cm) Weight: 53.5 kg (118 lb) IBW/kg (Calculated) : 52.4 TPN AdjBW (KG): 53.5 Body mass index is 20.9 kg/m.  Assessment: 89 year old female with small bowel obstruction s/p ex lap and lysis of adhesions. Patient with prolonged lack of nutrition. Has NGT in place, currently to suction. Plan for TPN.  Glucose / Insulin : serum glucose < 100 Electrolytes: Ca slightly low (in setting of recently normal albumin ) Renal: stable Hepatic: previously WNL Intake / Output; MIVF:  -LR at 75 ml/hr -NG output appears to have decreased from prior, only 50 ml documented 1/3 -LBM documented 1/3 GI Imaging: 1/3 Abd xray - ongoing small bowel obstruction with unchanged diffuse gaseous distension of small bowel GI Surgeries / Procedures:  1/3 ex lap with lysis of adhesions  Central access: pending (plan is for IV team to place this evening) TPN start date: 1/4 pending PICC placement  Nutritional Goals: Goal TPN rate is 70 mL/hr (provides 60 g of protein and 1370 kcals per day)  Pending further RD assessment, will aim for estimated daily needs of approximately 55-65 g protein and 1100-1600 kcal  RD Assessment: pending  Current Nutrition: NPO  Plan:  -Start TPN at 35 mL/hr at 1800 -Electrolytes in TPN: Na 50 mEq/L, K 50 mEq/L, Ca 5 mEq/L, Mg 5 mEq/L, and Phos 15 mmol/L. Cl:Ac 1:1 -Add standard MVI and trace elements to TPN -Thiamine  x 5 days for refeeding risk -Initiate Sensitive q8h SSI and adjust as needed. May be able to discontinue once TPN at goal if not needing -Defer continuation of MIVF to MD for now, but likely can stop once TPN started or at goal rate -Monitor TPN labs on Mon/Thurs, daily at start to monitor for refeeding syndrome  Stefano MARLA Bologna, PharmD, BCPS Clinical Pharmacist 02/16/2023 10:15 AM

## 2023-02-16 NOTE — Progress Notes (Signed)
 1 Day Post-Op   Subjective/Chief Complaint: Pt had nightmare and pulled NG and IV's overnight which are now replaced Now comfortable with some pain but feels bloated still Denies flatus   Objective: Vital signs in last 24 hours: Temp:  [97.7 F (36.5 C)-98.5 F (36.9 C)] 98.5 F (36.9 C) (01/04 0504) Pulse Rate:  [67-79] 67 (01/04 0504) Resp:  [11-18] 16 (01/04 0504) BP: (122-182)/(52-83) 122/52 (01/04 0504) SpO2:  [92 %-100 %] 94 % (01/04 0504) Weight:  [53.5 kg] 53.5 kg (01/03 1221) Last BM Date : 02/15/23  Intake/Output from previous day: 01/03 0701 - 01/04 0700 In: 1932.6 [I.V.:1382.6; IV Piggyback:550] Out: 251 [Urine:151; Emesis/NG output:50; Blood:50] Intake/Output this shift: Total I/O In: 33.8 [I.V.:33.8] Out: -   Exam: Awake and alert Following commands Abdomen distended, minimally tender  Lab Results:  Recent Labs    02/14/23 0534 02/15/23 0538  WBC 9.2 9.5  HGB 13.6 13.4  HCT 41.5 39.7  PLT 298 327   BMET Recent Labs    02/14/23 0534 02/15/23 0538  NA 132* 137  K 4.1 3.7  CL 99 99  CO2 22 26  GLUCOSE 97 97  BUN 21 24*  CREATININE 0.69 0.76  CALCIUM  8.9 9.3   PT/INR No results for input(s): LABPROT, INR in the last 72 hours. ABG No results for input(s): PHART, HCO3 in the last 72 hours.  Invalid input(s): PCO2, PO2  Studies/Results: DG Chest Port 1 View Result Date: 02/16/2023 CLINICAL DATA:  89 year old female status post nasogastric tube placement. EXAM: PORTABLE CHEST 1 VIEW COMPARISON:  Chest x-ray 01/19/2016. FINDINGS: Nasogastric tube extends into the proximal stomach with side port just distal to the gastroesophageal junction. Lung volumes are low. No consolidative airspace disease. No pleural effusions. No pneumothorax. No pulmonary nodule or mass noted. Pulmonary vasculature and the cardiomediastinal silhouette are within normal limits. Atherosclerosis in the thoracic aorta. Surgical clips project over the right upper  quadrant of the abdomen, likely from prior cholecystectomy. Gaseous distention is noted in the upper abdomen, including what appear to be dilated loops of small bowel, similar to prior studies. IMPRESSION: 1. Support apparatus, as above. 2. Low lung volumes without radiographic evidence of acute cardiopulmonary disease. 3. Aortic atherosclerosis. 4. Dilated loops of small bowel in the upper abdomen suggestive of underlying small bowel obstruction, similar to prior abdominal radiographs. Electronically Signed   By: Toribio Aye M.D.   On: 02/16/2023 05:43   DG Abd Portable 1V Result Date: 02/15/2023 CLINICAL DATA:  Small bowel obstruction EXAM: PORTABLE ABDOMEN - 1 VIEW COMPARISON:  Yesterday FINDINGS: Enteric tube tip and side port at the stomach. Continued dilatation of small bowel with gas filling. Some contrast has reached the proximal colon. IMPRESSION: Some contrast has reached the colon but there is ongoing small bowel obstruction with unchanged diffuse gaseous distension of small bowel. Electronically Signed   By: Dorn Roulette M.D.   On: 02/15/2023 08:04   DG Abd Portable 1V Result Date: 02/14/2023 CLINICAL DATA:  Small-bowel obstruction EXAM: PORTABLE ABDOMEN - 1 VIEW COMPARISON:  02/14/2023 FINDINGS: Supine frontal view of the abdomen and pelvis excludes the right hemidiaphragm by collimation. Enteric catheter tip within the gastric fundus. Oral contrast remains within the stomach. Minimal dilute contrast is seen within dilated loops of small bowel, consistent with small-bowel obstruction. There is no oral contrast seen within the expected region of the colon. IMPRESSION: 1. Minimal progression of oral contrast, with dilute contrast seen within dilated loops of small bowel throughout the abdomen,  consistent with small-bowel obstruction. No transit of contrast into the colon by the time of imaging. Electronically Signed   By: Ozell Daring M.D.   On: 02/14/2023 21:51     Anti-infectives: Anti-infectives (From admission, onward)    Start     Dose/Rate Route Frequency Ordered Stop   02/16/23 0600  ceFAZolin  (ANCEF ) IVPB 2g/100 mL premix        2 g 200 mL/hr over 30 Minutes Intravenous On call to O.R. 02/15/23 1124 02/15/23 1313   02/15/23 1158  piperacillin -tazobactam (ZOSYN ) 3.375 GM/50ML IVPB  Status:  Discontinued       Note to Pharmacy: Gaines, Janel R: cabinet override      02/15/23 1158 02/15/23 1202   02/13/23 1200  piperacillin -tazobactam (ZOSYN ) IVPB 3.375 g  Status:  Discontinued        3.375 g 12.5 mL/hr over 240 Minutes Intravenous Every 8 hours 02/13/23 0918 02/15/23 0916   02/12/23 1230  amoxicillin -clavulanate (AUGMENTIN ) 875-125 MG per tablet 1 tablet  Status:  Discontinued        1 tablet Oral Every 12 hours 02/12/23 1131 02/13/23 0902   02/10/23 1400  piperacillin -tazobactam (ZOSYN ) IVPB 3.375 g  Status:  Discontinued        3.375 g 12.5 mL/hr over 240 Minutes Intravenous Every 8 hours 02/10/23 1205 02/12/23 1131   02/10/23 1000  amoxicillin -clavulanate (AUGMENTIN ) 875-125 MG per tablet 1 tablet  Status:  Discontinued        1 tablet Oral Every 12 hours 02/10/23 0809 02/10/23 1205   02/09/23 1700  piperacillin -tazobactam (ZOSYN ) IVPB 3.375 g        3.375 g 12.5 mL/hr over 240 Minutes Intravenous  Once 02/09/23 1652 02/09/23 2145       Assessment/Plan: POD #1 s/p exp lap and lysis of adhesions by CC  -given lack of nutrition for almost a week and in anticipation of a prolonged ileus, I discussed with her needing a PICC line and starting TPN and she agrees -continue NG  LOS: 6 days    Vicenta Poli MD 02/16/2023

## 2023-02-17 DIAGNOSIS — K529 Noninfective gastroenteritis and colitis, unspecified: Secondary | ICD-10-CM | POA: Diagnosis not present

## 2023-02-17 LAB — COMPREHENSIVE METABOLIC PANEL
ALT: 19 U/L (ref 0–44)
AST: 23 U/L (ref 15–41)
Albumin: 2.7 g/dL — ABNORMAL LOW (ref 3.5–5.0)
Alkaline Phosphatase: 30 U/L — ABNORMAL LOW (ref 38–126)
Anion gap: 8 (ref 5–15)
BUN: 20 mg/dL (ref 8–23)
CO2: 26 mmol/L (ref 22–32)
Calcium: 8.1 mg/dL — ABNORMAL LOW (ref 8.9–10.3)
Chloride: 103 mmol/L (ref 98–111)
Creatinine, Ser: 0.58 mg/dL (ref 0.44–1.00)
GFR, Estimated: >60 mL/min (ref >60)
Glucose, Bld: 105 mg/dL — ABNORMAL HIGH (ref 70–99)
Potassium: 3.3 mmol/L — ABNORMAL LOW (ref 3.5–5.1)
Sodium: 137 mmol/L (ref 135–145)
Total Bilirubin: 0.6 mg/dL (ref 0.0–1.2)
Total Protein: 5.2 g/dL — ABNORMAL LOW (ref 6.5–8.1)

## 2023-02-17 LAB — MAGNESIUM: Magnesium: 2.1 mg/dL (ref 1.7–2.4)

## 2023-02-17 LAB — GLUCOSE, CAPILLARY
Glucose-Capillary: 123 mg/dL — ABNORMAL HIGH (ref 70–99)
Glucose-Capillary: 150 mg/dL — ABNORMAL HIGH (ref 70–99)
Glucose-Capillary: 158 mg/dL — ABNORMAL HIGH (ref 70–99)

## 2023-02-17 LAB — PHOSPHORUS: Phosphorus: 1.8 mg/dL — ABNORMAL LOW (ref 2.5–4.6)

## 2023-02-17 MED ORDER — TRAVASOL 10 % IV SOLN: INTRAVENOUS | Status: DC

## 2023-02-17 MED ORDER — POTASSIUM PHOSPHATES 15 MMOLE/5ML IV SOLN
30.0000 mmol | Freq: Once | INTRAVENOUS | Status: AC
  Administered 2023-02-17: 30 mmol via INTRAVENOUS
  Filled 2023-02-17: qty 10

## 2023-02-17 MED ORDER — TRAVASOL 10 % IV SOLN
INTRAVENOUS | Status: AC
  Filled 2023-02-17: qty 336

## 2023-02-17 NOTE — Progress Notes (Signed)
 2 Days Post-Op   Subjective/Chief Complaint: Comfortable this morning No flatus or bm yet   Objective: Vital signs in last 24 hours: Temp:  [97.9 F (36.6 C)-98.5 F (36.9 C)] 98.5 F (36.9 C) (01/05 0427) Pulse Rate:  [64-69] 69 (01/05 0427) Resp:  [15-17] 15 (01/05 0600) BP: (127-139)/(59-67) 138/63 (01/05 0427) SpO2:  [92 %-94 %] 92 % (01/05 0427) Last BM Date : 02/15/23  Intake/Output from previous day: 01/04 0701 - 01/05 0700 In: 955.9 [I.V.:855.9; IV Piggyback:100] Out: 700 [Urine:600; Emesis/NG output:100] Intake/Output this shift: No intake/output data recorded.  Exam: Awake and alert Following commands Comfortable Abdomen less distended this morning, minimally tender  Lab Results:  Recent Labs    02/15/23 0538 02/16/23 0704  WBC 9.5 9.8  HGB 13.4 12.5  HCT 39.7 37.9  PLT 327 312   BMET Recent Labs    02/16/23 0704 02/17/23 0337  NA 135 137  K 3.5 3.3*  CL 102 103  CO2 25 26  GLUCOSE 77 105*  BUN 23 20  CREATININE 0.64 0.58  CALCIUM  8.2* 8.1*   PT/INR No results for input(s): LABPROT, INR in the last 72 hours. ABG No results for input(s): PHART, HCO3 in the last 72 hours.  Invalid input(s): PCO2, PO2  Studies/Results: DG Abd Portable 1V Result Date: 02/16/2023 CLINICAL DATA:  Follow-up small bowel obstruction. EXAM: PORTABLE ABDOMEN - 1 VIEW COMPARISON:  02/16/2023 FINDINGS: Enteric tube tip and side port are below the GE junction. Surgical clips noted in the right upper quadrant. Dilated small bowel loops within the central abdomen measure up to 3.4 cm on the current exam. Previously these measured up to 4.8 cm. Enteric contrast material is identified within the proximal ascending colon and descending colon. IMPRESSION: Improving small bowel obstruction. Enteric contrast material is identified within the proximal ascending colon and descending colon. Electronically Signed   By: Waddell Calk M.D.   On: 02/16/2023 10:45   US  EKG  SITE RITE Result Date: 02/16/2023 If Site Rite image not attached, placement could not be confirmed due to current cardiac rhythm.  DG Chest Port 1 View Result Date: 02/16/2023 CLINICAL DATA:  89 year old female status post nasogastric tube placement. EXAM: PORTABLE CHEST 1 VIEW COMPARISON:  Chest x-ray 01/19/2016. FINDINGS: Nasogastric tube extends into the proximal stomach with side port just distal to the gastroesophageal junction. Lung volumes are low. No consolidative airspace disease. No pleural effusions. No pneumothorax. No pulmonary nodule or mass noted. Pulmonary vasculature and the cardiomediastinal silhouette are within normal limits. Atherosclerosis in the thoracic aorta. Surgical clips project over the right upper quadrant of the abdomen, likely from prior cholecystectomy. Gaseous distention is noted in the upper abdomen, including what appear to be dilated loops of small bowel, similar to prior studies. IMPRESSION: 1. Support apparatus, as above. 2. Low lung volumes without radiographic evidence of acute cardiopulmonary disease. 3. Aortic atherosclerosis. 4. Dilated loops of small bowel in the upper abdomen suggestive of underlying small bowel obstruction, similar to prior abdominal radiographs. Electronically Signed   By: Toribio Aye M.D.   On: 02/16/2023 05:43    Anti-infectives: Anti-infectives (From admission, onward)    Start     Dose/Rate Route Frequency Ordered Stop   02/16/23 0600  ceFAZolin  (ANCEF ) IVPB 2g/100 mL premix        2 g 200 mL/hr over 30 Minutes Intravenous On call to O.R. 02/15/23 1124 02/15/23 1313   02/15/23 1158  piperacillin -tazobactam (ZOSYN ) 3.375 GM/50ML IVPB  Status:  Discontinued  Note to Pharmacy: Gaines, Janel R: cabinet override      02/15/23 1158 02/15/23 1202   02/13/23 1200  piperacillin -tazobactam (ZOSYN ) IVPB 3.375 g  Status:  Discontinued        3.375 g 12.5 mL/hr over 240 Minutes Intravenous Every 8 hours 02/13/23 0918 02/15/23 0916    02/12/23 1230  amoxicillin -clavulanate (AUGMENTIN ) 875-125 MG per tablet 1 tablet  Status:  Discontinued        1 tablet Oral Every 12 hours 02/12/23 1131 02/13/23 0902   02/10/23 1400  piperacillin -tazobactam (ZOSYN ) IVPB 3.375 g  Status:  Discontinued        3.375 g 12.5 mL/hr over 240 Minutes Intravenous Every 8 hours 02/10/23 1205 02/12/23 1131   02/10/23 1000  amoxicillin -clavulanate (AUGMENTIN ) 875-125 MG per tablet 1 tablet  Status:  Discontinued        1 tablet Oral Every 12 hours 02/10/23 0809 02/10/23 1205   02/09/23 1700  piperacillin -tazobactam (ZOSYN ) IVPB 3.375 g        3.375 g 12.5 mL/hr over 240 Minutes Intravenous  Once 02/09/23 1652 02/09/23 2145       Assessment/Plan: s/p Procedure(s): EXPLORATORY LAPAROTOMY (N/A) LYSIS OF ADHESIONS (N/A)  POD#2  -now on TNA for malnutrition and prolonged ileus. -continue NG.  May be able to clamp it tomorrow  LOS: 7 days    Vicenta Poli 02/17/2023

## 2023-02-17 NOTE — Progress Notes (Signed)
 PHARMACY - TOTAL PARENTERAL NUTRITION CONSULT NOTE   Indication: Prolonged ileus  Patient Measurements: Height: 5' 3 (160 cm) Weight: 53.5 kg (118 lb) IBW/kg (Calculated) : 52.4 TPN AdjBW (KG): 53.5 Body mass index is 20.9 kg/m.  Assessment: 89 year old female with small bowel obstruction s/p ex lap and lysis of adhesions. Patient with prolonged lack of nutrition. Has NGT in place, currently to suction. Plan for TPN.  Glucose / Insulin : CGB 123 this am, 1u SSI Electrolytes: K 3.3, phos 1.8, corrCa 9.1 Renal: stable Hepatic: WNL; alb 2.7 Intake / Output; MIVF:  -NG output 100 ml so far from yesterday am -LBM documented 1/3 GI Imaging: 1/3 Abd xray - ongoing small bowel obstruction with unchanged diffuse gaseous distension of small bowel GI Surgeries / Procedures:  1/3 ex lap with lysis of adhesions  Central access: PICC placed 1/4 TPN start date: 1/4   Nutritional Goals: Goal TPN rate is 70 mL/hr (provides 67 g of protein and 1397 kcals per day)  RD Assessment:  Kcal 1300-1500 kcal/day Protein 65-75 g/day Fluid 1.3-1.5 L/day  Current Nutrition: NPO  Plan:  -Potassium phos 30 mmol IV x 1 -Continue TPN at 35 mL/hr - defer advancing to goal given low electrolytes and risk for refeeding -Electrolytes in TPN: Na 50 mEq/L, K 50 mEq/L, Ca 5 mEq/L, Mg 5 mEq/L, and Phos 15 mmol/L. Cl:Ac 1:1 -Add standard MVI and trace elements to TPN -Thiamine  x 5 days for refeeding risk -Continue Sensitive q8h SSI for now -Monitor TPN labs on Mon/Thurs, daily for now to monitor for refeeding syndrome  Stefano MARLA Bologna, PharmD, BCPS Clinical Pharmacist 02/17/2023 11:17 AM

## 2023-02-17 NOTE — Progress Notes (Signed)
 PROGRESS NOTE    Candice Brown  FMW:[id number] DOB: 1931/02/13 DOA: 02/09/2023 PCP: Shepard Ade, MD   Brief Narrative:  89 year old PMH significant for hyperlipidemia, GERD, presented to the ED presented to hospital with abdominal pain, nausea vomiting and constipation for the last 24 hours.  She has a past medical history of hysterectomy, appendectomy and cholecystectomy.  CT abdomen and pelvis concern for short segment abdominal small bowel right lower quadrant regional inflammatory  edematous changes in the mesentery a small volume ascites.  Consideration for localized infectious, inflammatory or ischemic enteritis.  Patient was started on IV Zosyn  and was admitted to hospital for further evaluation and treatment.  During hospitalization, patient failed to improve repeated CT abdomen and pelvis 1/1 showed finding consistent with SBO.  General surgery consulted.  NG tube placed, small bowel protocol was started.  Patient did not improve so subsequently underwent surgical intervention 02/14/2022.  At this time, patient has been started on TPN for possible prolonged ileus, general surgery following.  Assessment & Plan:   Principal Problem:   SBO (small bowel obstruction) - closed loop - s/p exlap/lysis of adhesions 02/15/2023 Active Problems:   Malignant neoplasm of upper-outer quadrant of right breast in female, estrogen receptor positive (HCC)   Enteritis   HLD (hyperlipidemia)   GERD (gastroesophageal reflux disease)   Gastroenteritis  Small bowel obstruction. Initially CT of the abdomen finding concerning for enteritis.  She was initially treated conservatively with IV fluids antibiotic but unimproved so repeat CT scan was done which showed small bowel obstruction.    General surgery recommended surgical intervention and patient subsequently underwent exploratory laparotomy with lysis of adhesions on 02/15/2023.  Currently on NG tube and conservative treatment.   Due to anticipation for  following ileus patient has been started on TPN.  Will continue to monitor and follow surgical recommendation.  Hyperlipidemia On Crestor  as outpatient currently on hold.  History of breast cancer Will follow-up as outpatient.  GERD: PPI IV.  Hyponatremia: Latest sodium 137.  Improved with IV fluids.  Hypokalemia Will continue to replenish again today.  Check levels in AM.  Hypophosphatemia.  Will replenish with potassium phosphate .  Check levels in AM.   DVT prophylaxis: Lovenox   Code Status: Full code  Family Communication:  Spoke with the patient's daughter at bedside on 02/15/2023  Disposition Plan: Uncertain at this time, PT evaluation pending  Status is: Inpatient Remains inpatient appropriate because: Postoperative bowel obstruction, pending clinical improvement, on TPN  Consultants:  General surgery  Procedures:  NG tube placement exploratory laparotomy with lysis of adhesions on 02/15/2023. Right upper extremity PICC line placement on 02/16/2023  Antimicrobials:  None  Subjective: Today, patient was seen and examined at bedside.  Patient stated that she had no nausea or vomiting.  Has mild abdominal discomfort.  Has not passed gas or had a bowel movement.       Objective: Vitals:   02/17/23 0200 02/17/23 0400 02/17/23 0427 02/17/23 0600  BP:   138/63   Pulse:   69   Resp: 15 16 16 15   Temp:   98.5 F (36.9 C)   TempSrc:      SpO2:  94% 92%   Weight:      Height:        Intake/Output Summary (Last 24 hours) at 02/17/2023 1057 Last data filed at 02/17/2023 0800 Gross per 24 hour  Intake 922.17 ml  Output 1050 ml  Net -127.83 ml   [employer]  02/09/23 1111 02/15/23 1221  Weight: 53.5 kg 53.5 kg    Physical examination:  Body mass index is 20.9 kg/m.   General:  Average built, not in obvious distress, elderly female, NG tube in place HENT:   No scleral pallor or icterus noted. Oral mucosa is moist.,  NG tube in place  Chest:   Diminished  breath sounds bilaterally. No crackles or wheezes.  CVS: S1 &S2 heard. No murmur.  Regular rate and rhythm. Abdomen:  incisional tenderness noted, mild distention, honeycomb dressing noted. Extremities: No cyanosis, clubbing or edema.  Peripheral pulses are palpable. Psych: Alert, awake and oriented, normal mood CNS:  No cranial nerve deficits.  Power equal in all extremities.   Skin: Warm and dry.  No rashes noted.  Data Reviewed: I have personally reviewed following labs and imaging studies  CBC: Recent Labs  Lab 02/11/23 0503 02/12/23 0523 02/14/23 0534 02/15/23 0538 02/16/23 0704  WBC 5.4 8.1 9.2 9.5 9.8  HGB 13.4 14.0 13.6 13.4 12.5  HCT 41.7 42.9 41.5 39.7 37.9  MCV 103.0* 100.0 101.0* 98.5 100.0  PLT 280 315 298 327 312   Basic Metabolic Panel: Recent Labs  Lab 02/13/23 0532 02/14/23 0534 02/15/23 0538 02/16/23 0704 02/17/23 0337  NA 133* 132* 137 135 137  K 3.4* 4.1 3.7 3.5 3.3*  CL 97* 99 99 102 103  CO2 27 22 26 25 26   GLUCOSE 98 97 97 77 105*  BUN 22 21 24* 23 20  CREATININE 0.80 0.69 0.76 0.64 0.58  CALCIUM  8.9 8.9 9.3 8.2* 8.1*  MG  --  2.2  --  2.1 2.1  PHOS  --   --   --  2.6 1.8*   GFR: Estimated Creatinine Clearance: 39.4 mL/min (by C-G formula based on SCr of 0.58 mg/dL). Liver Function Tests: Recent Labs  Lab 02/17/23 0337  AST 23  ALT 19  ALKPHOS 30*  BILITOT 0.6  PROT 5.2*  ALBUMIN  2.7*   No results for input(s): LIPASE, AMYLASE in the last 168 hours.  No results for input(s): AMMONIA in the last 168 hours. Coagulation Profile: No results for input(s): INR, PROTIME in the last 168 hours. Cardiac Enzymes: No results for input(s): CKTOTAL, CKMB, CKMBINDEX, TROPONINI in the last 168 hours. BNP (last 3 results) No results for input(s): PROBNP in the last 8760 hours. HbA1C: No results for input(s): HGBA1C in the last 72 hours. CBG: Recent Labs  Lab 02/17/23 0759  GLUCAP 123*   Lipid Profile: No results for  input(s): CHOL, HDL, LDLCALC, TRIG, CHOLHDL, LDLDIRECT in the last 72 hours. Thyroid  Function Tests: No results for input(s): TSH, T4TOTAL, FREET4, T3FREE, THYROIDAB in the last 72 hours. Anemia Panel: No results for input(s): VITAMINB12, FOLATE, FERRITIN, TIBC, IRON, RETICCTPCT in the last 72 hours.  Sepsis Labs: No results for input(s): PROCALCITON, LATICACIDVEN in the last 168 hours.   Recent Results (from the past 240 hours)  Blood culture (routine x 2)     Status: None   Collection Time: 02/09/23  5:30 PM   Specimen: BLOOD  Result Value Ref Range Status   Specimen Description   Final    BLOOD LEFT ANTECUBITAL Performed at [facility], [address], [place], [place] [address]    Special Requests   Final    BOTTLES DRAWN AEROBIC AND ANAEROBIC Blood Culture adequate volume Performed at [facility], [address], [place], [place] [address]    Culture   Final  NO GROWTH 5 DAYS Performed at [facility], [address] [place], [place] [address]    Report Status 02/14/2023 FINAL  Final  Blood culture (routine x 2)     Status: None   Collection Time: 02/09/23  5:45 PM   Specimen: BLOOD  Result Value Ref Range Status   Specimen Description   Final    BLOOD LEFT ANTECUBITAL Performed at [facility], [address] [place], [place] [address]    Special Requests   Final    BOTTLES DRAWN AEROBIC AND ANAEROBIC Blood Culture results may not be optimal due to an inadequate volume of blood received in culture bottles Performed at [facility], [address], [place], [place] [address]    Culture   Final    NO GROWTH 5 DAYS Performed at [facility], [address] [place], [place] [address]    Report Status 02/14/2023 FINAL  Final  MRSA Next Gen by PCR, Nasal     Status: None   Collection Time: 02/09/23  9:06 PM   Specimen: Nasal Mucosa; Nasal  Swab  Result Value Ref Range Status   MRSA by PCR Next Gen NOT DETECTED NOT DETECTED Final    Comment: (NOTE) The GeneXpert MRSA Assay (FDA approved for NASAL specimens only), is one component of a comprehensive MRSA colonization surveillance program. It is not intended to diagnose MRSA infection nor to guide or monitor treatment for MRSA infections. Test performance is not FDA approved in patients less than 68 years old. Performed at [facility], [address] [place], [place] [address]      Radiology Studies: DG Abd Portable 1V Result Date: 02/16/2023 CLINICAL DATA:  Follow-up small bowel obstruction. EXAM: PORTABLE ABDOMEN - 1 VIEW COMPARISON:  02/16/2023 FINDINGS: Enteric tube tip and side port are below the GE junction. Surgical clips noted in the right upper quadrant. Dilated small bowel loops within the central abdomen measure up to 3.4 cm on the current exam. Previously these measured up to 4.8 cm. Enteric contrast material is identified within the proximal ascending colon and descending colon. IMPRESSION: Improving small bowel obstruction. Enteric contrast material is identified within the proximal ascending colon and descending colon. Electronically Signed   By: Waddell Calk M.D.   On: 02/16/2023 10:45   US  EKG SITE RITE Result Date: 02/16/2023 If Site Rite image not attached, placement could not be confirmed due to current cardiac rhythm.  DG Chest Port 1 View Result Date: 02/16/2023 CLINICAL DATA:  89 year old female status post nasogastric tube placement. EXAM: PORTABLE CHEST 1 VIEW COMPARISON:  Chest x-ray 01/19/2016. FINDINGS: Nasogastric tube extends into the proximal stomach with side port just distal to the gastroesophageal junction. Lung volumes are low. No consolidative airspace disease. No pleural effusions. No pneumothorax. No pulmonary nodule or mass noted. Pulmonary vasculature and the cardiomediastinal silhouette are within normal limits.  Atherosclerosis in the thoracic aorta. Surgical clips project over the right upper quadrant of the abdomen, likely from prior cholecystectomy. Gaseous distention is noted in the upper abdomen, including what appear to be dilated loops of small bowel, similar to prior studies. IMPRESSION: 1. Support apparatus, as above. 2. Low lung volumes without radiographic evidence of acute cardiopulmonary disease. 3. Aortic atherosclerosis. 4. Dilated loops of small bowel in the upper abdomen suggestive of underlying small bowel obstruction, similar to prior abdominal radiographs. Electronically Signed   By: Toribio Aye M.D.   On: 02/16/2023 05:43  Scheduled Meds:  Chlorhexidine  Gluconate Cloth  6 each Topical Daily   enoxaparin  (LOVENOX ) injection  40 mg Subcutaneous Q24H   insulin  aspart  0-9 Units Subcutaneous Q8H   pantoprazole  (PROTONIX ) IV  40 mg Intravenous Q12H   pregabalin   75 mg Oral Daily   thiamine  (VITAMIN B1) injection  100 mg Intravenous Daily   Continuous Infusions:  potassium PHOSPHATE  30 mmol in dextrose  5 % 250 mL infusion 30 mmol (02/17/23 0803)   TPN ADULT (ION) 35 mL/hr at 02/17/23 0700     LOS: 7 days    Vernal Alstrom, MD [facility] If 7PM-7AM, please contact night-coverage [email or web address]  02/17/2023, 10:57 AM

## 2023-02-17 NOTE — Evaluation (Signed)
 Physical Therapy Evaluation Patient Details Name: Candice Brown MRN: [id number] DOB: 06-25-1931 Today's Date: 02/17/2023  History of Present Illness  89 year old admitted with SBO and s/p exlap/lysis of adhesions 02/15/2023.  PMH significant for hyperlipidemia, GERD, hiatal hernia, diverticulosis, occipital neuralgia, peripheral neuropathy, hysterectomy, appendectomy and cholecystectomy.  Clinical Impression  Pt admitted with above diagnosis.  Pt currently with functional limitations due to the deficits listed below (see PT Problem List). Pt will benefit from acute skilled PT to increase their independence and safety with mobility to allow discharge.  Pt reports she is from [facility] and typically ambulatory to dining hall without assistive device.  Pt reports she is active in ballroom dancing and hopes to return after recovery.  Pt will likely need some assistance with mobilizing and ADLs upon d/c and feels [facility] will place her in ALF or rehab section upon her return until she is able to return to previous independence.  Patient will benefit from continued inpatient follow up therapy, <3 hours/day.         If plan is discharge home, recommend the following: A little help with walking and/or transfers;A little help with bathing/dressing/bathroom;Assistance with cooking/housework   Can travel by private vehicle        Equipment Recommendations None recommended by PT  Recommendations for Other Services       Functional Status Assessment Patient has had a recent decline in their functional status and demonstrates the ability to make significant improvements in function in a reasonable and predictable amount of time.     Precautions / Restrictions Precautions Precautions: Fall Precaution Comments: NG tube      Mobility  Bed Mobility Overal bed mobility: Needs Assistance Bed Mobility: Rolling, Sidelying to Sit Rolling: Min assist Sidelying to sit: Mod assist        General bed mobility comments: educated on log roll technique due to abdominal surgery; assist for completing roll and bringing trunk upright due to pain; pt educated on bracing abdomen with pillow    Transfers Overall transfer level: Needs assistance Equipment used: Rolling walker (2 wheels) Transfers: Sit to/from Stand Sit to Stand: Mod assist           General transfer comment: attempted to stand once but increased pain and requested assist, mod assist on second attempt, cues for hand placement and use of legs, pt also preferred to brace pillow against abdomen with other hand    Ambulation/Gait Ambulation/Gait assistance: Contact guard assist Gait Distance (Feet): 350 Feet Assistive device: Rolling walker (2 wheels) Gait Pattern/deviations: Step-through pattern, Decreased stride length       General Gait Details: a couple pausing in ambulation to stand fully erect posture; distance to tolerance  Stairs            Wheelchair Mobility     Tilt Bed    Modified Rankin (Stroke Patients Only)       Balance                                             Pertinent Vitals/Pain Pain Assessment Pain Assessment: Faces Faces Pain Scale: Hurts whole lot Pain Location: surgical site Pain Descriptors / Indicators: Grimacing, Guarding Pain Intervention(s): Repositioned, Monitored during session    Home Living   Living Arrangements: Alone   Type of Home: Independent living facility  Home Layout: One level Home Equipment: [employer] (2 wheels)      Prior Function Prior Level of Function : Independent/Modified Independent             Mobility Comments: uses walker sometimes in the morning, but able to ambulate to dining hall without assistive device, active at [employer] ballroom dancing       Extremity/Trunk Assessment   Upper Extremity Assessment Upper Extremity Assessment: Overall WFL for tasks assessed     Lower Extremity Assessment Lower Extremity Assessment: Overall WFL for tasks assessed    Cervical / Trunk Assessment Cervical / Trunk Assessment: Normal  Communication   Communication Communication: No apparent difficulties  Cognition Arousal: Alert Behavior During Therapy: WFL for tasks assessed/performed Overall Cognitive Status: Within Functional Limits for tasks assessed                                          General Comments      Exercises     Assessment/Plan    PT Assessment Patient needs continued PT services  PT Problem List Decreased mobility;Decreased activity tolerance;Decreased knowledge of use of DME;Pain       PT Treatment Interventions DME instruction;Gait training;Functional mobility training;Therapeutic activities;Therapeutic exercise;Patient/family education    PT Goals (Current goals can be found in the Care Plan section)  Acute Rehab PT Goals PT Goal Formulation: With patient Time For Goal Achievement: 03/03/23 Potential to Achieve Goals: Good    Frequency Min 1X/week     Co-evaluation               AM-PAC PT 6 Clicks Mobility  Outcome Measure Help needed turning from your back to your side while in a flat bed without using bedrails?: A Lot Help needed moving from lying on your back to sitting on the side of a flat bed without using bedrails?: A Lot Help needed moving to and from a bed to a chair (including a wheelchair)?: A Lot Help needed standing up from a chair using your arms (e.g., wheelchair or bedside chair)?: A Lot Help needed to walk in hospital room?: A Little Help needed climbing 3-5 steps with a railing? : A Lot 6 Click Score: 13    End of Session   Activity Tolerance: Patient tolerated treatment well Patient left: in chair;with call bell/phone within reach (agreeable to call for assist) Nurse Communication: Mobility status PT Visit Diagnosis: Difficulty in walking, not elsewhere classified  (R26.2)    Time: 8573-8547 PT Time Calculation (min) (ACUTE ONLY): 26 min   Charges:   PT Evaluation $PT Eval Low Complexity: 1 Low PT Treatments $Gait Training: 8-22 mins PT General Charges $$ ACUTE PT VISIT: 1 Visit        Tari KLEIN, DPT Physical Therapist Acute Rehabilitation Services Office: [phone]   Tari CROME [place] 02/17/2023, 4:19 PM

## 2023-02-17 NOTE — Plan of Care (Signed)
  Problem: Education: Goal: Knowledge of General Education information will improve Description: Including pain rating scale, medication(s)/side effects and non-pharmacologic comfort measures Outcome: Progressing   Problem: Health Behavior/Discharge Planning: Goal: Ability to manage health-related needs will improve Outcome: Progressing   Problem: Clinical Measurements: Goal: Ability to maintain clinical measurements within normal limits will improve Outcome: Progressing Goal: Will remain free from infection Outcome: Progressing Goal: Diagnostic test results will improve Outcome: Progressing Goal: Respiratory complications will improve Outcome: Progressing   Problem: Activity: Goal: Risk for activity intolerance will decrease Outcome: Progressing   Problem: Nutrition: Goal: Adequate nutrition will be maintained Outcome: Progressing   Problem: Coping: Goal: Level of anxiety will decrease Outcome: Progressing   Problem: Elimination: Goal: Will not experience complications related to bowel motility Outcome: Progressing Goal: Will not experience complications related to urinary retention Outcome: Progressing   Problem: Pain Management: Goal: General experience of comfort will improve Outcome: Progressing   Problem: Safety: Goal: Ability to remain free from injury will improve Outcome: Progressing   Problem: Skin Integrity: Goal: Risk for impaired skin integrity will decrease Outcome: Progressing

## 2023-02-18 DIAGNOSIS — K56609 Unspecified intestinal obstruction, unspecified as to partial versus complete obstruction: Secondary | ICD-10-CM

## 2023-02-18 LAB — CBC
HCT: 35.3 % — ABNORMAL LOW (ref 36.0–46.0)
Hemoglobin: 12.1 g/dL (ref 12.0–15.0)
MCH: 34 pg (ref 26.0–34.0)
MCHC: 34.3 g/dL (ref 30.0–36.0)
MCV: 99.2 fL (ref 80.0–100.0)
Platelets: 288 10*3/uL (ref 150–400)
RBC: 3.56 MIL/uL — ABNORMAL LOW (ref 3.87–5.11)
RDW: 12.2 % (ref 11.5–15.5)
WBC: 9 10*3/uL (ref 4.0–10.5)
nRBC: 0 % (ref 0.0–0.2)

## 2023-02-18 LAB — COMPREHENSIVE METABOLIC PANEL
ALT: 19 U/L (ref 0–44)
AST: 24 U/L (ref 15–41)
Albumin: 2.8 g/dL — ABNORMAL LOW (ref 3.5–5.0)
Alkaline Phosphatase: 40 U/L (ref 38–126)
Anion gap: 7 (ref 5–15)
BUN: 15 mg/dL (ref 8–23)
CO2: 25 mmol/L (ref 22–32)
Calcium: 8.3 mg/dL — ABNORMAL LOW (ref 8.9–10.3)
Chloride: 104 mmol/L (ref 98–111)
Creatinine, Ser: 0.5 mg/dL (ref 0.44–1.00)
GFR, Estimated: >60 mL/min (ref >60)
Glucose, Bld: 126 mg/dL — ABNORMAL HIGH (ref 70–99)
Potassium: 3.8 mmol/L (ref 3.5–5.1)
Sodium: 136 mmol/L (ref 135–145)
Total Bilirubin: 0.6 mg/dL (ref 0.0–1.2)
Total Protein: 5.4 g/dL — ABNORMAL LOW (ref 6.5–8.1)

## 2023-02-18 LAB — GLUCOSE, CAPILLARY
Glucose-Capillary: 148 mg/dL — ABNORMAL HIGH (ref 70–99)
Glucose-Capillary: 154 mg/dL — ABNORMAL HIGH (ref 70–99)
Glucose-Capillary: 148 mg/dL — ABNORMAL HIGH (ref 70–99)

## 2023-02-18 LAB — PHOSPHORUS: Phosphorus: 2.5 mg/dL (ref 2.5–4.6)

## 2023-02-18 LAB — TRIGLYCERIDES: Triglycerides: 97 mg/dL (ref <150)

## 2023-02-18 LAB — MAGNESIUM: Magnesium: 2.2 mg/dL (ref 1.7–2.4)

## 2023-02-18 MED ORDER — TRAVASOL 10 % IV SOLN
INTRAVENOUS | Status: AC
  Filled 2023-02-18: qty 720

## 2023-02-18 MED ORDER — ACETAMINOPHEN 10 MG/ML IV SOLN
1000.0000 mg | Freq: Four times a day (QID) | INTRAVENOUS | Status: AC
  Administered 2023-02-18 – 2023-02-19 (×4): 1000 mg via INTRAVENOUS
  Filled 2023-02-18 (×4): qty 100

## 2023-02-18 MED ORDER — INSULIN ASPART 100 UNIT/ML IJ SOLN
0.0000 [IU] | Freq: Four times a day (QID) | INTRAMUSCULAR | Status: DC
  Administered 2023-02-18: 2 [IU] via SUBCUTANEOUS
  Administered 2023-02-18: 1 [IU] via SUBCUTANEOUS
  Administered 2023-02-19: 2 [IU] via SUBCUTANEOUS
  Administered 2023-02-19 (×3): 1 [IU] via SUBCUTANEOUS

## 2023-02-18 MED ORDER — METHOCARBAMOL 1000 MG/10ML IJ SOLN
1000.0000 mg | Freq: Four times a day (QID) | INTRAMUSCULAR | Status: DC | PRN
  Filled 2023-02-18: qty 10

## 2023-02-18 NOTE — Plan of Care (Signed)
  Problem: Education: Goal: Knowledge of General Education information will improve Description: Including pain rating scale, medication(s)/side effects and non-pharmacologic comfort measures Outcome: Progressing   Problem: Health Behavior/Discharge Planning: Goal: Ability to manage health-related needs will improve Outcome: Progressing   Problem: Clinical Measurements: Goal: Ability to maintain clinical measurements within normal limits will improve Outcome: Progressing Goal: Will remain free from infection Outcome: Progressing Goal: Diagnostic test results will improve Outcome: Progressing Goal: Respiratory complications will improve Outcome: Progressing   Problem: Activity: Goal: Risk for activity intolerance will decrease Outcome: Progressing   Problem: Nutrition: Goal: Adequate nutrition will be maintained Outcome: Progressing   Problem: Coping: Goal: Level of anxiety will decrease Outcome: Progressing   Problem: Elimination: Goal: Will not experience complications related to bowel motility Outcome: Progressing Goal: Will not experience complications related to urinary retention Outcome: Progressing   Problem: Pain Management: Goal: General experience of comfort will improve Outcome: Progressing   Problem: Safety: Goal: Ability to remain free from injury will improve Outcome: Progressing   Problem: Skin Integrity: Goal: Risk for impaired skin integrity will decrease Outcome: Progressing   Problem: Education: Goal: Ability to describe self-care measures that may prevent or decrease complications (Diabetes Survival Skills Education) will improve Outcome: Progressing Goal: Individualized Educational Video(s) Outcome: Progressing   Problem: Coping: Goal: Ability to adjust to condition or change in health will improve Outcome: Progressing   Problem: Fluid Volume: Goal: Ability to maintain a balanced intake and output will improve Outcome: Progressing    Problem: Health Behavior/Discharge Planning: Goal: Ability to identify and utilize available resources and services will improve Outcome: Progressing Goal: Ability to manage health-related needs will improve Outcome: Progressing   Problem: Metabolic: Goal: Ability to maintain appropriate glucose levels will improve Outcome: Progressing   Problem: Nutritional: Goal: Maintenance of adequate nutrition will improve Outcome: Progressing Goal: Progress toward achieving an optimal weight will improve Outcome: Progressing   Problem: Skin Integrity: Goal: Risk for impaired skin integrity will decrease Outcome: Progressing   Problem: Tissue Perfusion: Goal: Adequacy of tissue perfusion will improve Outcome: Progressing

## 2023-02-18 NOTE — Progress Notes (Signed)
 PHARMACY - TOTAL PARENTERAL NUTRITION CONSULT NOTE   Indication: Prolonged ileus  Patient Measurements: Height: 5' 3 (160 cm) Weight: 55.6 kg (122 lb 9.6 oz) IBW/kg (Calculated) : 52.4 TPN AdjBW (KG): 53.5 Body mass index is 21.72 kg/m.  Assessment: 89 year old female with small bowel obstruction s/p ex lap and lysis of adhesions. Patient with prolonged lack of nutrition. Has NGT in place, currently to suction. Plan for TPN.  Glucose / Insulin : CBGs trending up to 120-150s with 4 units of SSI in last 24 hours. Electrolytes: lytes wnl today after replacement over the weekend Renal: stable Hepatic: LFTs and tbili wnl; alb 2.8. Trig wnl Intake / Output; MIVF:  -NG output yesterday -LBM documented 1/3 GI Imaging: 1/3 Abd xray - ongoing small bowel obstruction with unchanged diffuse gaseous distension of small bowel 1/4 Abd xray shows improving small bowel obstruction GI Surgeries / Procedures:  1/3 ex lap with lysis of adhesions  Central access: PICC placed 1/4 TPN start date: 1/4   Nutritional Goals: Goal TPN rate is 60 mL/hr (provides 72 g of protein and 1361 kcals per day)  RD Assessment:  Kcal 1300-1500 kcal/day Protein 65-75 g/day Fluid 1.3-1.5 L/day  Current Nutrition: NPO  Plan:  Increase TPN to goal at 60 mL/hr Electrolytes in TPN: Na 50 mEq/L, K 50 mEq/L, Ca 5 mEq/L, Mg 5 mEq/L, and Phos 15 mmol/L. Cl:Ac 1:1 Add standard MVI and trace elements to TPN Thiamine  x 5 days for refeeding risk Change to Sensitive q6h SSI and adjust as needed Monitor TPN labs on Mon/Thurs, Bmet tomorrow  [place]in Dee, PharmD, BCPS, BCIDP Clinical Pharmacist 02/18/2023 6:59 AM

## 2023-02-18 NOTE — Hospital Course (Addendum)
 89 year old PMH significant for hyperlipidemia, GERD, presented to the ED presented to hospital with abdominal pain, nausea vomiting and constipation for the last 24 hours.  She has a past medical history of hysterectomy, appendectomy and cholecystectomy.  CT abdomen and pelvis concern for short segment abdominal small bowel right lower quadrant regional inflammatory  edematous changes in the mesentery a small volume ascites.  Consideration for localized infectious, inflammatory or ischemic enteritis.  Patient was started on IV Zosyn  and was admitted to hospital for further evaluation and treatment.  During hospitalization, patient failed to improve repeated CT abdomen and pelvis 1/1 showed finding consistent with SBO.  General surgery consulted.  NG tube placed, small bowel protocol was started.  Patient did not improve so subsequently underwent surgical intervention 02/14/2022. CCS following, has been started on TPN  1/7-1/8> patient having vomiting, underwent NG tube placement 1/8 evening> subsequently removed next day, bowel movement improved no more diarrhea, diet advanced and tolerating.  At this time pain is well-controlled.  Surgery has signed off and cleared the patient for discharge to skilled nursing facility as of 1/12.

## 2023-02-18 NOTE — Progress Notes (Signed)
 PROGRESS NOTE Candice Brown  FMW:[id number] DOB: 12/29/1931 DOA: 02/09/2023 PCP: Shepard Ade, MD  Brief Narrative/Hospital Course: 89 year old PMH significant for hyperlipidemia, GERD, presented to the ED presented to hospital with abdominal pain, nausea vomiting and constipation for the last 24 hours.  She has a past medical history of hysterectomy, appendectomy and cholecystectomy.  CT abdomen and pelvis concern for short segment abdominal small bowel right lower quadrant regional inflammatory  edematous changes in the mesentery a small volume ascites.  Consideration for localized infectious, inflammatory or ischemic enteritis.  Patient was started on IV Zosyn  and was admitted to hospital for further evaluation and treatment.  During hospitalization, patient failed to improve repeated CT abdomen and pelvis 1/1 showed finding consistent with SBO.  General surgery consulted.  NG tube placed, small bowel protocol was started.  Patient did not improve so subsequently underwent surgical intervention 02/14/2022. CCS following, has been started on TPN     Subjective: Patient seen and examined Complains of abdominal pain asking for pain medication No nausea vomiting fever chills no BM or flatus yet NG tube in place Overnight afebrile BP stable, not hypoxic. Labs reviewed CMP stable with albumin  2.8 CBC normal  Assessment and Plan: Principal Problem:   SBO (small bowel obstruction) - closed loop - s/p exlap/lysis of adhesions 02/15/2023 Active Problems:   Malignant neoplasm of upper-outer quadrant of right breast in female, estrogen receptor positive (HCC)   Enteritis   HLD (hyperlipidemia)   GERD (gastroesophageal reflux disease)   Gastroenteritis  SBO Ileus: Initial CT abdomen showed enteritis managed conservatively, repeat CT scan done as patient did not improve showing SBO, seen by cc S/P ex lap with LOA x 2 Dr. Cameron, Postop slowly improving, continue TPN, NG tube to LIWS and ARO BF,  mobilize as able on IV Tylenol  IV Robaxin  for better pain control, keep K above 4 mag above 2.   Hyperlipidemia PTA and Crestor  currently on hold    History of breast cancer Will follow-up as outpatient.   GERD: Continue PPI iv   Hyponatremia: Resolved.    Hypophosphatemia Hypokalemia: Resolved.   DVT prophylaxis: enoxaparin  (LOVENOX ) injection 40 mg Start: 02/12/23 2200 SCDs Start: 02/09/23 1825 Code Status:   Code Status: Full Code Family Communication: plan of care discussed with patient at bedside. Patient status is: Remains hospitalized because of severity of illness Level of care: Med-Surg   Dispo: The patient is from: ILF at [facility]            Anticipated disposition: TBD.  Objective: Vitals last 24 hrs: Vitals:   02/17/23 1242 02/17/23 1954 02/18/23 0455 02/18/23 0520  BP: (!) 149/56 (!) 145/61 132/69   Pulse: 66 70 82   Resp: 16 18 18    Temp: 98.3 F (36.8 C) 98.8 F (37.1 C) 98.4 F (36.9 C)   TempSrc:  Oral Oral   SpO2: 94% 94% 91%   Weight:    55.6 kg  Height:      Weight change:   Physical Examination: General exam: alert awake, pleasant  HEENT:Oral mucosa moist, Ear/Nose WNL grossly Respiratory system: Bilaterally clear BS,no use of accessory muscle Cardiovascular system: S1 & S2 +, No JVD. Gastrointestinal system: Abdomen soft, BM sluggish,NT,ND, honeycomb dressing+ on central surgical site  NGT+ Nervous System: Alert, awake, moving all extremities Extremities: LE edema neg,distal peripheral pulses palpable and warm.  Skin: No rashes,no icterus. MSK: Normal muscle bulk,tone, power.   Medications reviewed:  Scheduled Meds:  Chlorhexidine  Gluconate Cloth  6 each Topical Daily   enoxaparin  (LOVENOX ) injection  40 mg Subcutaneous Q24H   insulin  aspart  0-9 Units Subcutaneous Q6H   pantoprazole  (PROTONIX ) IV  40 mg Intravenous Q12H   pregabalin   75 mg Oral Daily   thiamine  (VITAMIN B1) injection  100 mg Intravenous Daily  Continuous  Infusions:  acetaminophen      TPN ADULT (ION) 35 mL/hr at 02/17/23 1737   TPN ADULT (ION)      Diet Order             Diet NPO time specified Except for: Ice Chips  Diet effective now                  Intake/Output Summary (Last 24 hours) at 02/18/2023 1115 Last data filed at 02/18/2023 0558 Gross per 24 hour  Intake --  Output 200 ml  Net -200 ml  Net IO Since Admission: 2,999.28 mL [02/18/23 1115]  Wt Readings from Last 3 Encounters:  02/18/23 55.6 kg  12/27/22 53.8 kg  06/05/22 52 kg    Unresulted Labs (From admission, onward)     Start     Ordered   02/19/23 0500  Basic metabolic panel  Tomorrow morning,   R       Question:  Specimen collection method  Answer:  IV Team=IV Team collect   02/18/23 0712   02/18/23 0713  Hemoglobin A1c  Once,   R       Comments: To assess prior glycemic control   Question:  Specimen collection method  Answer:  IV Team=IV Team collect   02/18/23 0713   02/18/23 0500  Comprehensive metabolic panel  (Standard TPN Labs (all labs to be drawn at 0500))  Every Mon,Thu (0500),   R     Question:  Specimen collection method  Answer:  Lab=Lab collect   02/16/23 0953   02/18/23 0500  Magnesium  (Standard TPN Labs (all labs to be drawn at 0500))  Every Mon,Thu (0500),   R     Question:  Specimen collection method  Answer:  Lab=Lab collect   02/16/23 0953   02/18/23 0500  Phosphorus  (Standard TPN Labs (all labs to be drawn at 0500))  Every Mon,Thu (0500),   R     Question:  Specimen collection method  Answer:  Lab=Lab collect   02/16/23 0953   02/18/23 0500  Triglycerides  (Standard TPN Labs (all labs to be drawn at 0500))  Every Monday (0500),   R     Question:  Specimen collection method  Answer:  Lab=Lab collect   02/16/23 0953          Data Reviewed: I have personally reviewed following labs and imaging studies CBC: Recent Labs  Lab 02/12/23 0523 02/14/23 0534 02/15/23 0538 02/16/23 0704 02/18/23 0419  WBC 8.1 9.2 9.5 9.8 9.0  HGB  14.0 13.6 13.4 12.5 12.1  HCT 42.9 41.5 39.7 37.9 35.3*  MCV 100.0 101.0* 98.5 100.0 99.2  PLT 315 298 327 312 288   Basic Metabolic Panel:  Recent Labs  Lab 02/14/23 0534 02/15/23 0538 02/16/23 0704 02/17/23 0337 02/18/23 0419  NA 132* 137 135 137 136  K 4.1 3.7 3.5 3.3* 3.8  CL 99 99 102 103 104  CO2 22 26 25 26 25   GLUCOSE 97 97 77 105* 126*  BUN 21 24* 23 20 15   CREATININE 0.69 0.76 0.64 0.58 0.50  CALCIUM  8.9 9.3 8.2* 8.1* 8.3*  MG 2.2  --  2.1  2.1 2.2  PHOS  --   --  2.6 1.8* 2.5   GFR: Estimated Creatinine Clearance: 39.4 mL/min (by C-G formula based on SCr of 0.5 mg/dL). Liver Function Tests:  Recent Labs  Lab 02/17/23 0337 02/18/23 0419  AST 23 24  ALT 19 19  ALKPHOS 30* 40  BILITOT 0.6 0.6  PROT 5.2* 5.4*  ALBUMIN  2.7* 2.8*   Recent Labs    02/18/23 0419  TRIG 97  No results for input(s): TSH, T4TOTAL, FREET4, T3FREE, THYROIDAB in the last 72 hours. Sepsis Labs: No results for input(s): PROCALCITON, LATICACIDVEN in the last 168 hours. Recent Results (from the past 240 hours)  Blood culture (routine x 2)     Status: None   Collection Time: 02/09/23  5:30 PM   Specimen: BLOOD  Result Value Ref Range Status   Specimen Description   Final    BLOOD LEFT ANTECUBITAL Performed at [facility], [address], [place], [place] [address]    Special Requests   Final    BOTTLES DRAWN AEROBIC AND ANAEROBIC Blood Culture adequate volume Performed at [facility], [address], [place], [place] [address]    Culture   Final    NO GROWTH 5 DAYS Performed at [facility], [address] [place], [place] [address]    Report Status 02/14/2023 FINAL  Final  Blood culture (routine x 2)     Status: None   Collection Time: 02/09/23  5:45 PM   Specimen: BLOOD  Result Value Ref Range Status   Specimen Description   Final    BLOOD LEFT ANTECUBITAL Performed at [facility], [address], [place], [place] [address]    Special Requests   Final    BOTTLES DRAWN AEROBIC AND ANAEROBIC Blood Culture results may not be optimal due to an inadequate volume of blood received in culture bottles Performed at [facility], [address] [place], [place] [address]    Culture   Final    NO GROWTH 5 DAYS Performed at [facility], [address] [place], [place] [address]    Report Status 02/14/2023 FINAL  Final  MRSA Next Gen by PCR, Nasal     Status: None   Collection Time: 02/09/23  9:06 PM   Specimen: Nasal Mucosa; Nasal Swab  Result Value Ref Range Status   MRSA by PCR Next Gen NOT DETECTED NOT DETECTED Final    Comment: (NOTE) The GeneXpert MRSA Assay (FDA approved for NASAL specimens only), is one component of a comprehensive MRSA colonization surveillance program. It is not intended to diagnose MRSA infection nor to guide or monitor treatment for MRSA infections. Test performance is not FDA approved in patients less than 38 years old. Performed at [facility], [address] [place], [place] [address]   Antimicrobials/Microbiology: Anti-infectives (From admission, onward)    Start     Dose/Rate Route Frequency Ordered Stop   02/16/23 0600  ceFAZolin  (ANCEF ) IVPB 2g/100 mL premix        2 g 200 mL/hr over 30 Minutes Intravenous On call to O.R. 02/15/23 1124 02/15/23 1313   02/15/23 1158  piperacillin -tazobactam (ZOSYN ) 3.375 GM/50ML IVPB  Status:  Discontinued       Note to Pharmacy: Gaines, Janel R: cabinet override      02/15/23 1158 02/15/23 1202   02/13/23 1200  piperacillin -tazobactam (ZOSYN ) IVPB 3.375 g  Status:  Discontinued  3.375 g 12.5 mL/hr over 240 Minutes Intravenous Every 8 hours 02/13/23 0918 02/15/23 0916   02/12/23 1230  amoxicillin -clavulanate (AUGMENTIN ) 875-125 MG per tablet 1 tablet  Status:  Discontinued        1 tablet Oral Every 12 hours 02/12/23 1131 02/13/23 0902   02/10/23 1400   piperacillin -tazobactam (ZOSYN ) IVPB 3.375 g  Status:  Discontinued        3.375 g 12.5 mL/hr over 240 Minutes Intravenous Every 8 hours 02/10/23 1205 02/12/23 1131   02/10/23 1000  amoxicillin -clavulanate (AUGMENTIN ) 875-125 MG per tablet 1 tablet  Status:  Discontinued        1 tablet Oral Every 12 hours 02/10/23 0809 02/10/23 1205   02/09/23 1700  piperacillin -tazobactam (ZOSYN ) IVPB 3.375 g        3.375 g 12.5 mL/hr over 240 Minutes Intravenous  Once 02/09/23 1652 02/09/23 2145         Component Value Date/Time   SDES  02/09/2023 1745    BLOOD LEFT ANTECUBITAL Performed at [facility], [address], [place], [place] [address]    SPECREQUEST  02/09/2023 1745    BOTTLES DRAWN AEROBIC AND ANAEROBIC Blood Culture results may not be optimal due to an inadequate volume of blood received in culture bottles Performed at [facility], [address], [place], [place] [address]    CULT  02/09/2023 1745    NO GROWTH 5 DAYS Performed at [facility], [address], [place], [place] [address]    REPTSTATUS 02/14/2023 FINAL 02/09/2023 1745     Radiology Studies: No results found.  LOS: 8 days   Total time spent in review of labs and imaging, patient evaluation, formulation of plan, documentation and communication with family: 35 minutes  Mennie LAMY, MD [facility]  02/18/2023, 11:15 AM

## 2023-02-18 NOTE — Progress Notes (Addendum)
 Nutrition Follow-up  DOCUMENTATION CODES:   Severe malnutrition in context of chronic illness  INTERVENTION:   Monitor magnesium, potassium, and phosphorus for at least 3 days, MD to replete as needed, as pt is at risk for refeeding syndrome. -Continue 100 mg Thiamine  x 5 days  -TPN management per Pharmacy  -Daily weights while on TPN  NUTRITION DIAGNOSIS:   Severe Malnutrition related to chronic illness as evidenced by severe fat depletion, severe muscle depletion.  GOAL:   Patient will meet greater than or equal to 90% of their needs  MONITOR:   Weight trends, Labs, I & O's, Diet advancement (TPN)  REASON FOR ASSESSMENT:   Consult New TPN/TNA  ASSESSMENT:   89 y/o female with h/o breat cancer s/p lumpectomy with radioactive seed localization (2023), HLD, GERD, diverticulitis, IBS, hiatal hernia and depression who is admitted with SBO with closed-loop obstruction (involving the mid to distal ileum) now s/p exploratory laparotomy with lysis of adhesions 1/3 complicated by post op ileus.  12/28: admitted ,regular diet 12/29: FLD 12/30: Soft diet 12/31: FLD 1/1: NPO 1/3: s/p ex lap, LOA  Pt continues with post-op ileus. TPN was initiated over the weekend. Given lack of nutrition since admission 12/28, will be at refeeding syndrome risk.  Pt states she was vomiting a lot when on a diet. She last ate a small cup of yogurt on 12/29.  Pt states she is from [facility] and receives most of her meals through them. She consumes 2 meals a day. Typically a late breakfast of toast, or smoked salmon or cold or hot cereals. Then she likes fish or chicken for dinner with pasta or other sides. Denies any issues with swallowing or chewing PTA.   NGT in place for suction, output at bedside noted: 350 ml.   TPN increasing tonight to 60 ml/hr, providing 1361 kcals and 72g protein.   Per patient  UBW ~118-120 lbs. Current weight: 122 lbs.  Medications: Thiamine   Labs  reviewed: CBGs: 123-158 TG 97   NUTRITION - FOCUSED PHYSICAL EXAM:  Flowsheet Row Most Recent Value  Orbital Region Severe depletion  Upper Arm Region Severe depletion  Thoracic and Lumbar Region Severe depletion  Buccal Region Severe depletion  Temple Region Severe depletion  Clavicle Bone Region Severe depletion  Clavicle and Acromion Bone Region Severe depletion  Scapular Bone Region Severe depletion  Dorsal Hand Severe depletion  Patellar Region Severe depletion  Anterior Thigh Region Severe depletion  Posterior Calf Region Severe depletion  Edema (RD Assessment) None  Hair Reviewed  Eyes Reviewed  Mouth Reviewed  Skin Reviewed  Nails Reviewed       Diet Order:   Diet Order             Diet NPO time specified Except for: Ice Chips  Diet effective now                   EDUCATION NEEDS:   No education needs have been identified at this time  Skin:  Skin Assessment: Skin Integrity Issues: Skin Integrity Issues:: Incisions Incisions: 1/3 abdomen  Last BM:  1/3  Height:   Ht Readings from Last 1 Encounters:  02/15/23 5' 3 (1.6 m)    Weight:   Wt Readings from Last 1 Encounters:  02/18/23 55.6 kg    BMI:  Body mass index is 21.72 kg/m.  Estimated Nutritional Needs:   Kcal:  1500-1700  Protein:  75-85g  Fluid:  1.7L/day  Morna Lee, MS, RD, LDN  Inpatient Clinical Dietitian Contact via Secure chat

## 2023-02-18 NOTE — TOC Progression Note (Addendum)
 Transition of Care [facility]) - Progression Note    Patient Details  Name: ALESANDRA SMART MRN: [id number] Date of Birth: 13-Dec-1931  [facility] CM/SW Contact  Hoy DELENA Bigness, LCSW Phone Number: 02/18/2023, 12:52 PM  Clinical Narrative:    Spoke with pt at bedside to discuss recommendation for SNF placement at discharge. Pt currently resides at [facility] IL[facility]. Pt is agreeable to go to [facility] at discharge if bed is available.  CSW left voicemail for Patty at [facility] to discuss discharge plans.   Update 2:00pm: Received return call from Bobetta who requests CSW contact Katie at [facility] to determine if they have bed availability at their location as pt would be able to use insurance coverage at this location. VM left for Izetta Pebbles at [facility].       Expected Discharge Plan and Services                                               Social Determinants of Health (SDOH) Interventions SDOH Screenings   Food Insecurity: No Food Insecurity (02/16/2023)  Housing: Low Risk  (02/16/2023)  Transportation Needs: No Transportation Needs (02/16/2023)  Utilities: Not At Risk (02/16/2023)  Social Connections: Socially Integrated (02/16/2023)  Stress: No Stress Concern Present (07/21/2021)  Tobacco Use: High Risk (02/15/2023)    Readmission Risk Interventions     No data to display

## 2023-02-18 NOTE — Progress Notes (Signed)
 Progress Note  3 Days Post-Op  Subjective: Pt reports generalized soreness from being in bed and incisional pain. No flatus or BM yet. Getting up and walking with nursing and her daughter. No family present at bedside this AM.   Objective: Vital signs in last 24 hours: Temp:  [98.3 F (36.8 C)-98.8 F (37.1 C)] 98.4 F (36.9 C) (01/06 0455) Pulse Rate:  [66-82] 82 (01/06 0455) Resp:  [16-18] 18 (01/06 0455) BP: (132-149)/(56-69) 132/69 (01/06 0455) SpO2:  [91 %-94 %] 91 % (01/06 0455) Weight:  [55.6 kg] 55.6 kg (01/06 0520) Last BM Date : 02/15/23  Intake/Output from previous day: 01/05 0701 - 01/06 0700 In: 0  Out: 550 [Urine:350; Emesis/NG output:200] Intake/Output this shift: No intake/output data recorded.  PE: General: pleasant, WD, elderly female who is laying in bed in NAD Heart: regular, rate, and rhythm. Lungs: Respiratory effort nonlabored Abd: soft, appropriately ttp, honeycomb to midline with small amount dried blood centrally, NGT with thin bilious drainage, mild distention, BS hypoactive  Psych: A&Ox3 with an appropriate affect.    Lab Results:  Recent Labs    02/16/23 0704 02/18/23 0419  WBC 9.8 9.0  HGB 12.5 12.1  HCT 37.9 35.3*  PLT 312 288   BMET Recent Labs    02/17/23 0337 02/18/23 0419  NA 137 136  K 3.3* 3.8  CL 103 104  CO2 26 25  GLUCOSE 105* 126*  BUN 20 15  CREATININE 0.58 0.50  CALCIUM  8.1* 8.3*   PT/INR No results for input(s): LABPROT, INR in the last 72 hours. CMP     Component Value Date/Time   NA 136 02/18/2023 0419   K 3.8 02/18/2023 0419   CL 104 02/18/2023 0419   CO2 25 02/18/2023 0419   GLUCOSE 126 (H) 02/18/2023 0419   BUN 15 02/18/2023 0419   CREATININE 0.50 02/18/2023 0419   CREATININE 0.86 11/18/2018 1035   CALCIUM  8.3 (L) 02/18/2023 0419   PROT 5.4 (L) 02/18/2023 0419   PROT 6.7 12/31/2018 1045   ALBUMIN  2.8 (L) 02/18/2023 0419   AST 24 02/18/2023 0419   ALT 19 02/18/2023 0419   ALKPHOS 40  02/18/2023 0419   BILITOT 0.6 02/18/2023 0419   GFRNONAA >60 02/18/2023 0419   GFRNONAA 62 11/18/2018 1035   GFRAA 71 11/18/2018 1035   Lipase     Component Value Date/Time   LIPASE 88 (H) 02/09/2023 1135       Studies/Results: No results found.  Anti-infectives: Anti-infectives (From admission, onward)    Start     Dose/Rate Route Frequency Ordered Stop   02/16/23 0600  ceFAZolin  (ANCEF ) IVPB 2g/100 mL premix        2 g 200 mL/hr over 30 Minutes Intravenous On call to O.R. 02/15/23 1124 02/15/23 1313   02/15/23 1158  piperacillin -tazobactam (ZOSYN ) 3.375 GM/50ML IVPB  Status:  Discontinued       Note to Pharmacy: Gaines, Janel R: cabinet override      02/15/23 1158 02/15/23 1202   02/13/23 1200  piperacillin -tazobactam (ZOSYN ) IVPB 3.375 g  Status:  Discontinued        3.375 g 12.5 mL/hr over 240 Minutes Intravenous Every 8 hours 02/13/23 0918 02/15/23 0916   02/12/23 1230  amoxicillin -clavulanate (AUGMENTIN ) 875-125 MG per tablet 1 tablet  Status:  Discontinued        1 tablet Oral Every 12 hours 02/12/23 1131 02/13/23 0902   02/10/23 1400  piperacillin -tazobactam (ZOSYN ) IVPB 3.375 g  Status:  Discontinued  3.375 g 12.5 mL/hr over 240 Minutes Intravenous Every 8 hours 02/10/23 1205 02/12/23 1131   02/10/23 1000  amoxicillin -clavulanate (AUGMENTIN ) 875-125 MG per tablet 1 tablet  Status:  Discontinued        1 tablet Oral Every 12 hours 02/10/23 0809 02/10/23 1205   02/09/23 1700  piperacillin -tazobactam (ZOSYN ) IVPB 3.375 g        3.375 g 12.5 mL/hr over 240 Minutes Intravenous  Once 02/09/23 1652 02/09/23 2145        Assessment/Plan  SBO POD3 s/p ex-lap with lysis of adhesive band x2 Dr. Signe - no bowel function, mild ileus which is expected - continue TPN and NGT to LIWS and AROBF - mobilize as able - reordered IV tylenol  and increased IV robaxin  to try and get pain better controlled  - goal K >4.0 and Mg >2.0 to optimize bowel function   FEN: NPO,  TPN, NGT to LIWS VTE: LMWH ID: no current abx   LOS: 8 days     Burnard JONELLE Louder, [facility] Surgery 02/18/2023, 9:59 AM Please see Amion for pager number during day hours 7:00am-4:30pm

## 2023-02-18 NOTE — Progress Notes (Signed)
 Mobility Specialist - Progress Note   02/18/23 1415  Mobility  Activity Ambulated with assistance in hallway;Transferred to/from [facility]  Level of Assistance Standby assist, set-up cues, supervision of patient - no hands on  Assistive Device Front wheel walker  Distance Ambulated (ft) 80 ft  Activity Response Tolerated well  Mobility Referral Yes  Mobility visit 1 Mobility  Mobility Specialist Start Time (ACUTE ONLY) 1348  Mobility Specialist Stop Time (ACUTE ONLY) 1414  Mobility Specialist Time Calculation (min) (ACUTE ONLY) 26 min   Pt received in bed and agreeable to mobility. Prior to ambulating, pt requested assistance to [facility]. No complaints during session. Pt to recliner after session with all needs met.    [facility]

## 2023-02-19 DIAGNOSIS — K56609 Unspecified intestinal obstruction, unspecified as to partial versus complete obstruction: Secondary | ICD-10-CM | POA: Diagnosis not present

## 2023-02-19 DIAGNOSIS — E43 Unspecified severe protein-calorie malnutrition: Secondary | ICD-10-CM | POA: Insufficient documentation

## 2023-02-19 LAB — BASIC METABOLIC PANEL
Anion gap: 8 (ref 5–15)
BUN: 19 mg/dL (ref 8–23)
CO2: 24 mmol/L (ref 22–32)
Calcium: 8.4 mg/dL — ABNORMAL LOW (ref 8.9–10.3)
Chloride: 104 mmol/L (ref 98–111)
Creatinine, Ser: 0.43 mg/dL — ABNORMAL LOW (ref 0.44–1.00)
GFR, Estimated: >60 mL/min (ref >60)
Glucose, Bld: 142 mg/dL — ABNORMAL HIGH (ref 70–99)
Potassium: 3.8 mmol/L (ref 3.5–5.1)
Sodium: 136 mmol/L (ref 135–145)

## 2023-02-19 LAB — HEMOGLOBIN A1C
Hgb A1c MFr Bld: 5.5 % (ref 4.8–5.6)
Mean Plasma Glucose: 111 mg/dL

## 2023-02-19 LAB — GLUCOSE, CAPILLARY
Glucose-Capillary: 138 mg/dL — ABNORMAL HIGH (ref 70–99)
Glucose-Capillary: 148 mg/dL — ABNORMAL HIGH (ref 70–99)
Glucose-Capillary: 149 mg/dL — ABNORMAL HIGH (ref 70–99)
Glucose-Capillary: 161 mg/dL — ABNORMAL HIGH (ref 70–99)

## 2023-02-19 MED ORDER — PROCHLORPERAZINE EDISYLATE 10 MG/2ML IJ SOLN
5.0000 mg | Freq: Four times a day (QID) | INTRAMUSCULAR | Status: DC | PRN
  Administered 2023-02-20 (×2): 5 mg via INTRAVENOUS
  Filled 2023-02-19 (×2): qty 2

## 2023-02-19 MED ORDER — LIDOCAINE 5 % EX PTCH
1.0000 | MEDICATED_PATCH | CUTANEOUS | Status: DC
  Administered 2023-02-19 – 2023-02-21 (×3): 1 via TRANSDERMAL
  Filled 2023-02-19 (×4): qty 1

## 2023-02-19 MED ORDER — TRAVASOL 10 % IV SOLN
INTRAVENOUS | Status: AC
  Filled 2023-02-19: qty 840

## 2023-02-19 MED ORDER — TRAVASOL 10 % IV SOLN: INTRAVENOUS | Status: DC

## 2023-02-19 MED ORDER — ACETAMINOPHEN 10 MG/ML IV SOLN
1000.0000 mg | Freq: Four times a day (QID) | INTRAVENOUS | Status: AC
  Administered 2023-02-19 – 2023-02-20 (×3): 1000 mg via INTRAVENOUS
  Filled 2023-02-19 (×4): qty 100

## 2023-02-19 NOTE — TOC Progression Note (Addendum)
 Transition of Care [facility]) - Progression Note    Patient Details  Name: Candice Brown MRN: [id number] Date of Birth: [birth year]  [facility] CM/SW Contact  Hoy DELENA Bigness, LCSW Phone Number: 02/19/2023, 2:24 PM  Clinical Narrative:    Spoke with Katie at [facility] who share they will have a bed at their SNF for pt when medically ready.  Insurance auth requested for potential admit date of 1/9. Will follow for ins approval and medical stability.   Left VM with pt's daughter at pt request. Awaiting return call.         Expected Discharge Plan and Services                                               Social Determinants of Health (SDOH) Interventions SDOH Screenings   Food Insecurity: No Food Insecurity (02/16/2023)  Housing: Low Risk  (02/16/2023)  Transportation Needs: No Transportation Needs (02/16/2023)  Utilities: Not At Risk (02/16/2023)  Social Connections: Socially Integrated (02/16/2023)  Stress: No Stress Concern Present (07/21/2021)  Tobacco Use: High Risk (02/15/2023)    Readmission Risk Interventions     No data to display

## 2023-02-19 NOTE — Progress Notes (Signed)
 Progress Note  4 Days Post-Op  Subjective: Abdominal pain overall stable. Bloating stable to better. Passing flatus as of last night. No BM. No nausea. She has been getting OOB  Daughter at bedside  Objective: Vital signs in last 24 hours: Temp:  [98 F (36.7 C)-98.1 F (36.7 C)] 98.1 F (36.7 C) (01/07 0428) Pulse Rate:  [70-81] 70 (01/07 0428) Resp:  [15-18] 18 (01/07 0428) BP: (124-139)/(63-68) 139/68 (01/07 0428) SpO2:  [91 %-93 %] 92 % (01/07 0428) Weight:  [54.8 kg] 54.8 kg (01/07 0230) Last BM Date : 02/15/23  Intake/Output from previous day: 01/06 0701 - 01/07 0700 In: 1024.1 [I.V.:824.1; IV Piggyback:200] Out: 450 [Emesis/NG output:450] Intake/Output this shift: No intake/output data recorded.  PE: General: pleasant, WD, elderly female who is laying in bed in NAD Lungs: Respiratory effort nonlabored Abd: soft, appropriately ttp, honeycomb to midline with small amount dried blood centrally, NGT with thin bilious drainage, mild distention Psych: A&Ox3 with an appropriate affect.    Lab Results:  Recent Labs    02/18/23 0419  WBC 9.0  HGB 12.1  HCT 35.3*  PLT 288   BMET Recent Labs    02/18/23 0419 02/19/23 0410  NA 136 136  K 3.8 3.8  CL 104 104  CO2 25 24  GLUCOSE 126* 142*  BUN 15 19  CREATININE 0.50 0.43*  CALCIUM  8.3* 8.4*   PT/INR No results for input(s): LABPROT, INR in the last 72 hours. CMP     Component Value Date/Time   NA 136 02/19/2023 0410   K 3.8 02/19/2023 0410   CL 104 02/19/2023 0410   CO2 24 02/19/2023 0410   GLUCOSE 142 (H) 02/19/2023 0410   BUN 19 02/19/2023 0410   CREATININE 0.43 (L) 02/19/2023 0410   CREATININE 0.86 11/18/2018 1035   CALCIUM  8.4 (L) 02/19/2023 0410   PROT 5.4 (L) 02/18/2023 0419   PROT 6.7 12/31/2018 1045   ALBUMIN  2.8 (L) 02/18/2023 0419   AST 24 02/18/2023 0419   ALT 19 02/18/2023 0419   ALKPHOS 40 02/18/2023 0419   BILITOT 0.6 02/18/2023 0419   GFRNONAA >60 02/19/2023 0410    GFRNONAA 62 11/18/2018 1035   GFRAA 71 11/18/2018 1035   Lipase     Component Value Date/Time   LIPASE 88 (H) 02/09/2023 1135       Studies/Results: No results found.  Anti-infectives: Anti-infectives (From admission, onward)    Start     Dose/Rate Route Frequency Ordered Stop   02/16/23 0600  ceFAZolin  (ANCEF ) IVPB 2g/100 mL premix        2 g 200 mL/hr over 30 Minutes Intravenous On call to O.R. 02/15/23 1124 02/15/23 1313   02/15/23 1158  piperacillin -tazobactam (ZOSYN ) 3.375 GM/50ML IVPB  Status:  Discontinued       Note to Pharmacy: Gaines, Janel R: cabinet override      02/15/23 1158 02/15/23 1202   02/13/23 1200  piperacillin -tazobactam (ZOSYN ) IVPB 3.375 g  Status:  Discontinued        3.375 g 12.5 mL/hr over 240 Minutes Intravenous Every 8 hours 02/13/23 0918 02/15/23 0916   02/12/23 1230  amoxicillin -clavulanate (AUGMENTIN ) 875-125 MG per tablet 1 tablet  Status:  Discontinued        1 tablet Oral Every 12 hours 02/12/23 1131 02/13/23 0902   02/10/23 1400  piperacillin -tazobactam (ZOSYN ) IVPB 3.375 g  Status:  Discontinued        3.375 g 12.5 mL/hr over 240 Minutes Intravenous Every 8 hours 02/10/23  1205 02/12/23 1131   02/10/23 1000  amoxicillin -clavulanate (AUGMENTIN ) 875-125 MG per tablet 1 tablet  Status:  Discontinued        1 tablet Oral Every 12 hours 02/10/23 0809 02/10/23 1205   02/09/23 1700  piperacillin -tazobactam (ZOSYN ) IVPB 3.375 g        3.375 g 12.5 mL/hr over 240 Minutes Intravenous  Once 02/09/23 1652 02/09/23 2145        Assessment/Plan  SBO POD4 s/p ex-lap with lysis of adhesive band x2 Dr. Signe - ileus improving - +flatus - NGT clamp trial today and can have clears from the floor. NGT back to suction for n/v, worsening abdominal pain or distension. If still in place overnight will need to be back to LIWS overnight. Discussed with RN - continue TPN  - mobilize as able - reordered IV tylenol  and IV robaxin  increased 1/6 to try and get  pain better controlled. Add lidocaine  patch - goal K >4.0 and Mg >2.0 to optimize bowel function   FEN: TPN, NGT clamp and clears VTE: LMWH ID: no current abx   LOS: 9 days     Candice Brown, [facility] Surgery 02/19/2023, 8:40 AM Please see Amion for pager number during day hours 7:00am-4:30pm

## 2023-02-19 NOTE — Progress Notes (Signed)
 PROGRESS NOTE Candice Brown  FMW:[id number] DOB: 1931-08-30 DOA: 02/09/2023 PCP: Shepard Ade, MD  Brief Narrative/Hospital Course: 89 year old PMH significant for hyperlipidemia, GERD, presented to the ED presented to hospital with abdominal pain, nausea vomiting and constipation for the last 24 hours.  She has a past medical history of hysterectomy, appendectomy and cholecystectomy.  CT abdomen and pelvis concern for short segment abdominal small bowel right lower quadrant regional inflammatory  edematous changes in the mesentery a small volume ascites.  Consideration for localized infectious, inflammatory or ischemic enteritis.  Patient was started on IV Zosyn  and was admitted to hospital for further evaluation and treatment.  During hospitalization, patient failed to improve repeated CT abdomen and pelvis 1/1 showed finding consistent with SBO.  General surgery consulted.  NG tube placed, small bowel protocol was started.  Patient did not improve so subsequently underwent surgical intervention 02/14/2022. CCS following, has been started on TPN     Subjective: Patient seen and examined this morning Resting comfortably on the bedside chair, NG tube clamped, she had flatus last night and this morning and just had BM Overnight afebrile BP stable  Labs reviewed this morning stable electrolytes NG output 450 cc  Assessment and Plan: Principal Problem:   SBO (small bowel obstruction) - closed loop - s/p exlap/lysis of adhesions 02/15/2023 Active Problems:   Malignant neoplasm of upper-outer quadrant of right breast in female, estrogen receptor positive (HCC)   Enteritis   HLD (hyperlipidemia)   GERD (gastroesophageal reflux disease)   Gastroenteritis   Protein-calorie malnutrition, severe  SBO Ileus: Initial CT abdomen showed enteritis managed conservatively, repeat CT scan done as patient did not improve showing SBO, seen by cc S/P ex lap with LOA x 2 Dr. Cameron, Postop slowly improving,  continue TPN, NG tube to LIWS and ARO BF, mobilize as able on IV Tylenol  IV Robaxin  for better pain control, keep K above 4 mag above 2. Overall improving, having flatus and bowel movement, NG tube has been clamped trail today- cont TPN, diet Per CCS .   Hyperlipidemia PTA on Crestor  currently on hold    History of breast cancer Will follow-up as outpatient.   GERD: Continue PPI iv while npo   Hyponatremia: Resolved.    Hypophosphatemia Hypokalemia: Resolved.  Monitor electrolytes   DVT prophylaxis: enoxaparin  (LOVENOX ) injection 40 mg Start: 02/12/23 2200 SCDs Start: 02/09/23 1825 Code Status:   Code Status: Full Code Family Communication: plan of care discussed with patient at bedside.  No family at the bedside Patient status is: Remains hospitalized because of severity of illness Level of care: Med-Surg   Dispo: The patient is from: ILF at friend's home            Anticipated disposition: TBD.  Pending surgical clearance  Objective: Vitals last 24 hrs: Vitals:   02/18/23 1152 02/18/23 2129 02/19/23 0230 02/19/23 0428  BP: 135/64 124/63  139/68  Pulse: 81 76  70  Resp: 15 18  18   Temp: 98 F (36.7 C) 98.1 F (36.7 C)  98.1 F (36.7 C)  TempSrc:      SpO2: 91% 93%  92%  Weight:   54.8 kg   Height:      Weight change: -0.811 kg  Physical Examination: General exam: alert awake, oriented, pleasant NAD HEENT:Oral mucosa moist, Ear/Nose WNL grossly Respiratory system: Bilaterally clear BS,no use of accessory muscle Cardiovascular system: S1 & S2 +, No JVD. Gastrointestinal system: Abdomen soft, honeycomb dressing present on the central surgical  site which is clean dry ,BS+, mild tenderness+ Nervous System: Alert, awake, moving all extremities,and following commands. Extremities: LE edema neg,distal peripheral pulses palpable and warm.  Skin: No rashes,no icterus. MSK: Normal muscle bulk,tone, power  Abdomen soft, BM sluggish,NT,ND, honeycomb dressing+ on central  surgical site  NGT+  Medications reviewed:  Scheduled Meds:  Chlorhexidine  Gluconate Cloth  6 each Topical Daily   enoxaparin  (LOVENOX ) injection  40 mg Subcutaneous Q24H   insulin  aspart  0-9 Units Subcutaneous Q6H   lidocaine   1 patch Transdermal Q24H   pantoprazole  (PROTONIX ) IV  40 mg Intravenous Q12H   pregabalin   75 mg Oral Daily   thiamine  (VITAMIN B1) injection  100 mg Intravenous Daily  Continuous Infusions:  acetaminophen      TPN ADULT (ION) 60 mL/hr at 02/18/23 1753    Diet Order             Diet NPO time specified Except for: Ice Chips, Other (See Comments)  Diet effective now                  Intake/Output Summary (Last 24 hours) at 02/19/2023 1041 Last data filed at 02/19/2023 9376 Gross per 24 hour  Intake 1024.05 ml  Output 450 ml  Net 574.05 ml  Net IO Since Admission: 3,573.33 mL [02/19/23 1041]  Wt Readings from Last 3 Encounters:  02/19/23 54.8 kg  12/27/22 53.8 kg  06/05/22 52 kg    Unresulted Labs (From admission, onward)     Start     Ordered   02/18/23 0500  Comprehensive metabolic panel  (Standard TPN Labs (all labs to be drawn at 0500))  Every Mon,Thu (0500),   R     Question:  Specimen collection method  Answer:  Lab=Lab collect   02/16/23 0953   02/18/23 0500  Magnesium  (Standard TPN Labs (all labs to be drawn at 0500))  Every Mon,Thu (0500),   R     Question:  Specimen collection method  Answer:  Lab=Lab collect   02/16/23 0953   02/18/23 0500  Phosphorus  (Standard TPN Labs (all labs to be drawn at 0500))  Every Mon,Thu (0500),   R     Question:  Specimen collection method  Answer:  Lab=Lab collect   02/16/23 0953   02/18/23 0500  Triglycerides  (Standard TPN Labs (all labs to be drawn at 0500))  Every Monday (0500),   R     Question:  Specimen collection method  Answer:  Lab=Lab collect   02/16/23 0953          Data Reviewed: I have personally reviewed following labs and imaging studies CBC: Recent Labs  Lab 02/14/23 0534  02/15/23 0538 02/16/23 0704 02/18/23 0419  WBC 9.2 9.5 9.8 9.0  HGB 13.6 13.4 12.5 12.1  HCT 41.5 39.7 37.9 35.3*  MCV 101.0* 98.5 100.0 99.2  PLT 298 327 312 288   Basic Metabolic Panel:  Recent Labs  Lab 02/14/23 0534 02/15/23 0538 02/16/23 0704 02/17/23 0337 02/18/23 0419 02/19/23 0410  NA 132* 137 135 137 136 136  K 4.1 3.7 3.5 3.3* 3.8 3.8  CL 99 99 102 103 104 104  CO2 22 26 25 26 25 24   GLUCOSE 97 97 77 105* 126* 142*  BUN 21 24* 23 20 15 19   CREATININE 0.69 0.76 0.64 0.58 0.50 0.43*  CALCIUM  8.9 9.3 8.2* 8.1* 8.3* 8.4*  MG 2.2  --  2.1 2.1 2.2  --   PHOS  --   --  2.6 1.8* 2.5  --    GFR: Estimated Creatinine Clearance: 39.4 mL/min (A) (by C-G formula based on SCr of 0.43 mg/dL (L)). Liver Function Tests:  Recent Labs  Lab 02/17/23 0337 02/18/23 0419  AST 23 24  ALT 19 19  ALKPHOS 30* 40  BILITOT 0.6 0.6  PROT 5.2* 5.4*  ALBUMIN  2.7* 2.8*   Recent Labs    02/18/23 0419  TRIG 97  No results for input(s): TSH, T4TOTAL, FREET4, T3FREE, THYROIDAB in the last 72 hours. Sepsis Labs: No results for input(s): PROCALCITON, LATICACIDVEN in the last 168 hours. Recent Results (from the past 240 hours)  Blood culture (routine x 2)     Status: None   Collection Time: 02/09/23  5:30 PM   Specimen: BLOOD  Result Value Ref Range Status   Specimen Description   Final    BLOOD LEFT ANTECUBITAL Performed at [facility], [address], [place], [place] [address]    Special Requests   Final    BOTTLES DRAWN AEROBIC AND ANAEROBIC Blood Culture adequate volume Performed at [facility], [address] [place], [place] [address]    Culture   Final    NO GROWTH 5 DAYS Performed at [facility], [address], [place], [place] [address]    Report Status 02/14/2023 FINAL  Final  Blood culture (routine x 2)     Status: None   Collection Time: 02/09/23  5:45 PM   Specimen: BLOOD  Result Value Ref Range  Status   Specimen Description   Final    BLOOD LEFT ANTECUBITAL Performed at [facility], [address], [place], [place] [address]    Special Requests   Final    BOTTLES DRAWN AEROBIC AND ANAEROBIC Blood Culture results may not be optimal due to an inadequate volume of blood received in culture bottles Performed at [facility], [address] [place], [place] [address]    Culture   Final    NO GROWTH 5 DAYS Performed at [facility], [address], [place], [place] [address]    Report Status 02/14/2023 FINAL  Final  MRSA Next Gen by PCR, Nasal     Status: None   Collection Time: 02/09/23  9:06 PM   Specimen: Nasal Mucosa; Nasal Swab  Result Value Ref Range Status   MRSA by PCR Next Gen NOT DETECTED NOT DETECTED Final    Comment: (NOTE) The GeneXpert MRSA Assay (FDA approved for NASAL specimens only), is one component of a comprehensive MRSA colonization surveillance program. It is not intended to diagnose MRSA infection nor to guide or monitor treatment for MRSA infections. Test performance is not FDA approved in patients less than 26 years old. Performed at [facility], [address] [place], [place] [address]   Antimicrobials/Microbiology: Anti-infectives (From admission, onward)    Start     Dose/Rate Route Frequency Ordered Stop   02/16/23 0600  ceFAZolin  (ANCEF ) IVPB 2g/100 mL premix        2 g 200 mL/hr over 30 Minutes Intravenous On call to O.R. 02/15/23 1124 02/15/23 1313   02/15/23 1158  piperacillin -tazobactam (ZOSYN ) 3.375 GM/50ML IVPB  Status:  Discontinued       Note to Pharmacy: Gaines, Janel R: cabinet override      02/15/23 1158 02/15/23 1202   02/13/23 1200  piperacillin -tazobactam (ZOSYN ) IVPB 3.375 g  Status:  Discontinued        3.375  g 12.5 mL/hr over 240 Minutes Intravenous Every 8 hours 02/13/23 0918 02/15/23 0916   02/12/23 1230  amoxicillin -clavulanate (AUGMENTIN ) 875-125 MG per  tablet 1 tablet  Status:  Discontinued        1 tablet Oral Every 12 hours 02/12/23 1131 02/13/23 0902   02/10/23 1400  piperacillin -tazobactam (ZOSYN ) IVPB 3.375 g  Status:  Discontinued        3.375 g 12.5 mL/hr over 240 Minutes Intravenous Every 8 hours 02/10/23 1205 02/12/23 1131   02/10/23 1000  amoxicillin -clavulanate (AUGMENTIN ) 875-125 MG per tablet 1 tablet  Status:  Discontinued        1 tablet Oral Every 12 hours 02/10/23 0809 02/10/23 1205   02/09/23 1700  piperacillin -tazobactam (ZOSYN ) IVPB 3.375 g        3.375 g 12.5 mL/hr over 240 Minutes Intravenous  Once 02/09/23 1652 02/09/23 2145         Component Value Date/Time   SDES  02/09/2023 1745    BLOOD LEFT ANTECUBITAL Performed at [facility], [address], [place], [place] [address]    SPECREQUEST  02/09/2023 1745    BOTTLES DRAWN AEROBIC AND ANAEROBIC Blood Culture results may not be optimal due to an inadequate volume of blood received in culture bottles Performed at [facility], [address] [place], [place] [address]    CULT  02/09/2023 1745    NO GROWTH 5 DAYS Performed at [facility], [address], [place], [place] [address]    REPTSTATUS 02/14/2023 FINAL 02/09/2023 1745     Radiology Studies: No results found.  LOS: 9 days   Total time spent in review of labs and imaging, patient evaluation, formulation of plan, documentation and communication with family: 35 minutes  Mennie LAMY, MD [facility]  02/19/2023, 10:41 AM

## 2023-02-19 NOTE — [place] FL2 (Signed)
 [place]  MEDICAID FL2 LEVEL OF CARE FORM     IDENTIFICATION  Patient Name: Candice Brown Birthdate: 03/21/1931 Sex: female Admission Date (Current Location): 02/09/2023  [facility] and [place] Number:  Producer, Television/film/video and Address:  [facility],  [address], [place] [address]      Provider Number: [id number]  Attending Physician Name and Address:  Christobal Guadalajara, MD  Relative Name and Phone Number:       Current Level of Care: Hospital Recommended Level of Care: Skilled Nursing Facility Prior Approval Number:    Date Approved/Denied:   PASRR Number: [id number]  Discharge Plan: SNF    Current Diagnoses: Patient Active Problem List   Diagnosis Date Noted   Protein-calorie malnutrition, severe 02/19/2023   SBO (small bowel obstruction) - closed loop - s/p exlap/lysis of adhesions 02/15/2023 02/16/2023   Enteritis 02/10/2023   HLD (hyperlipidemia) 02/10/2023   GERD (gastroesophageal reflux disease) 02/10/2023   Gastroenteritis 02/10/2023   Contusion, back 12/05/2022   Malignant neoplasm of upper-outer quadrant of right breast in female, estrogen receptor positive (HCC) 06/29/2021   Low back pain 05/24/2021   Chronic diarrhea 05/18/2020   Change in bowel habits 05/18/2020   Knee pain, bilateral 11/18/2018    Orientation RESPIRATION BLADDER Height & Weight     Self, Time, Situation, Place  Normal Continent Weight: 120 lb 13 oz (54.8 kg) Height:  5' 3 (160 cm)  BEHAVIORAL SYMPTOMS/MOOD NEUROLOGICAL BOWEL NUTRITION STATUS      Continent Diet (See discharge summary)  AMBULATORY STATUS COMMUNICATION OF NEEDS Skin   Limited Assist Verbally Surgical wounds                       Personal Care Assistance Level of Assistance  Bathing, Feeding, Dressing Bathing Assistance: Limited assistance Feeding assistance: Independent Dressing Assistance: Limited assistance     Functional Limitations Info  Sight, Hearing, Speech Sight Info: Impaired Hearing  Info: Impaired Speech Info: Adequate    SPECIAL CARE FACTORS FREQUENCY  PT (By licensed PT), OT (By licensed OT)     PT Frequency: 5x/wk OT Frequency: 5x/wk            Contractures Contractures Info: Not present    Additional Factors Info  Code Status, Allergies Code Status Info: FULL Allergies Info: Linzess (Linaclotide), Oxycodone, Simvastatin, Benzalkonium Chloride, Neosporin (Neomycin-polymyxin-gramicidin), Tape           Current Medications (02/19/2023):  This is the current hospital active medication list Current Facility-Administered Medications  Medication Dose Route Frequency Provider Last Rate Last Admin   acetaminophen  (OFIRMEV ) IV 1,000 mg  1,000 mg Intravenous Q6H Rosalba Glendale DEL, PA-C 400 mL/hr at 02/19/23 1219 1,000 mg at 02/19/23 1219   Chlorhexidine  Gluconate Cloth 2 % PADS 6 each  6 each Topical Daily Pokhrel, Laxman, MD   6 each at 02/19/23 1035   enoxaparin  (LOVENOX ) injection 40 mg  40 mg Subcutaneous Q24H Signe Cree A, MD   40 mg at 02/18/23 2148   hydrALAZINE  (APRESOLINE ) injection 5 mg  5 mg Intravenous Q6H PRN Signe Cree A, MD   5 mg at 02/14/23 1343   HYDROmorphone  (DILAUDID ) injection 0.5 mg  0.5 mg Intravenous Q4H PRN Signe Cree A, MD   0.5 mg at 02/18/23 0931   insulin  aspart (novoLOG ) injection 0-9 Units  0-9 Units Subcutaneous Q6H Batchelder, Nathan J, RPH   2 Units at 02/19/23 1220   lidocaine  (LIDODERM ) 5 % 1 patch  1 patch Transdermal  Q24H Kabrich, Martha H, PA-C   1 patch at 02/19/23 1219   methocarbamol  (ROBAXIN ) injection 1,000 mg  1,000 mg Intravenous Q6H PRN Vicci Burnard SAUNDERS, PA-C       ondansetron  (ZOFRAN ) injection 4 mg  4 mg Intravenous Q6H PRN Signe Cree A, MD       pantoprazole  (PROTONIX ) injection 40 mg  40 mg Intravenous Q12H Signe Cree A, MD   40 mg at 02/19/23 1031   phenol (CHLORASEPTIC) mouth spray 1 spray  1 spray Mouth/Throat PRN Signe Cree LABOR, MD   1 spray at 02/14/23 0239   pregabalin  (LYRICA )  capsule 75 mg  75 mg Oral Daily Signe Cree A, MD   75 mg at 02/12/23 2102   prochlorperazine  (COMPAZINE ) injection 5 mg  5 mg Intravenous Q6H PRN Signe Cree LABOR, MD       sodium chloride  flush (NS) 0.9 % injection 10-40 mL  10-40 mL Intracatheter PRN Pokhrel, Laxman, MD   10 mL at 02/18/23 0931   thiamine  (VITAMIN B1) injection 100 mg  100 mg Intravenous Daily Ellington, Abby K, RPH   100 mg at 02/19/23 1032   TPN ADULT (ION)   Intravenous Continuous TPN Sherrod Rankin PARAS, RPH 60 mL/hr at 02/18/23 1753 New Bag at 02/18/23 1753   TPN ADULT (ION)   Intravenous Continuous TPN Shade, Christine E, RPH         Discharge Medications: Please see discharge summary for a list of discharge medications.  Relevant Imaging Results:  Relevant Lab Results:   Additional Information SSN: [id number]  [place] LABOR Bigness, LCSW

## 2023-02-19 NOTE — Progress Notes (Signed)
 PHARMACY - TOTAL PARENTERAL NUTRITION CONSULT NOTE   Indication: Prolonged ileus  Patient Measurements: Height: 5' 3 (160 cm) Weight: 54.8 kg (120 lb 13 oz) IBW/kg (Calculated) : 52.4 TPN AdjBW (KG): 53.5 Body mass index is 21.4 kg/m.  Assessment: 89 year old female with small bowel obstruction s/p ex lap and lysis of adhesions. Patient with prolonged lack of nutrition. Has NGT in place, currently to suction. Plan for TPN.  Glucose / Insulin : CBGs stable near 150.  6 units of SSI in last 24 hours. Electrolytes: lytes remain wnl today.  (replacement over the weekend) Renal: SCr low/stable.  BUN WNL Hepatic: LFTs and tbili wnl; alb 2.8. Trig wnl (1/6) Intake / Output; MIVF:  -NG output increased to yesterday.  Clamp trial with clears from floor on 1/7.  - Urine output x3 unmeasured occurrences -LBM documented 1/7 GI Imaging: 1/3 Abd xray - ongoing small bowel obstruction with unchanged diffuse gaseous distension of small bowel 1/4 Abd xray shows improving small bowel obstruction GI Surgeries / Procedures:  1/3 ex lap with lysis of adhesions  Central access: PICC placed 1/4 TPN start date: 1/4   Nutritional Goals: Goal TPN rate is 70 mL/hr (provides 84g of protein and 1647 kcals per day)  RD Assessment: (updated 1/6) Kcal 1500-1700 kcal/day Protein 75-85 g/day Fluid 1.7 L/day  Current Nutrition: NPO  Plan:  Increase to TPN at goal 70 mL/hr Electrolytes in TPN: Na 50 mEq/L, K 50 mEq/L, Ca 5 mEq/L, Mg 5 mEq/L, and Phos 15 mmol/L. Cl:Ac 1:1 Add standard MVI and trace elements to TPN Thiamine  x 5 days for refeeding risk (1/4-1/8) Continue Sensitive q6h SSI and adjust as needed Monitor TPN labs on Mon/Thurs, BMET with AM labs.    Wanda Hasting PharmD, BCPS [facility] main pharmacy [phone] 02/19/2023 10:55 AM

## 2023-02-19 NOTE — Plan of Care (Signed)
  Problem: Education: Goal: Knowledge of General Education information will improve Description: Including pain rating scale, medication(s)/side effects and non-pharmacologic comfort measures Outcome: Progressing   Problem: Health Behavior/Discharge Planning: Goal: Ability to manage health-related needs will improve Outcome: Progressing   Problem: Clinical Measurements: Goal: Diagnostic test results will improve Outcome: Progressing   Problem: Activity: Goal: Risk for activity intolerance will decrease Outcome: Progressing   Problem: Nutrition: Goal: Adequate nutrition will be maintained Outcome: Progressing   Problem: Coping: Goal: Level of anxiety will decrease Outcome: Progressing   Problem: Elimination: Goal: Will not experience complications related to bowel motility Outcome: Progressing   Problem: Pain Management: Goal: General experience of comfort will improve Outcome: Progressing   Problem: Safety: Goal: Ability to remain free from injury will improve Outcome: Progressing   Problem: Skin Integrity: Goal: Risk for impaired skin integrity will decrease Outcome: Progressing   Problem: Education: Goal: Ability to describe self-care measures that may prevent or decrease complications (Diabetes Survival Skills Education) will improve Outcome: Progressing   Problem: Health Behavior/Discharge Planning: Goal: Ability to manage health-related needs will improve Outcome: Progressing   Problem: Skin Integrity: Goal: Risk for impaired skin integrity will decrease Outcome: Progressing   Problem: Tissue Perfusion: Goal: Adequacy of tissue perfusion will improve Outcome: Progressing

## 2023-02-19 NOTE — Plan of Care (Signed)
  Problem: Education: Goal: Knowledge of General Education information will improve Description: Including pain rating scale, medication(s)/side effects and non-pharmacologic comfort measures Outcome: Progressing   Problem: Health Behavior/Discharge Planning: Goal: Ability to manage health-related needs will improve Outcome: Progressing   Problem: Clinical Measurements: Goal: Ability to maintain clinical measurements within normal limits will improve Outcome: Progressing Goal: Will remain free from infection Outcome: Progressing Goal: Diagnostic test results will improve Outcome: Progressing Goal: Respiratory complications will improve Outcome: Progressing   Problem: Activity: Goal: Risk for activity intolerance will decrease Outcome: Progressing   Problem: Nutrition: Goal: Adequate nutrition will be maintained Outcome: Progressing   Problem: Coping: Goal: Level of anxiety will decrease Outcome: Progressing   Problem: Elimination: Goal: Will not experience complications related to bowel motility Outcome: Progressing Goal: Will not experience complications related to urinary retention Outcome: Progressing   Problem: Pain Management: Goal: General experience of comfort will improve Outcome: Progressing   Problem: Safety: Goal: Ability to remain free from injury will improve Outcome: Progressing   Problem: Skin Integrity: Goal: Risk for impaired skin integrity will decrease Outcome: Progressing   Problem: Education: Goal: Ability to describe self-care measures that may prevent or decrease complications (Diabetes Survival Skills Education) will improve Outcome: Progressing Goal: Individualized Educational Video(s) Outcome: Progressing   Problem: Coping: Goal: Ability to adjust to condition or change in health will improve Outcome: Progressing   Problem: Fluid Volume: Goal: Ability to maintain a balanced intake and output will improve Outcome: Progressing    Problem: Health Behavior/Discharge Planning: Goal: Ability to identify and utilize available resources and services will improve Outcome: Progressing Goal: Ability to manage health-related needs will improve Outcome: Progressing   Problem: Metabolic: Goal: Ability to maintain appropriate glucose levels will improve Outcome: Progressing   Problem: Nutritional: Goal: Maintenance of adequate nutrition will improve Outcome: Progressing Goal: Progress toward achieving an optimal weight will improve Outcome: Progressing   Problem: Skin Integrity: Goal: Risk for impaired skin integrity will decrease Outcome: Progressing   Problem: Tissue Perfusion: Goal: Adequacy of tissue perfusion will improve Outcome: Progressing

## 2023-02-20 ENCOUNTER — Inpatient Hospital Stay (HOSPITAL_COMMUNITY): Payer: Medicare Other

## 2023-02-20 DIAGNOSIS — K56609 Unspecified intestinal obstruction, unspecified as to partial versus complete obstruction: Secondary | ICD-10-CM | POA: Diagnosis not present

## 2023-02-20 LAB — GLUCOSE, CAPILLARY
Glucose-Capillary: 117 mg/dL — ABNORMAL HIGH (ref 70–99)
Glucose-Capillary: 118 mg/dL — ABNORMAL HIGH (ref 70–99)
Glucose-Capillary: 132 mg/dL — ABNORMAL HIGH (ref 70–99)
Glucose-Capillary: 143 mg/dL — ABNORMAL HIGH (ref 70–99)

## 2023-02-20 LAB — BASIC METABOLIC PANEL
Anion gap: 7 (ref 5–15)
BUN: 20 mg/dL (ref 8–23)
CO2: 23 mmol/L (ref 22–32)
Calcium: 8.5 mg/dL — ABNORMAL LOW (ref 8.9–10.3)
Chloride: 102 mmol/L (ref 98–111)
Creatinine, Ser: 0.32 mg/dL — ABNORMAL LOW (ref 0.44–1.00)
GFR, Estimated: >60 mL/min (ref >60)
Glucose, Bld: 152 mg/dL — ABNORMAL HIGH (ref 70–99)
Potassium: 4.1 mmol/L (ref 3.5–5.1)
Sodium: 132 mmol/L — ABNORMAL LOW (ref 135–145)

## 2023-02-20 MED ORDER — LORAZEPAM 2 MG/ML IJ SOLN
0.5000 mg | Freq: Once | INTRAMUSCULAR | Status: AC | PRN
  Administered 2023-02-20: 0.5 mg via INTRAVENOUS
  Filled 2023-02-20: qty 1

## 2023-02-20 MED ORDER — POTASSIUM ACETATE 2 MEQ/ML IV SOLN
INTRAVENOUS | Status: AC
  Filled 2023-02-20: qty 840

## 2023-02-20 MED ORDER — DEXTROSE-SODIUM CHLORIDE 5-0.9 % IV SOLN: INTRAVENOUS | Status: AC

## 2023-02-20 MED ORDER — INSULIN ASPART 100 UNIT/ML IJ SOLN
0.0000 [IU] | Freq: Three times a day (TID) | INTRAMUSCULAR | Status: DC
  Administered 2023-02-20 – 2023-02-21 (×3): 1 [IU] via SUBCUTANEOUS

## 2023-02-20 NOTE — Progress Notes (Signed)
 PT Cancellation Note  Patient Details Name: MARGARETTA CHITTUM MRN: [id number] DOB: 02/01/1932   Cancelled Treatment:    Reason Eval/Treat Not Completed: Medical issues which prohibited therapy Per RN, not  havng a good day.  Darice Potters PT Acute Rehabilitation Services Office [phone] Weekend pager-[phone]   Potters Darice Norris 02/20/2023, 2:17 PM

## 2023-02-20 NOTE — Progress Notes (Signed)
 PHARMACY - TOTAL PARENTERAL NUTRITION CONSULT NOTE   Indication: Prolonged ileus  Patient Measurements: Height: 5' 3 (160 cm) Weight: 56 kg (123 lb 7.4 oz) IBW/kg (Calculated) : 52.4 TPN AdjBW (KG): 53.5 Body mass index is 21.87 kg/m.  Assessment: 89 year old female with small bowel obstruction s/p ex lap and lysis of adhesions. Patient with prolonged lack of nutrition. Has NGT in place, currently to suction. Plan for TPN.  Glucose / Insulin : No Hx DM - CBGs slightly improved yesterday; consistently at goal (100-150) - 3 units SSI used yesterday Electrolytes: lytes remain stable except Na slightly low (last Mg/Phos WNL on 1/6) Renal: SCr low/stable; BUN WNL; not charting UOP Hepatic: (1/6) LFTs and tbili wnl; alb 2.8 - TG WNL 1/6 I/O:  - NG removed yesterday afternoon with significantly improved output - multiple loose BMs, but also vomited x 1 since yesterday - no MIVF - continues on CLD - not ready to wean TPN yet per Surgery (1/8) GI Imaging: 1/3 AXR: ongoing SBO with unchanged diffuse gaseous distension of SB 1/4 AXR: improving SBO GI Surgeries / Procedures:  1/3 ex lap with lysis of adhesions  Central access: PICC placed 1/4 TPN start date: 1/4   Nutritional Goals: Goal TPN rate is 70 mL/hr (provides 84g of protein and 1647 kcals per day)  RD Assessment: (updated 1/6) Kcal 1500-1700 kcal/day Protein 75-85 g/day Fluid 1.7 L/day  Current Nutrition: CLD, TPN at 70 ml/hr  Plan:  At 1800: Continue TPN at 70 ml/hr Electrolytes in TPN: increase Na Na - 50 >> 100 mEq/L K - 50 mEq/L Ca - 5 mEq/L Mg - 5 mEq/L Phos - 15 mmol/L Cl:Ac ratio - 1:1 Add standard MVI and trace elements to TPN Continue sensitive SSI; will adjust to q8 hr CBG checks given good control on goal-rate TPN MIVF per MD Monitor TPN labs on Mon/Thurs  Bard Jeans, PharmD, BCPS [phone] 02/20/2023, 9:57 AM

## 2023-02-20 NOTE — Progress Notes (Signed)
 5 Days Post-Op   Subjective/Chief Complaint: Patient has had multiple loose BM's but also an episode of emesis   Objective: Vital signs in last 24 hours: Temp:  [98.1 F (36.7 C)-98.5 F (36.9 C)] 98.5 F (36.9 C) (01/08 0535) Pulse Rate:  [65-69] 67 (01/08 0535) Resp:  [13-18] 18 (01/08 0535) BP: (119-144)/(60-67) 144/67 (01/08 0535) SpO2:  [94 %-97 %] 94 % (01/08 0535) Weight:  [56 kg] 56 kg (01/08 0500) Last BM Date : 02/19/23  Intake/Output from previous day: 01/07 0701 - 01/08 0700 In: 1556.2 [P.O.:30; I.V.:1426.2; IV Piggyback:100] Out: 50  Intake/Output this shift: No intake/output data recorded.  Exam: Awake and alert Abdomen slightly more distended this morning, minimally tender  Lab Results:  Recent Labs    02/18/23 0419  WBC 9.0  HGB 12.1  HCT 35.3*  PLT 288   BMET Recent Labs    02/19/23 0410 02/20/23 0315  NA 136 132*  K 3.8 4.1  CL 104 102  CO2 24 23  GLUCOSE 142* 152*  BUN 19 20  CREATININE 0.43* 0.32*  CALCIUM  8.4* 8.5*   PT/INR No results for input(s): LABPROT, INR in the last 72 hours. ABG No results for input(s): PHART, HCO3 in the last 72 hours.  Invalid input(s): PCO2, PO2  Studies/Results: No results found.  Anti-infectives: Anti-infectives (From admission, onward)    Start     Dose/Rate Route Frequency Ordered Stop   02/16/23 0600  ceFAZolin  (ANCEF ) IVPB 2g/100 mL premix        2 g 200 mL/hr over 30 Minutes Intravenous On call to O.R. 02/15/23 1124 02/15/23 1313   02/15/23 1158  piperacillin -tazobactam (ZOSYN ) 3.375 GM/50ML IVPB  Status:  Discontinued       Note to Pharmacy: Gaines, Janel R: cabinet override      02/15/23 1158 02/15/23 1202   02/13/23 1200  piperacillin -tazobactam (ZOSYN ) IVPB 3.375 g  Status:  Discontinued        3.375 g 12.5 mL/hr over 240 Minutes Intravenous Every 8 hours 02/13/23 0918 02/15/23 0916   02/12/23 1230  amoxicillin -clavulanate (AUGMENTIN ) 875-125 MG per tablet 1 tablet   Status:  Discontinued        1 tablet Oral Every 12 hours 02/12/23 1131 02/13/23 0902   02/10/23 1400  piperacillin -tazobactam (ZOSYN ) IVPB 3.375 g  Status:  Discontinued        3.375 g 12.5 mL/hr over 240 Minutes Intravenous Every 8 hours 02/10/23 1205 02/12/23 1131   02/10/23 1000  amoxicillin -clavulanate (AUGMENTIN ) 875-125 MG per tablet 1 tablet  Status:  Discontinued        1 tablet Oral Every 12 hours 02/10/23 0809 02/10/23 1205   02/09/23 1700  piperacillin -tazobactam (ZOSYN ) IVPB 3.375 g        3.375 g 12.5 mL/hr over 240 Minutes Intravenous  Once 02/09/23 1652 02/09/23 2145       Assessment/Plan: SBO POD 5 s/p ex-lap with lysis of adhesive band x2 Dr. Signe  -having bowel function but still has a slight ileus.  Will keep on liquids for now but if she has recurrent emesis, will need to be NPO and my need an NG again -continue TPN   LOS: 10 days    Vicenta Poli 02/20/2023

## 2023-02-20 NOTE — Progress Notes (Signed)
 PROGRESS NOTE BEN SANZ  FMW:[id number] DOB: Jun 03, 1931 DOA: 02/09/2023 PCP: Shepard Ade, MD  Brief Narrative/Hospital Course: 89 year old PMH significant for hyperlipidemia, GERD, presented to the ED presented to hospital with abdominal pain, nausea vomiting and constipation for the last 24 hours.  She has a past medical history of hysterectomy, appendectomy and cholecystectomy.  CT abdomen and pelvis concern for short segment abdominal small bowel right lower quadrant regional inflammatory  edematous changes in the mesentery a small volume ascites.  Consideration for localized infectious, inflammatory or ischemic enteritis.  Patient was started on IV Zosyn  and was admitted to hospital for further evaluation and treatment.  During hospitalization, patient failed to improve repeated CT abdomen and pelvis 1/1 showed finding consistent with SBO.  General surgery consulted.  NG tube placed, small bowel protocol was started.  Patient did not improve so subsequently underwent surgical intervention 02/14/2022. CCS following, has been started on TPN     Subjective: Patient seen and examined She does not feel well given multiple bowel movements today Had vomiting yesterday Overnight afebrile BP stable not hypoxic Labs stable  Assessment and Plan: Principal Problem:   SBO (small bowel obstruction) - closed loop - s/p exlap/lysis of adhesions 02/15/2023 Active Problems:   Malignant neoplasm of upper-outer quadrant of right breast in female, estrogen receptor positive (HCC)   Enteritis   HLD (hyperlipidemia)   GERD (gastroesophageal reflux disease)   Gastroenteritis   Protein-calorie malnutrition, severe  SBO Ileus Nausea vomiting and diarrhea: Initial CT abdomen showed enteritis managed conservatively, repeat CT scan done as patient did not improve showing SBO, seen by cc S/P ex lap with LOA x 2 Dr. Cameron. Patient having ileus and also having bowel function also with emesis, keep on liquid and  if persistent emesis will likely need NGT back again. Updated daughter   Hyperlipidemia PTA on Crestor  currently on hold    History of breast cancer Will follow-up as outpatient.   GERD: Continue PPI iv while npo   Hyponatremia: Resolved.    Hypophosphatemia Hypokalemia: Resolved.  Monitor.     DVT prophylaxis: enoxaparin  (LOVENOX ) injection 40 mg Start: 02/12/23 2200 SCDs Start: 02/09/23 1825 Code Status:   Code Status: Full Code Family Communication: plan of care discussed with patient at bedside.  No family at the bedside. Called Daughter Jeb and updated. Patient status is: Remains hospitalized because of severity of illness Level of care: Med-Surg   Dispo: The patient is from: ILF at friend's home            Anticipated disposition: TBD.  Pending surgical clearance  Objective: Vitals last 24 hrs: Vitals:   02/19/23 1339 02/19/23 2115 02/20/23 0500 02/20/23 0535  BP: 119/60 129/61  (!) 144/67  Pulse: 69 65  67  Resp: 13 18  18   Temp: 98.1 F (36.7 C) 98.5 F (36.9 C)  98.5 F (36.9 C)  TempSrc:  Oral  Oral  SpO2: 97% 97%  94%  Weight:   56 kg   Height:      Weight change: 1.201 kg  Physical Examination: General exam: alert awake, appears ill.   HEENT:Oral mucosa moist, Ear/Nose WNL grossly Respiratory system: Bilaterally clear BS,no use of accessory muscle Cardiovascular system: S1 & S2 +, No JVD. Gastrointestinal system: Abdomen soft, surgical site with honeycomb dressing in place, BS+ Nervous System: Alert, awake, moving all extremities,and following commands. Extremities: LE edema neg,distal peripheral pulses palpable and warm.  Skin: No rashes,no icterus. MSK: Normal muscle bulk,tone, power  Medications reviewed:  Scheduled Meds:  Chlorhexidine  Gluconate Cloth  6 each Topical Daily   enoxaparin  (LOVENOX ) injection  40 mg Subcutaneous Q24H   insulin  aspart  0-9 Units Subcutaneous Q8H   lidocaine   1 patch Transdermal Q24H   pantoprazole   (PROTONIX ) IV  40 mg Intravenous Q12H   pregabalin   75 mg Oral Daily  Continuous Infusions:  acetaminophen  1,000 mg (02/20/23 0108)   TPN ADULT (ION) 70 mL/hr at 02/19/23 1741   TPN ADULT (ION)      Diet Order             Diet clear liquid Room service appropriate? Yes; Fluid consistency: Thin  Diet effective now                  Intake/Output Summary (Last 24 hours) at 02/20/2023 1115 Last data filed at 02/19/2023 1741 Gross per 24 hour  Intake 1556.22 ml  Output 50 ml  Net 1506.22 ml  Net IO Since Admission: 5,079.55 mL [02/20/23 1115]  Wt Readings from Last 3 Encounters:  02/20/23 56 kg  12/27/22 53.8 kg  06/05/22 52 kg    Unresulted Labs (From admission, onward)     Start     Ordered   02/18/23 0500  Comprehensive metabolic panel  (Standard TPN Labs (all labs to be drawn at 0500))  Every Mon,Thu (0500),   R     Question:  Specimen collection method  Answer:  Lab=Lab collect   02/16/23 0953   02/18/23 0500  Magnesium  (Standard TPN Labs (all labs to be drawn at 0500))  Every Mon,Thu (0500),   R     Question:  Specimen collection method  Answer:  Lab=Lab collect   02/16/23 0953   02/18/23 0500  Phosphorus  (Standard TPN Labs (all labs to be drawn at 0500))  Every Mon,Thu (0500),   R     Question:  Specimen collection method  Answer:  Lab=Lab collect   02/16/23 0953   02/18/23 0500  Triglycerides  (Standard TPN Labs (all labs to be drawn at 0500))  Every Monday (0500),   R     Question:  Specimen collection method  Answer:  Lab=Lab collect   02/16/23 0953          Data Reviewed: I have personally reviewed following labs and imaging studies CBC: Recent Labs  Lab 02/14/23 0534 02/15/23 0538 02/16/23 0704 02/18/23 0419  WBC 9.2 9.5 9.8 9.0  HGB 13.6 13.4 12.5 12.1  HCT 41.5 39.7 37.9 35.3*  MCV 101.0* 98.5 100.0 99.2  PLT 298 327 312 288   Basic Metabolic Panel:  Recent Labs  Lab 02/14/23 0534 02/15/23 0538 02/16/23 0704 02/17/23 0337 02/18/23 0419  02/19/23 0410 02/20/23 0315  NA 132*   < > 135 137 136 136 132*  K 4.1   < > 3.5 3.3* 3.8 3.8 4.1  CL 99   < > 102 103 104 104 102  CO2 22   < > 25 26 25 24 23   GLUCOSE 97   < > 77 105* 126* 142* 152*  BUN 21   < > 23 20 15 19 20   CREATININE 0.69   < > 0.64 0.58 0.50 0.43* 0.32*  CALCIUM  8.9   < > 8.2* 8.1* 8.3* 8.4* 8.5*  MG 2.2  --  2.1 2.1 2.2  --   --   PHOS  --   --  2.6 1.8* 2.5  --   --    < > =  values in this interval not displayed.   GFR: Estimated Creatinine Clearance: 39.4 mL/min (A) (by C-G formula based on SCr of 0.32 mg/dL (L)). Liver Function Tests:  Recent Labs  Lab 02/17/23 0337 02/18/23 0419  AST 23 24  ALT 19 19  ALKPHOS 30* 40  BILITOT 0.6 0.6  PROT 5.2* 5.4*  ALBUMIN  2.7* 2.8*   Recent Labs    02/18/23 0419  TRIG 97  No results for input(s): TSH, T4TOTAL, FREET4, T3FREE, THYROIDAB in the last 72 hours. Sepsis Labs: No results for input(s): PROCALCITON, LATICACIDVEN in the last 168 hours. No results found for this or any previous visit (from the past 240 hours). Antimicrobials/Microbiology: Anti-infectives (From admission, onward)    Start     Dose/Rate Route Frequency Ordered Stop   02/16/23 0600  ceFAZolin  (ANCEF ) IVPB 2g/100 mL premix        2 g 200 mL/hr over 30 Minutes Intravenous On call to O.R. 02/15/23 1124 02/15/23 1313   02/15/23 1158  piperacillin -tazobactam (ZOSYN ) 3.375 GM/50ML IVPB  Status:  Discontinued       Note to Pharmacy: Gaines, Janel R: cabinet override      02/15/23 1158 02/15/23 1202   02/13/23 1200  piperacillin -tazobactam (ZOSYN ) IVPB 3.375 g  Status:  Discontinued        3.375 g 12.5 mL/hr over 240 Minutes Intravenous Every 8 hours 02/13/23 0918 02/15/23 0916   02/12/23 1230  amoxicillin -clavulanate (AUGMENTIN ) 875-125 MG per tablet 1 tablet  Status:  Discontinued        1 tablet Oral Every 12 hours 02/12/23 1131 02/13/23 0902   02/10/23 1400  piperacillin -tazobactam (ZOSYN ) IVPB 3.375 g  Status:   Discontinued        3.375 g 12.5 mL/hr over 240 Minutes Intravenous Every 8 hours 02/10/23 1205 02/12/23 1131   02/10/23 1000  amoxicillin -clavulanate (AUGMENTIN ) 875-125 MG per tablet 1 tablet  Status:  Discontinued        1 tablet Oral Every 12 hours 02/10/23 0809 02/10/23 1205   02/09/23 1700  piperacillin -tazobactam (ZOSYN ) IVPB 3.375 g        3.375 g 12.5 mL/hr over 240 Minutes Intravenous  Once 02/09/23 1652 02/09/23 2145         Component Value Date/Time   SDES  02/09/2023 1745    BLOOD LEFT ANTECUBITAL Performed at [facility], [address], [place], [place] [address]    SPECREQUEST  02/09/2023 1745    BOTTLES DRAWN AEROBIC AND ANAEROBIC Blood Culture results may not be optimal due to an inadequate volume of blood received in culture bottles Performed at [facility], [address] [place], [place] [address]    CULT  02/09/2023 1745    NO GROWTH 5 DAYS Performed at [facility], [address] [place], [place] [address]    REPTSTATUS 02/14/2023 FINAL 02/09/2023 1745     Radiology Studies: No results found.  LOS: 10 days   Total time spent in review of labs and imaging, patient evaluation, formulation of plan, documentation and communication with family: 35 minutes  Mennie LAMY, MD [facility]  02/20/2023, 11:15 AM

## 2023-02-21 ENCOUNTER — Inpatient Hospital Stay (HOSPITAL_COMMUNITY): Payer: Medicare Other

## 2023-02-21 DIAGNOSIS — K56609 Unspecified intestinal obstruction, unspecified as to partial versus complete obstruction: Secondary | ICD-10-CM | POA: Diagnosis not present

## 2023-02-21 LAB — CBC
HCT: 35.4 % — ABNORMAL LOW (ref 36.0–46.0)
Hemoglobin: 11.5 g/dL — ABNORMAL LOW (ref 12.0–15.0)
MCH: 33.1 pg (ref 26.0–34.0)
MCHC: 32.5 g/dL (ref 30.0–36.0)
MCV: 102 fL — ABNORMAL HIGH (ref 80.0–100.0)
Platelets: 329 10*3/uL (ref 150–400)
RBC: 3.47 MIL/uL — ABNORMAL LOW (ref 3.87–5.11)
RDW: 12.4 % (ref 11.5–15.5)
WBC: 10.2 10*3/uL (ref 4.0–10.5)
nRBC: 0 % (ref 0.0–0.2)

## 2023-02-21 LAB — BASIC METABOLIC PANEL
Anion gap: 7 (ref 5–15)
BUN: 23 mg/dL (ref 8–23)
CO2: 24 mmol/L (ref 22–32)
Calcium: 8.5 mg/dL — ABNORMAL LOW (ref 8.9–10.3)
Chloride: 105 mmol/L (ref 98–111)
Creatinine, Ser: 0.45 mg/dL (ref 0.44–1.00)
GFR, Estimated: >60 mL/min (ref >60)
Glucose, Bld: 132 mg/dL — ABNORMAL HIGH (ref 70–99)
Potassium: 3.9 mmol/L (ref 3.5–5.1)
Sodium: 136 mmol/L (ref 135–145)

## 2023-02-21 LAB — COMPREHENSIVE METABOLIC PANEL
ALT: 94 U/L — ABNORMAL HIGH (ref 0–44)
AST: 86 U/L — ABNORMAL HIGH (ref 15–41)
Albumin: 2.9 g/dL — ABNORMAL LOW (ref 3.5–5.0)
Alkaline Phosphatase: 123 U/L (ref 38–126)
Anion gap: 9 (ref 5–15)
BUN: 23 mg/dL (ref 8–23)
CO2: 23 mmol/L (ref 22–32)
Calcium: 8.6 mg/dL — ABNORMAL LOW (ref 8.9–10.3)
Chloride: 103 mmol/L (ref 98–111)
Creatinine, Ser: 0.52 mg/dL (ref 0.44–1.00)
GFR, Estimated: >60 mL/min (ref >60)
Glucose, Bld: 125 mg/dL — ABNORMAL HIGH (ref 70–99)
Potassium: 4 mmol/L (ref 3.5–5.1)
Sodium: 135 mmol/L (ref 135–145)
Total Bilirubin: 0.3 mg/dL (ref 0.0–1.2)
Total Protein: 5.9 g/dL — ABNORMAL LOW (ref 6.5–8.1)

## 2023-02-21 LAB — GLUCOSE, CAPILLARY
Glucose-Capillary: 127 mg/dL — ABNORMAL HIGH (ref 70–99)
Glucose-Capillary: 103 mg/dL — ABNORMAL HIGH (ref 70–99)
Glucose-Capillary: 113 mg/dL — ABNORMAL HIGH (ref 70–99)

## 2023-02-21 LAB — MAGNESIUM: Magnesium: 2.1 mg/dL (ref 1.7–2.4)

## 2023-02-21 LAB — PHOSPHORUS: Phosphorus: 2.6 mg/dL (ref 2.5–4.6)

## 2023-02-21 MED ORDER — TRAVASOL 10 % IV SOLN
INTRAVENOUS | Status: AC
  Filled 2023-02-21: qty 840

## 2023-02-21 NOTE — Progress Notes (Signed)
 6 Days Post-Op   Subjective/Chief Complaint: Several episodes of diarrhea up through this morning. Had nausea and vomiting yesterday for which NGT was placed. Still mildly nauseas this am. She denies abdominal pain   Objective: Vital signs in last 24 hours: Temp:  [97.7 F (36.5 C)-98.7 F (37.1 C)] 98 F (36.7 C) (01/09 0546) Pulse Rate:  [69-76] 76 (01/09 0546) Resp:  [16-18] 16 (01/09 0546) BP: (126-135)/(55-58) 135/55 (01/09 0546) SpO2:  [95 %-96 %] 95 % (01/09 0546) Weight:  [55.5 kg] 55.5 kg (01/09 0500) Last BM Date : 02/21/23  Intake/Output from previous day: 01/08 0701 - 01/09 0700 In: 1659.3 [I.V.:1659.3] Out: 750 [Emesis/NG output:750] Intake/Output this shift: No intake/output data recorded.  Exam: Awake and alert Abdomen ND. Minimal TTP over incision. Honeycomb removed and incision with steri strips cdi with some dried blood. NGT with bilious output  Lab Results:  No results for input(s): WBC, HGB, HCT, PLT in the last 72 hours.  BMET Recent Labs    02/20/23 0315 02/21/23 0341  NA 132* 135  K 4.1 4.0  CL 102 103  CO2 23 23  GLUCOSE 152* 125*  BUN 20 23  CREATININE 0.32* 0.52  CALCIUM  8.5* 8.6*   PT/INR No results for input(s): LABPROT, INR in the last 72 hours. ABG No results for input(s): PHART, HCO3 in the last 72 hours.  Invalid input(s): PCO2, PO2  Studies/Results: DG Abd Portable 1V Result Date: 02/20/2023 CLINICAL DATA:  NG tube placement EXAM: PORTABLE ABDOMEN - 1 VIEW COMPARISON:  02/16/2023 FINDINGS: NG tube is in the fundus of the stomach. Visualized lungs clear. Aortic atherosclerosis. Prior cholecystectomy. IMPRESSION: NG tube in the stomach. Electronically Signed   By: Franky Crease M.D.   On: 02/20/2023 21:27    Anti-infectives: Anti-infectives (From admission, onward)    Start     Dose/Rate Route Frequency Ordered Stop   02/16/23 0600  ceFAZolin  (ANCEF ) IVPB 2g/100 mL premix        2 g 200 mL/hr over 30  Minutes Intravenous On call to O.R. 02/15/23 1124 02/15/23 1313   02/15/23 1158  piperacillin -tazobactam (ZOSYN ) 3.375 GM/50ML IVPB  Status:  Discontinued       Note to Pharmacy: Gaines, Janel R: cabinet override      02/15/23 1158 02/15/23 1202   02/13/23 1200  piperacillin -tazobactam (ZOSYN ) IVPB 3.375 g  Status:  Discontinued        3.375 g 12.5 mL/hr over 240 Minutes Intravenous Every 8 hours 02/13/23 0918 02/15/23 0916   02/12/23 1230  amoxicillin -clavulanate (AUGMENTIN ) 875-125 MG per tablet 1 tablet  Status:  Discontinued        1 tablet Oral Every 12 hours 02/12/23 1131 02/13/23 0902   02/10/23 1400  piperacillin -tazobactam (ZOSYN ) IVPB 3.375 g  Status:  Discontinued        3.375 g 12.5 mL/hr over 240 Minutes Intravenous Every 8 hours 02/10/23 1205 02/12/23 1131   02/10/23 1000  amoxicillin -clavulanate (AUGMENTIN ) 875-125 MG per tablet 1 tablet  Status:  Discontinued        1 tablet Oral Every 12 hours 02/10/23 0809 02/10/23 1205   02/09/23 1700  piperacillin -tazobactam (ZOSYN ) IVPB 3.375 g        3.375 g 12.5 mL/hr over 240 Minutes Intravenous  Once 02/09/23 1652 02/09/23 2145       Assessment/Plan: SBO POD 7 s/p ex-lap with lysis of adhesive band x2 Dr. Signe  - having diarrhea and emesis yesterday. NGT replaced with 250 ml over last 24h  charted and 450 ml total in canister this am. Continue LIWS today. Check abd xray. - WBC normal. Afebrile - bmp pending -continue TPN  Having bowel function, afebrile and WBC normal. Check abd xray this am. But if she continues to have recurrent nausea/emesis then may need to consider CT scan for further evaluation  FEN: NPO, NGT LIWS ID: none currently VTE: lovenox     LOS: 11 days    [place] VEAR Mais, [facility] Surgery 02/21/2023, 8:05 AM Please see Amion for pager number during day hours 7:00am-4:30pm

## 2023-02-21 NOTE — Plan of Care (Signed)
  Problem: Education: Goal: Knowledge of General Education information will improve Description: Including pain rating scale, medication(s)/side effects and non-pharmacologic comfort measures Outcome: Progressing   Problem: Health Behavior/Discharge Planning: Goal: Ability to manage health-related needs will improve Outcome: Progressing   Problem: Clinical Measurements: Goal: Ability to maintain clinical measurements within normal limits will improve Outcome: Progressing Goal: Will remain free from infection Outcome: Progressing Goal: Diagnostic test results will improve Outcome: Progressing Goal: Respiratory complications will improve Outcome: Progressing   Problem: Activity: Goal: Risk for activity intolerance will decrease Outcome: Progressing   Problem: Nutrition: Goal: Adequate nutrition will be maintained Outcome: Progressing   Problem: Coping: Goal: Level of anxiety will decrease Outcome: Progressing   Problem: Elimination: Goal: Will not experience complications related to bowel motility Outcome: Progressing Goal: Will not experience complications related to urinary retention Outcome: Progressing   Problem: Pain Management: Goal: General experience of comfort will improve Outcome: Progressing   Problem: Safety: Goal: Ability to remain free from injury will improve Outcome: Progressing   Problem: Skin Integrity: Goal: Risk for impaired skin integrity will decrease Outcome: Progressing   Problem: Education: Goal: Ability to describe self-care measures that may prevent or decrease complications (Diabetes Survival Skills Education) will improve Outcome: Progressing Goal: Individualized Educational Video(s) Outcome: Progressing   Problem: Coping: Goal: Ability to adjust to condition or change in health will improve Outcome: Progressing   Problem: Fluid Volume: Goal: Ability to maintain a balanced intake and output will improve Outcome: Progressing    Problem: Health Behavior/Discharge Planning: Goal: Ability to identify and utilize available resources and services will improve Outcome: Progressing Goal: Ability to manage health-related needs will improve Outcome: Progressing   Problem: Metabolic: Goal: Ability to maintain appropriate glucose levels will improve Outcome: Progressing   Problem: Nutritional: Goal: Maintenance of adequate nutrition will improve Outcome: Progressing Goal: Progress toward achieving an optimal weight will improve Outcome: Progressing   Problem: Skin Integrity: Goal: Risk for impaired skin integrity will decrease Outcome: Progressing   Problem: Tissue Perfusion: Goal: Adequacy of tissue perfusion will improve Outcome: Progressing

## 2023-02-21 NOTE — Plan of Care (Signed)
 Problem: Education: Goal: Knowledge of General Education information will improve Description: Including pain rating scale, medication(s)/side effects and non-pharmacologic comfort measures 02/21/2023 0022 by Kathrynn Lavern LABOR, RN Outcome: Progressing 02/21/2023 0022 by Kathrynn Lavern LABOR, RN Outcome: Progressing   Problem: Health Behavior/Discharge Planning: Goal: Ability to manage health-related needs will improve 02/21/2023 0022 by Kathrynn Lavern LABOR, RN Outcome: Progressing 02/21/2023 0022 by Kathrynn Lavern LABOR, RN Outcome: Progressing   Problem: Clinical Measurements: Goal: Ability to maintain clinical measurements within normal limits will improve 02/21/2023 0022 by Kathrynn Lavern LABOR, RN Outcome: Progressing 02/21/2023 0022 by Kathrynn Lavern LABOR, RN Outcome: Progressing Goal: Will remain free from infection 02/21/2023 0022 by Kathrynn Lavern LABOR, RN Outcome: Progressing 02/21/2023 0022 by Kathrynn Lavern LABOR, RN Outcome: Progressing Goal: Diagnostic test results will improve 02/21/2023 0022 by Kathrynn Lavern LABOR, RN Outcome: Progressing 02/21/2023 0022 by Kathrynn Lavern LABOR, RN Outcome: Progressing Goal: Respiratory complications will improve 02/21/2023 0022 by Kathrynn Lavern LABOR, RN Outcome: Progressing 02/21/2023 0022 by Kathrynn Lavern LABOR, RN Outcome: Progressing   Problem: Activity: Goal: Risk for activity intolerance will decrease 02/21/2023 0022 by Kathrynn Lavern LABOR, RN Outcome: Progressing 02/21/2023 0022 by Kathrynn Lavern LABOR, RN Outcome: Progressing   Problem: Nutrition: Goal: Adequate nutrition will be maintained 02/21/2023 0022 by Kathrynn Lavern LABOR, RN Outcome: Progressing 02/21/2023 0022 by Kathrynn Lavern LABOR, RN Outcome: Progressing   Problem: Coping: Goal: Level of anxiety will decrease 02/21/2023 0022 by Kathrynn Lavern LABOR, RN Outcome: Progressing 02/21/2023 0022 by Kathrynn Lavern LABOR, RN Outcome: Progressing   Problem: Elimination: Goal: Will not experience  complications related to bowel motility 02/21/2023 0022 by Kathrynn Lavern LABOR, RN Outcome: Progressing 02/21/2023 0022 by Kathrynn Lavern LABOR, RN Outcome: Progressing Goal: Will not experience complications related to urinary retention 02/21/2023 0022 by Kathrynn Lavern LABOR, RN Outcome: Progressing 02/21/2023 0022 by Kathrynn Lavern LABOR, RN Outcome: Progressing   Problem: Pain Management: Goal: General experience of comfort will improve 02/21/2023 0022 by Kathrynn Lavern LABOR, RN Outcome: Progressing 02/21/2023 0022 by Kathrynn Lavern LABOR, RN Outcome: Progressing   Problem: Safety: Goal: Ability to remain free from injury will improve 02/21/2023 0022 by Kathrynn Lavern LABOR, RN Outcome: Progressing 02/21/2023 0022 by Kathrynn Lavern LABOR, RN Outcome: Progressing   Problem: Skin Integrity: Goal: Risk for impaired skin integrity will decrease 02/21/2023 0022 by Kathrynn Lavern LABOR, RN Outcome: Progressing 02/21/2023 0022 by Kathrynn Lavern LABOR, RN Outcome: Progressing   Problem: Education: Goal: Ability to describe self-care measures that may prevent or decrease complications (Diabetes Survival Skills Education) will improve 02/21/2023 0022 by Kathrynn Lavern LABOR, RN Outcome: Progressing 02/21/2023 0022 by Kathrynn Lavern LABOR, RN Outcome: Progressing Goal: Individualized Educational Video(s) 02/21/2023 0022 by Kathrynn Lavern LABOR, RN Outcome: Progressing 02/21/2023 0022 by Kathrynn Lavern LABOR, RN Outcome: Progressing   Problem: Coping: Goal: Ability to adjust to condition or change in health will improve 02/21/2023 0022 by Kathrynn Lavern LABOR, RN Outcome: Progressing 02/21/2023 0022 by Kathrynn Lavern LABOR, RN Outcome: Progressing   Problem: Fluid Volume: Goal: Ability to maintain a balanced intake and output will improve 02/21/2023 0022 by Kathrynn Lavern LABOR, RN Outcome: Progressing 02/21/2023 0022 by Kathrynn Lavern LABOR, RN Outcome: Progressing   Problem: Health Behavior/Discharge Planning: Goal: Ability to  identify and utilize available resources and services will improve 02/21/2023 0022 by Kathrynn Lavern LABOR, RN Outcome: Progressing 02/21/2023 0022 by Kathrynn Lavern LABOR, RN Outcome: Progressing Goal: Ability to manage health-related needs will improve 02/21/2023 0022 by Kathrynn Lavern LABOR, RN Outcome: Progressing 02/21/2023 0022 by Kathrynn,  Lavern LABOR, RN Outcome: Progressing   Problem: Metabolic: Goal: Ability to maintain appropriate glucose levels will improve 02/21/2023 0022 by Kathrynn Lavern LABOR, RN Outcome: Progressing 02/21/2023 0022 by Kathrynn Lavern LABOR, RN Outcome: Progressing   Problem: Nutritional: Goal: Maintenance of adequate nutrition will improve 02/21/2023 0022 by Kathrynn Lavern LABOR, RN Outcome: Progressing 02/21/2023 0022 by Kathrynn Lavern LABOR, RN Outcome: Progressing Goal: Progress toward achieving an optimal weight will improve 02/21/2023 0022 by Kathrynn Lavern LABOR, RN Outcome: Progressing 02/21/2023 0022 by Kathrynn Lavern LABOR, RN Outcome: Progressing   Problem: Skin Integrity: Goal: Risk for impaired skin integrity will decrease 02/21/2023 0022 by Kathrynn Lavern LABOR, RN Outcome: Progressing 02/21/2023 0022 by Kathrynn Lavern LABOR, RN Outcome: Progressing   Problem: Tissue Perfusion: Goal: Adequacy of tissue perfusion will improve 02/21/2023 0022 by Kathrynn Lavern LABOR, RN Outcome: Progressing 02/21/2023 0022 by Kathrynn Lavern LABOR, RN Outcome: Progressing

## 2023-02-21 NOTE — Progress Notes (Signed)
 PHARMACY - TOTAL PARENTERAL NUTRITION CONSULT NOTE   Indication: Prolonged ileus  Patient Measurements: Height: 5' 3 (160 cm) Weight: 55.5 kg (122 lb 5.7 oz) IBW/kg (Calculated) : 52.4 TPN AdjBW (KG): 53.5 Body mass index is 21.67 kg/m.  Assessment: 89 year old female with small bowel obstruction s/p ex lap and lysis of adhesions. Patient with prolonged lack of nutrition. Has NGT in place, currently to suction. Plan for TPN.  Glucose / Insulin : No Hx DM - CBGs stable and consistently at goal (100-150) - 3 units SSI used yesterday Electrolytes: Lytes all WNL (although Phos persistently on the low end of normal range) Renal: SCr low/stable; BUN WNL; not charting UOP Hepatic: albumin  consistently low/stable - Transaminases slightly up from 1/6; not particularly specific given hospital course, but could certainly be related to TPN - TG WNL 1/6 I/O:  - NG removed 1/7 but had to be replaced d/t persistent emesis - still having (loose) BMs - MD added D5-1/2NS at 40 ml/hr yesterday given ongoing emesis - now NPO - not ready to wean TPN yet per Surgery 1/9 GI Imaging: 1/3 AXR: ongoing SBO with unchanged diffuse gaseous distension of SB 1/4 AXR: improving SBO GI Surgeries / Procedures:  1/3 ex lap with lysis of adhesions  Central access: PICC placed 1/4 TPN start date: 1/4   Nutritional Goals: Goal TPN rate is 70 mL/hr (provides 84g of protein and 1647 kcals per day)  RD Assessment: (updated 1/6) Kcal 1500-1700 kcal/day Protein 75-85 g/day Fluid 1.7 L/day  Current Nutrition: CLD, TPN at 70 ml/hr  Plan:  At 1800: Continue TPN at 70 ml/hr Electrolytes in TPN: increase Na Na - 100 mEq/L K - 50 mEq/L Ca - 5 mEq/L Mg - 5 mEq/L Phos - 15 mmol/L Cl:Ac ratio - 1:1 Add standard MVI and trace elements to TPN Withholding chromium from TPN d/t critical shortage Can discontinue CBG checks/SSI given consistently good control on goal-rate TPN Continue D5-1/2 NS at 40 ml per MD  (ideally would prefer plain NS given dextrose  already in TPN, but given continued shortage of plain NS fluids, along with well-controlled CBGs, would continue with fluids as ordered for now) Monitor TPN labs on Mon/Thurs Would recheck LFTs before Monday  Bard Jeans, PharmD, BCPS [phone] 02/21/2023, 10:45 AM

## 2023-02-21 NOTE — Plan of Care (Signed)
  Problem: Education: Goal: Knowledge of General Education information will improve Description: Including pain rating scale, medication(s)/side effects and non-pharmacologic comfort measures Outcome: Progressing   Problem: Health Behavior/Discharge Planning: Goal: Ability to manage health-related needs will improve Outcome: Progressing   Problem: Clinical Measurements: Goal: Will remain free from infection Outcome: Progressing Goal: Diagnostic test results will improve Outcome: Progressing   Problem: Nutrition: Goal: Adequate nutrition will be maintained Outcome: Progressing   Problem: Coping: Goal: Level of anxiety will decrease Outcome: Progressing   Problem: Elimination: Goal: Will not experience complications related to bowel motility Outcome: Progressing Goal: Will not experience complications related to urinary retention Outcome: Progressing   Problem: Pain Management: Goal: General experience of comfort will improve Outcome: Progressing   Problem: Safety: Goal: Ability to remain free from injury will improve Outcome: Progressing   Problem: Skin Integrity: Goal: Risk for impaired skin integrity will decrease Outcome: Progressing   Problem: Education: Goal: Ability to describe self-care measures that may prevent or decrease complications (Diabetes Survival Skills Education) will improve Outcome: Progressing   Problem: Coping: Goal: Ability to adjust to condition or change in health will improve Outcome: Progressing   Problem: Fluid Volume: Goal: Ability to maintain a balanced intake and output will improve Outcome: Progressing   Problem: Nutritional: Goal: Maintenance of adequate nutrition will improve Outcome: Progressing

## 2023-02-21 NOTE — Progress Notes (Signed)
 PROGRESS NOTE Candice Brown  FMW:[id number] DOB: April 07, 1931 DOA: 02/09/2023 PCP: Shepard Ade, MD  Brief Narrative/Hospital Course: 89 year old PMH significant for hyperlipidemia, GERD, presented to the ED presented to hospital with abdominal pain, nausea vomiting and constipation for the last 24 hours.  She has a past medical history of hysterectomy, appendectomy and cholecystectomy.  CT abdomen and pelvis concern for short segment abdominal small bowel right lower quadrant regional inflammatory  edematous changes in the mesentery a small volume ascites.  Consideration for localized infectious, inflammatory or ischemic enteritis.  Patient was started on IV Zosyn  and was admitted to hospital for further evaluation and treatment.  During hospitalization, patient failed to improve repeated CT abdomen and pelvis 1/1 showed finding consistent with SBO.  General surgery consulted.  NG tube placed, small bowel protocol was started.  Patient did not improve so subsequently underwent surgical intervention 02/14/2022. CCS following, has been started on TPN  1/7-1/8> patient having vomiting, underwent NG tube placement 1/8 evening    Subjective: Patient seen and examined. Overnight afebrile BP stable  NGT was inserted again 1/8 evening due to recurrent vomiting  No more vomiting, having some nausea. Loose BM+ X-ray abdomen pending this morning  Assessment and Plan: Principal Problem:   SBO (small bowel obstruction) - closed loop - s/p exlap/lysis of adhesions 02/15/2023 Active Problems:   Malignant neoplasm of upper-outer quadrant of right breast in female, estrogen receptor positive (HCC)   Enteritis   HLD (hyperlipidemia)   GERD (gastroesophageal reflux disease)   Gastroenteritis   Protein-calorie malnutrition, severe  SBO Ileus Nausea vomiting and diarrhea: Initial CT abdomen showed enteritis managed conservatively, repeat CT scan done as patient did not improve showing SBO, seen by cc S/P ex  lap with LOA x 2 Dr. Cameron. Patient having ileus and also having  diarrhea. Vomiting- patient having vomiting, underwent NG tube placement 1/8 evening. Continue plan of care as per CCS team-follow-up x-ray, if persistent symptom?CT abd Cont on IVF/TPN   Hyperlipidemia PTA on Crestor  currently on hold    History of breast cancer Will follow-up as outpatient.   GERD: On ppi   Hyponatremia: Resolved.    Hypophosphatemia Hypokalemia: Resolved.Monitor for pharmacy while on TPN.     DVT prophylaxis: enoxaparin  (LOVENOX ) injection 40 mg Start: 02/12/23 2200 SCDs Start: 02/09/23 1825 Code Status:   Code Status: Full Code Family Communication: plan of care discussed with patient at bedside.  No family at the bedside. I had called Daughter Jeb 1/8  Patient status is: Remains hospitalized because of severity of illness Level of care: Med-Surg   Dispo: The patient is from: ILF at friend's home            Anticipated disposition: TBD.  Pending surgical clearance  Objective: Vitals last 24 hrs: Vitals:   02/20/23 1154 02/20/23 2054 02/21/23 0500 02/21/23 0546  BP: (!) 126/56 (!) 128/58  (!) 135/55  Pulse: 69 76  76  Resp: 16 18  16   Temp: 97.7 F (36.5 C) 98.7 F (37.1 C)  98 F (36.7 C)  TempSrc:  Oral  Oral  SpO2: 95% 96%  95%  Weight:   55.5 kg   Height:      Weight change: -0.501 kg  Physical Examination: General exam: alert awake, NGT+ HEENT:Oral mucosa moist, Ear/Nose WNL grossly Respiratory system: Bilaterally clear BS,no use of accessory muscle Cardiovascular system: S1 & S2 +, No JVD. Gastrointestinal system: Abdomen soft, nondistended, surgical site small amount of blood, healing, honeycomb DRESSING  REMOVED, BS sluggish Nervous System: Alert, awake, moving all extremities,and following commands. Extremities: LE edema neg,distal peripheral pulses palpable and warm.  Skin: No rashes,no icterus. MSK: Normal muscle bulk,tone, power   Medications reviewed:   Scheduled Meds:  Chlorhexidine  Gluconate Cloth  6 each Topical Daily   enoxaparin  (LOVENOX ) injection  40 mg Subcutaneous Q24H   insulin  aspart  0-9 Units Subcutaneous Q8H   lidocaine   1 patch Transdermal Q24H   pantoprazole  (PROTONIX ) IV  40 mg Intravenous Q12H   pregabalin   75 mg Oral Daily  Continuous Infusions:  dextrose  5 % and 0.9 % NaCl 40 mL/hr at 02/20/23 1442   TPN ADULT (ION) 70 mL/hr at 02/20/23 1725    Diet Order             Diet NPO time specified Except for: Ice Chips  Diet effective now                  Intake/Output Summary (Last 24 hours) at 02/21/2023 1031 Last data filed at 02/21/2023 1000 Gross per 24 hour  Intake 1659.32 ml  Output 1000 ml  Net 659.32 ml  Net IO Since Admission: 5,738.87 mL [02/21/23 1031]  Wt Readings from Last 3 Encounters:  02/21/23 55.5 kg  12/27/22 53.8 kg  06/05/22 52 kg    Unresulted Labs (From admission, onward)     Start     Ordered   02/18/23 0500  Comprehensive metabolic panel  (Standard TPN Labs (all labs to be drawn at 0500))  Every Mon,Thu (0500),   R     Question:  Specimen collection method  Answer:  Lab=Lab collect   02/16/23 0953   02/18/23 0500  Magnesium  (Standard TPN Labs (all labs to be drawn at 0500))  Every Mon,Thu (0500),   R     Question:  Specimen collection method  Answer:  Lab=Lab collect   02/16/23 0953   02/18/23 0500  Phosphorus  (Standard TPN Labs (all labs to be drawn at 0500))  Every Mon,Thu (0500),   R     Question:  Specimen collection method  Answer:  Lab=Lab collect   02/16/23 0953   02/18/23 0500  Triglycerides  (Standard TPN Labs (all labs to be drawn at 0500))  Every Monday (0500),   R     Question:  Specimen collection method  Answer:  Lab=Lab collect   02/16/23 0953          Data Reviewed: I have personally reviewed following labs and imaging studies CBC: Recent Labs  Lab 02/15/23 0538 02/16/23 0704 02/18/23 0419 02/21/23 0919  WBC 9.5 9.8 9.0 10.2  HGB 13.4 12.5 12.1 11.5*   HCT 39.7 37.9 35.3* 35.4*  MCV 98.5 100.0 99.2 102.0*  PLT 327 312 288 329   Basic Metabolic Panel:  Recent Labs  Lab 02/16/23 0704 02/17/23 0337 02/18/23 0419 02/19/23 0410 02/20/23 0315 02/21/23 0341 02/21/23 0919  NA 135 137 136 136 132* 135 136  K 3.5 3.3* 3.8 3.8 4.1 4.0 3.9  CL 102 103 104 104 102 103 105  CO2 25 26 25 24 23 23 24   GLUCOSE 77 105* 126* 142* 152* 125* 132*  BUN 23 20 15 19 20 23 23   CREATININE 0.64 0.58 0.50 0.43* 0.32* 0.52 0.45  CALCIUM  8.2* 8.1* 8.3* 8.4* 8.5* 8.6* 8.5*  MG 2.1 2.1 2.2  --   --  2.1  --   PHOS 2.6 1.8* 2.5  --   --  2.6  --  GFR: Estimated Creatinine Clearance: 39.4 mL/min (by C-G formula based on SCr of 0.45 mg/dL). Liver Function Tests:  Recent Labs  Lab 02/17/23 0337 02/18/23 0419 02/21/23 0341  AST 23 24 86*  ALT 19 19 94*  ALKPHOS 30* 40 123  BILITOT 0.6 0.6 0.3  PROT 5.2* 5.4* 5.9*  ALBUMIN  2.7* 2.8* 2.9*   No results for input(s): CHOL, HDL, LDLCALC, TRIG, CHOLHDL, LDLDIRECT in the last 72 hours. No results for input(s): TSH, T4TOTAL, FREET4, T3FREE, THYROIDAB in the last 72 hours. Sepsis Labs: No results for input(s): PROCALCITON, LATICACIDVEN in the last 168 hours. No results found for this or any previous visit (from the past 240 hours). Antimicrobials/Microbiology: Anti-infectives (From admission, onward)    Start     Dose/Rate Route Frequency Ordered Stop   02/16/23 0600  ceFAZolin  (ANCEF ) IVPB 2g/100 mL premix        2 g 200 mL/hr over 30 Minutes Intravenous On call to O.R. 02/15/23 1124 02/15/23 1313   02/15/23 1158  piperacillin -tazobactam (ZOSYN ) 3.375 GM/50ML IVPB  Status:  Discontinued       Note to Pharmacy: Gaines, Janel R: cabinet override      02/15/23 1158 02/15/23 1202   02/13/23 1200  piperacillin -tazobactam (ZOSYN ) IVPB 3.375 g  Status:  Discontinued        3.375 g 12.5 mL/hr over 240 Minutes Intravenous Every 8 hours 02/13/23 0918 02/15/23 0916   02/12/23 1230   amoxicillin -clavulanate (AUGMENTIN ) 875-125 MG per tablet 1 tablet  Status:  Discontinued        1 tablet Oral Every 12 hours 02/12/23 1131 02/13/23 0902   02/10/23 1400  piperacillin -tazobactam (ZOSYN ) IVPB 3.375 g  Status:  Discontinued        3.375 g 12.5 mL/hr over 240 Minutes Intravenous Every 8 hours 02/10/23 1205 02/12/23 1131   02/10/23 1000  amoxicillin -clavulanate (AUGMENTIN ) 875-125 MG per tablet 1 tablet  Status:  Discontinued        1 tablet Oral Every 12 hours 02/10/23 0809 02/10/23 1205   02/09/23 1700  piperacillin -tazobactam (ZOSYN ) IVPB 3.375 g        3.375 g 12.5 mL/hr over 240 Minutes Intravenous  Once 02/09/23 1652 02/09/23 2145         Component Value Date/Time   SDES  02/09/2023 1745    BLOOD LEFT ANTECUBITAL Performed at [facility], [address] [place], [place] [address]    SPECREQUEST  02/09/2023 1745    BOTTLES DRAWN AEROBIC AND ANAEROBIC Blood Culture results may not be optimal due to an inadequate volume of blood received in culture bottles Performed at [facility], [address] [place], [place] [address]    CULT  02/09/2023 1745    NO GROWTH 5 DAYS Performed at [facility], [address], [place], [place] [address]    REPTSTATUS 02/14/2023 FINAL 02/09/2023 1745     Radiology Studies: DG Abd Portable 1V Result Date: 02/20/2023 CLINICAL DATA:  NG tube placement EXAM: PORTABLE ABDOMEN - 1 VIEW COMPARISON:  02/16/2023 FINDINGS: NG tube is in the fundus of the stomach. Visualized lungs clear. Aortic atherosclerosis. Prior cholecystectomy. IMPRESSION: NG tube in the stomach. Electronically Signed   By: Franky Crease M.D.   On: 02/20/2023 21:27    LOS: 11 days   Total time spent in review of labs and imaging, patient evaluation, formulation of plan, documentation and communication with family: 35 minutes  Mennie LAMY, MD [facility]  02/21/2023, 10:31 AM

## 2023-02-21 NOTE — Progress Notes (Signed)
 Mobility Specialist - Progress Note   02/21/23 1550  Mobility  Activity Ambulated with assistance in hallway  Level of Assistance Contact guard assist, steadying assist  Assistive Device Front wheel walker  Distance Ambulated (ft) 160 ft  Range of Motion/Exercises Active  Activity Response Tolerated well  Mobility Referral Yes  Mobility visit 1 Mobility  Mobility Specialist Start Time (ACUTE ONLY) 1534  Mobility Specialist Stop Time (ACUTE ONLY) 1548  Mobility Specialist Time Calculation (min) (ACUTE ONLY) 14 min   Received in bed and agreed to mobility, contact for entire session. No complains throughout session, returned to bed with all needs met and family in room.  Cyndee Ada Mobility Specialist

## 2023-02-22 DIAGNOSIS — K56609 Unspecified intestinal obstruction, unspecified as to partial versus complete obstruction: Secondary | ICD-10-CM | POA: Diagnosis not present

## 2023-02-22 LAB — CBC
HCT: 33.7 % — ABNORMAL LOW (ref 36.0–46.0)
Hemoglobin: 10.7 g/dL — ABNORMAL LOW (ref 12.0–15.0)
MCH: 32.8 pg (ref 26.0–34.0)
MCHC: 31.8 g/dL (ref 30.0–36.0)
MCV: 103.4 fL — ABNORMAL HIGH (ref 80.0–100.0)
Platelets: 318 10*3/uL (ref 150–400)
RBC: 3.26 MIL/uL — ABNORMAL LOW (ref 3.87–5.11)
RDW: 12.4 % (ref 11.5–15.5)
WBC: 8 10*3/uL (ref 4.0–10.5)
nRBC: 0 % (ref 0.0–0.2)

## 2023-02-22 LAB — BASIC METABOLIC PANEL
Anion gap: 6 (ref 5–15)
BUN: 22 mg/dL (ref 8–23)
CO2: 24 mmol/L (ref 22–32)
Calcium: 8.5 mg/dL — ABNORMAL LOW (ref 8.9–10.3)
Chloride: 106 mmol/L (ref 98–111)
Creatinine, Ser: 0.43 mg/dL — ABNORMAL LOW (ref 0.44–1.00)
GFR, Estimated: >60 mL/min (ref >60)
Glucose, Bld: 133 mg/dL — ABNORMAL HIGH (ref 70–99)
Potassium: 4 mmol/L (ref 3.5–5.1)
Sodium: 136 mmol/L (ref 135–145)

## 2023-02-22 MED ORDER — TRAVASOL 10 % IV SOLN
INTRAVENOUS | Status: AC
  Filled 2023-02-22: qty 840

## 2023-02-22 MED ORDER — ACETAMINOPHEN 325 MG PO TABS
650.0000 mg | ORAL_TABLET | Freq: Four times a day (QID) | ORAL | Status: DC | PRN
  Administered 2023-02-22 – 2023-02-24 (×4): 650 mg via ORAL
  Filled 2023-02-22 (×4): qty 2

## 2023-02-22 NOTE — Progress Notes (Signed)
 PHARMACY - TOTAL PARENTERAL NUTRITION CONSULT NOTE   Indication: Prolonged ileus  Patient Measurements: Height: 5' 3 (160 cm) Weight: 55.5 kg (122 lb 5.7 oz) IBW/kg (Calculated) : 52.4 TPN AdjBW (KG): 53.5 Body mass index is 21.67 kg/m.  Assessment: 89 year old female with small bowel obstruction s/p ex lap and lysis of adhesions. Patient with prolonged lack of nutrition. Has NGT in place, currently to suction. Plan for TPN.  Glucose / Insulin : No Hx DM - CBGs stable and consistently at goal < 150, SSI d/c'd 1/9 Electrolytes: Lytes all WNL (although Phos persistently on the low end of normal range) Renal: SCr low/stable; BUN WNL; not charting UOP Hepatic: albumin  consistently low/stable - Transaminases slightly up from 1/6; not particularly specific given hospital course, but could certainly be related to TPN - TG WNL 1/6 I/O:  - NG removed 1/7 but had to be replaced d/t persistent emesis and now to removed again 1/10 - still having (loose) BMs - now CLD, advancing to FLD - continue TPN yet per Surgery 1/10 GI Imaging: 1/3 AXR: ongoing SBO with unchanged diffuse gaseous distension of SB 1/4 AXR: improving SBO GI Surgeries / Procedures:  1/3 ex lap with lysis of adhesions  Central access: PICC placed 1/4 TPN start date: 1/4   Nutritional Goals: Goal TPN rate is 70 mL/hr (provides 84g of protein and 1647 kcals per day)  RD Assessment: (updated 1/6) Kcal 1500-1700 kcal/day Protein 75-85 g/day Fluid 1.7 L/day  Current Nutrition: CLD - tolerating per CCS note, advance to FLD 1/10 - TPN at 70 ml/hr  Plan:  At 1800: Continue TPN at 70 ml/hr Electrolytes in TPN: increased Na Na - 100 mEq/L K - 50 mEq/L Ca - 5 mEq/L Mg - 5 mEq/L Phos - 15 mmol/L Cl:Ac ratio - 1:1 Add standard MVI and trace elements to TPN Withholding chromium from TPN d/t critical shortage Monitor TPN labs on Mon/Thurs Anticipate weaning of TPN beginning 1/11 if tolerates FLD 1/10   Eva CHRISTELLA Allis, PharmD, BCPS Secure Chat if ?s 02/22/2023 10:16 AM

## 2023-02-22 NOTE — TOC Progression Note (Signed)
 Transition of Care [facility]) - Progression Note    Patient Details  Name: Candice Brown MRN: [id number] Date of Birth: Jan 03, 1932  Transition of [facility]) CM/SW Contact  Hoy DELENA Bigness, LCSW Phone Number: 02/22/2023, 10:07 AM  Clinical Narrative:    [facility] holding SNF bed for pt to discharge to when medically stable. Insurance auth cancelled. Ins auth to be requested closer to pt being medically ready.         Expected Discharge Plan and Services                                               Social Determinants of Health (SDOH) Interventions SDOH Screenings   Food Insecurity: No Food Insecurity (02/16/2023)  Housing: Low Risk  (02/16/2023)  Transportation Needs: No Transportation Needs (02/16/2023)  Utilities: Not At Risk (02/16/2023)  Social Connections: Socially Integrated (02/16/2023)  Stress: No Stress Concern Present (07/21/2021)  Tobacco Use: High Risk (02/15/2023)    Readmission Risk Interventions     No data to display

## 2023-02-22 NOTE — Progress Notes (Signed)
 7 Days Post-Op   Subjective/Chief Complaint: Drank small volume clears without n/v/worsening abdominal pain or distension. Still having diarrhea but this has decreased. Abdominal pain well controlled  Objective: Vital signs in last 24 hours: Temp:  [97.9 F (36.6 C)-98.4 F (36.9 C)] 97.9 F (36.6 C) (01/10 0535) Pulse Rate:  [70-76] 70 (01/10 0535) Resp:  [18] 18 (01/10 0535) BP: (132-143)/(55-66) 143/66 (01/10 0535) SpO2:  [94 %-97 %] 94 % (01/10 0535) Last BM Date : 02/21/23  Intake/Output from previous day: 01/09 0701 - 01/10 0700 In: 2895.7 [P.O.:340; I.V.:2555.7] Out: 450 [Emesis/NG output:450] Intake/Output this shift: No intake/output data recorded.  Exam: Awake and alert Abdomen ND. Minimal TTP over incision. incision with steri strips cdi with some dried blood. NGT clamped  Lab Results:  Recent Labs    02/21/23 0919 02/22/23 0308  WBC 10.2 8.0  HGB 11.5* 10.7*  HCT 35.4* 33.7*  PLT 329 318    BMET Recent Labs    02/21/23 0919 02/22/23 0308  NA 136 136  K 3.9 4.0  CL 105 106  CO2 24 24  GLUCOSE 132* 133*  BUN 23 22  CREATININE 0.45 0.43*  CALCIUM  8.5* 8.5*   PT/INR No results for input(s): LABPROT, INR in the last 72 hours. ABG No results for input(s): PHART, HCO3 in the last 72 hours.  Invalid input(s): PCO2, PO2  Studies/Results: DG Abd Portable 1V Result Date: 02/21/2023 CLINICAL DATA:  Abdominal pain.  Ileus. EXAM: PORTABLE ABDOMEN - 1 VIEW COMPARISON:  Abdominal x-ray from yesterday. FINDINGS: Unchanged enteric tube within the stomach. Normal bowel gas pattern. No acute osseous abnormality. Prior cholecystectomy. IMPRESSION: 1. No acute findings. Electronically Signed   By: Elsie ONEIDA Shoulder M.D.   On: 02/21/2023 10:39   DG Abd Portable 1V Result Date: 02/20/2023 CLINICAL DATA:  NG tube placement EXAM: PORTABLE ABDOMEN - 1 VIEW COMPARISON:  02/16/2023 FINDINGS: NG tube is in the fundus of the stomach. Visualized lungs clear.  Aortic atherosclerosis. Prior cholecystectomy. IMPRESSION: NG tube in the stomach. Electronically Signed   By: Franky Crease M.D.   On: 02/20/2023 21:27    Anti-infectives: Anti-infectives (From admission, onward)    Start     Dose/Rate Route Frequency Ordered Stop   02/16/23 0600  ceFAZolin  (ANCEF ) IVPB 2g/100 mL premix        2 g 200 mL/hr over 30 Minutes Intravenous On call to O.R. 02/15/23 1124 02/15/23 1313   02/15/23 1158  piperacillin -tazobactam (ZOSYN ) 3.375 GM/50ML IVPB  Status:  Discontinued       Note to Pharmacy: Gaines, Janel R: cabinet override      02/15/23 1158 02/15/23 1202   02/13/23 1200  piperacillin -tazobactam (ZOSYN ) IVPB 3.375 g  Status:  Discontinued        3.375 g 12.5 mL/hr over 240 Minutes Intravenous Every 8 hours 02/13/23 0918 02/15/23 0916   02/12/23 1230  amoxicillin -clavulanate (AUGMENTIN ) 875-125 MG per tablet 1 tablet  Status:  Discontinued        1 tablet Oral Every 12 hours 02/12/23 1131 02/13/23 0902   02/10/23 1400  piperacillin -tazobactam (ZOSYN ) IVPB 3.375 g  Status:  Discontinued        3.375 g 12.5 mL/hr over 240 Minutes Intravenous Every 8 hours 02/10/23 1205 02/12/23 1131   02/10/23 1000  amoxicillin -clavulanate (AUGMENTIN ) 875-125 MG per tablet 1 tablet  Status:  Discontinued        1 tablet Oral Every 12 hours 02/10/23 0809 02/10/23 1205   02/09/23 1700  piperacillin -tazobactam (ZOSYN )  IVPB 3.375 g        3.375 g 12.5 mL/hr over 240 Minutes Intravenous  Once 02/09/23 1652 02/09/23 2145       Assessment/Plan: SBO POD 8 s/p ex-lap with lysis of adhesive band x2 Dr. Signe  - diarrhea improving. Tolerating cld. Dc NGT and advance to FLD - WBC normal. Afebrile -continue TPN  FEN: FLD, TPN ID: none currently VTE: lovenox     LOS: 12 days    [place] VEAR Mais, [facility] Surgery 02/22/2023, 8:17 AM Please see Amion for pager number during day hours 7:00am-4:30pm

## 2023-02-22 NOTE — Plan of Care (Signed)
  Problem: Nutrition: Goal: Adequate nutrition will be maintained Outcome: Progressing   Problem: Nutritional: Goal: Maintenance of adequate nutrition will improve Outcome: Progressing   Problem: Skin Integrity: Goal: Risk for impaired skin integrity will decrease Outcome: Progressing

## 2023-02-22 NOTE — Progress Notes (Signed)
 PROGRESS NOTE Candice Brown  FMW:[id number] DOB: 05/20/1931 DOA: 02/09/2023 PCP: Shepard Ade, MD  Brief Narrative/Hospital Course: 89 year old PMH significant for hyperlipidemia, GERD, presented to the ED presented to hospital with abdominal pain, nausea vomiting and constipation for the last 24 hours.  She has a past medical history of hysterectomy, appendectomy and cholecystectomy.  CT abdomen and pelvis concern for short segment abdominal small bowel right lower quadrant regional inflammatory  edematous changes in the mesentery a small volume ascites.  Consideration for localized infectious, inflammatory or ischemic enteritis.  Patient was started on IV Zosyn  and was admitted to hospital for further evaluation and treatment.  During hospitalization, patient failed to improve repeated CT abdomen and pelvis 1/1 showed finding consistent with SBO.  General surgery consulted.  NG tube placed, small bowel protocol was started.  Patient did not improve so subsequently underwent surgical intervention 02/14/2022. CCS following, has been started on TPN  1/7-1/8> patient having vomiting, underwent NG tube placement 1/8 evening    Subjective: Patient seen and examined Overnight afebrile BP stable, labs remains stable  NG output 450 cc BM x 5 yesterday NG is out tolerating clears without worsening nausea or vomiting or abdominal pain or distention  Assessment and Plan: Principal Problem:   SBO (small bowel obstruction) - closed loop - s/p exlap/lysis of adhesions 02/15/2023 Active Problems:   Malignant neoplasm of upper-outer quadrant of right breast in female, estrogen receptor positive (HCC)   Enteritis   HLD (hyperlipidemia)   GERD (gastroesophageal reflux disease)   Gastroenteritis   Protein-calorie malnutrition, severe  SBO Ileus Nausea vomiting and diarrhea: Initial CT abdomen showed enteritis managed conservatively, repeat CT scan done as patient did not improve showing SBO, seen by cc S/P  ex lap with LOA x 2 Dr. Cameron. Patient having ileus and also having  diarrhea AND recurrent vomiting NG tube reinserted 1/8 PM.  Xray 1/9> no acute finding.  Still having diarrhea but no abdominal distention nausea vomiting NG tube discontinued 1/1 continue diet as per CCS and TPN PPI ooB   Hyperlipidemia Home Crestor  on hold    History of breast cancer Will follow-up as outpatient.  Anemia: hemoglobin at 10 to 11 g.  Monitor  GERD: On ppi   Hyponatremia: Hypophosphatemia Hypokalemia: Electrolytes stable.  Continue TPN   DVT prophylaxis: enoxaparin  (LOVENOX ) injection 40 mg Start: 02/12/23 2200 SCDs Start: 02/09/23 1825 Code Status:   Code Status: Full Code Family Communication: plan of care discussed with patient at bedside.  Daughter updated at the bedside  Patient status is: Remains hospitalized because of severity of illness Level of care: Med-Surg   Dispo: The [facility]            Anticipated disposition: snf ~2 DAYS  Objective: Vitals last 24 hrs: Vitals:   02/21/23 0546 02/21/23 1151 02/21/23 2024 02/22/23 0535  BP: (!) 135/55 (!) 132/56 (!) 138/55 (!) 143/66  Pulse: 76 76 75 70  Resp: 16 18 18 18   Temp: 98 F (36.7 C) 98 F (36.7 C) 98.4 F (36.9 C) 97.9 F (36.6 C)  TempSrc: Oral     SpO2: 95% 97% 95% 94%  Weight:      Height:      Weight change:   Physical Examination: General exam: alert awake, thin frail  HEENT:Oral mucosa moist, Ear/Nose WNL grossly Respiratory system: Bilaterally clear BS,no use of accessory muscle Cardiovascular system: S1 & S2 +, No JVD. Gastrointestinal system: Abdomen soft, surgical  site with dressing C/C/I, no distention  MILDLY tender BS present  Nervous System: Alert, awake, moving all extremities,and following commands. Extremities: LE edema neg,distal peripheral pulses palpable and warm.  Skin: No rashes,no icterus. MSK: THIN muscle bulk,tone, power   Medications reviewed:  Scheduled  Meds:  Chlorhexidine  Gluconate Cloth  6 each Topical Daily   enoxaparin  (LOVENOX ) injection  40 mg Subcutaneous Q24H   lidocaine   1 patch Transdermal Q24H   pantoprazole  (PROTONIX ) IV  40 mg Intravenous Q12H   pregabalin   75 mg Oral Daily  Continuous Infusions:  TPN ADULT (ION) 70 mL/hr at 02/21/23 1757   TPN ADULT (ION)      Diet Order             Diet clear liquid Room service appropriate? Yes; Fluid consistency: Thin  Diet effective now                  Intake/Output Summary (Last 24 hours) at 02/22/2023 1131 Last data filed at 02/21/2023 1946 Gross per 24 hour  Intake 2895.66 ml  Output 200 ml  Net 2695.66 ml  Net IO Since Admission: 8,434.53 mL [02/22/23 1131]  Wt Readings from Last 3 Encounters:  02/21/23 55.5 kg  12/27/22 53.8 kg  06/05/22 52 kg    Unresulted Labs (From admission, onward)     Start     Ordered   02/22/23 0500  Basic metabolic panel  Daily,   R     Question:  Specimen collection method  Answer:  IV Team=IV Team collect   02/21/23 1032   02/22/23 0500  CBC  Daily,   R     Question:  Specimen collection method  Answer:  IV Team=IV Team collect   02/21/23 1032   02/18/23 0500  Comprehensive metabolic panel  (Standard TPN Labs (all labs to be drawn at 0500))  Every Mon,Thu (0500),   R     Question:  Specimen collection method  Answer:  Lab=Lab collect   02/16/23 0953   02/18/23 0500  Magnesium  (Standard TPN Labs (all labs to be drawn at 0500))  Every Mon,Thu (0500),   R     Question:  Specimen collection method  Answer:  Lab=Lab collect   02/16/23 0953   02/18/23 0500  Phosphorus  (Standard TPN Labs (all labs to be drawn at 0500))  Every Mon,Thu (0500),   R     Question:  Specimen collection method  Answer:  Lab=Lab collect   02/16/23 0953   02/18/23 0500  Triglycerides  (Standard TPN Labs (all labs to be drawn at 0500))  Every Monday (0500),   R     Question:  Specimen collection method  Answer:  Lab=Lab collect   02/16/23 0953           Data Reviewed: I have personally reviewed following labs and imaging studies CBC: Recent Labs  Lab 02/16/23 0704 02/18/23 0419 02/21/23 0919 02/22/23 0308  WBC 9.8 9.0 10.2 8.0  HGB 12.5 12.1 11.5* 10.7*  HCT 37.9 35.3* 35.4* 33.7*  MCV 100.0 99.2 102.0* 103.4*  PLT 312 288 329 318   Basic Metabolic Panel:  Recent Labs  Lab 02/16/23 0704 02/17/23 0337 02/18/23 0419 02/19/23 0410 02/20/23 0315 02/21/23 0341 02/21/23 0919 02/22/23 0308  NA 135 137 136 136 132* 135 136 136  K 3.5 3.3* 3.8 3.8 4.1 4.0 3.9 4.0  CL 102 103 104 104 102 103 105 106  CO2 25 26 25 24 23 23 24  24  GLUCOSE 77 105* 126* 142* 152* 125* 132* 133*  BUN 23 20 15 19 20 23 23 22   CREATININE 0.64 0.58 0.50 0.43* 0.32* 0.52 0.45 0.43*  CALCIUM  8.2* 8.1* 8.3* 8.4* 8.5* 8.6* 8.5* 8.5*  MG 2.1 2.1 2.2  --   --  2.1  --   --   PHOS 2.6 1.8* 2.5  --   --  2.6  --   --    GFR: Estimated Creatinine Clearance: 39.4 mL/min (A) (by C-G formula based on SCr of 0.43 mg/dL (L)). Liver Function Tests:  Recent Labs  Lab 02/17/23 0337 02/18/23 0419 02/21/23 0341  AST 23 24 86*  ALT 19 19 94*  ALKPHOS 30* 40 123  BILITOT 0.6 0.6 0.3  PROT 5.2* 5.4* 5.9*  ALBUMIN  2.7* 2.8* 2.9*   No results for input(s): CHOL, HDL, LDLCALC, TRIG, CHOLHDL, LDLDIRECT in the last 72 hours. No results for input(s): TSH, T4TOTAL, FREET4, T3FREE, THYROIDAB in the last 72 hours. Sepsis Labs: No results for input(s): PROCALCITON, LATICACIDVEN in the last 168 hours. No results found for this or any previous visit (from the past 240 hours). Antimicrobials/Microbiology: Anti-infectives (From admission, onward)    Start     Dose/Rate Route Frequency Ordered Stop   02/16/23 0600  ceFAZolin  (ANCEF ) IVPB 2g/100 mL premix        2 g 200 mL/hr over 30 Minutes Intravenous On call to O.R. 02/15/23 1124 02/15/23 1313   02/15/23 1158  piperacillin -tazobactam (ZOSYN ) 3.375 GM/50ML IVPB  Status:  Discontinued       Note  to Pharmacy: Gaines, Janel R: cabinet override      02/15/23 1158 02/15/23 1202   02/13/23 1200  piperacillin -tazobactam (ZOSYN ) IVPB 3.375 g  Status:  Discontinued        3.375 g 12.5 mL/hr over 240 Minutes Intravenous Every 8 hours 02/13/23 0918 02/15/23 0916   02/12/23 1230  amoxicillin -clavulanate (AUGMENTIN ) 875-125 MG per tablet 1 tablet  Status:  Discontinued        1 tablet Oral Every 12 hours 02/12/23 1131 02/13/23 0902   02/10/23 1400  piperacillin -tazobactam (ZOSYN ) IVPB 3.375 g  Status:  Discontinued        3.375 g 12.5 mL/hr over 240 Minutes Intravenous Every 8 hours 02/10/23 1205 02/12/23 1131   02/10/23 1000  amoxicillin -clavulanate (AUGMENTIN ) 875-125 MG per tablet 1 tablet  Status:  Discontinued        1 tablet Oral Every 12 hours 02/10/23 0809 02/10/23 1205   02/09/23 1700  piperacillin -tazobactam (ZOSYN ) IVPB 3.375 g        3.375 g 12.5 mL/hr over 240 Minutes Intravenous  Once 02/09/23 1652 02/09/23 2145         Component Value Date/Time   SDES  02/09/2023 1745    BLOOD LEFT ANTECUBITAL Performed at [facility], [address] [place], [place] [address]    SPECREQUEST  02/09/2023 1745    BOTTLES DRAWN AEROBIC AND ANAEROBIC Blood Culture results may not be optimal due to an inadequate volume of blood received in culture bottles Performed at [facility], [address] [place], [place] [address]    CULT  02/09/2023 1745    NO GROWTH 5 DAYS Performed at [facility], [address] [place], [place] [address]    REPTSTATUS 02/14/2023 FINAL 02/09/2023 1745     Radiology Studies: DG Abd Portable 1V Result Date: 02/21/2023 CLINICAL DATA:  Abdominal pain.  Ileus. EXAM: PORTABLE ABDOMEN - 1 VIEW  COMPARISON:  Abdominal x-ray from yesterday. FINDINGS: Unchanged enteric tube within the stomach. Normal bowel gas pattern. No acute osseous abnormality. Prior cholecystectomy. IMPRESSION: 1. No acute findings. Electronically  Signed   By: Elsie ONEIDA Shoulder M.D.   On: 02/21/2023 10:39   DG Abd Portable 1V Result Date: 02/20/2023 CLINICAL DATA:  NG tube placement EXAM: PORTABLE ABDOMEN - 1 VIEW COMPARISON:  02/16/2023 FINDINGS: NG tube is in the fundus of the stomach. Visualized lungs clear. Aortic atherosclerosis. Prior cholecystectomy. IMPRESSION: NG tube in the stomach. Electronically Signed   By: Franky Crease M.D.   On: 02/20/2023 21:27    LOS: 12 days   Total time spent in review of labs and imaging, patient evaluation, formulation of plan, documentation and communication with family: 35 minutes  Mennie LAMY, MD [facility]  02/22/2023, 11:31 AM

## 2023-02-22 NOTE — Progress Notes (Signed)
 Physical Therapy Treatment Patient Details Name: Candice Brown MRN: [id number] DOB: 1931/09/17 Today's Date: 02/22/2023   History of Present Illness 89 year old admitted with SBO and s/p exlap/lysis of adhesions 02/15/2023.  PMH significant for hyperlipidemia, GERD, hiatal hernia, diverticulosis, occipital neuralgia, peripheral neuropathy, hysterectomy, appendectomy and cholecystectomy.    PT Comments  Pt making good progress.  She does need min A for transfers and CGA to steady with ambulation.  She is normally at ILF and independent.  Fall risk and needs assist at this time - recommend SNF as pt lives alone.  [facility] at [facility] and reports plans to return to their SNF.      If plan is discharge home, recommend the following: A little help with walking and/or transfers;A little help with bathing/dressing/bathroom;Assistance with cooking/housework   Can travel by private vehicle     Yes  Equipment Recommendations  None recommended by PT    Recommendations for Other Services       Precautions / Restrictions Precautions Precautions: Fall Restrictions Weight Bearing Restrictions Per Provider Order: No     Mobility  Bed Mobility Overal bed mobility: Needs Assistance Bed Mobility: Rolling, Sidelying to Sit Rolling: Supervision, Used rails Sidelying to sit: Min assist, Used rails, HOB elevated       General bed mobility comments: Log roll technique with rail and min A to lift trunk    Transfers Overall transfer level: Needs assistance Equipment used: Rolling walker (2 wheels) Transfers: Sit to/from Stand Sit to Stand: Contact guard assist           General transfer comment: cues for hand placement    Ambulation/Gait Ambulation/Gait assistance: Contact guard assist Gait Distance (Feet): 200 Feet Assistive device: Rolling walker (2 wheels) Gait Pattern/deviations: Step-through pattern, Decreased stride length Gait velocity: decreased but functional     General  Gait Details: min cues for posture with good carry over; did need CGA for steadying   Stairs             Wheelchair Mobility     Tilt Bed    Modified Rankin (Stroke Patients Only)       Balance Overall balance assessment: Needs assistance Sitting-balance support: No upper extremity supported Sitting balance-Leahy Scale: Good     Standing balance support: Reliant on assistive device for balance, Bilateral upper extremity supported Standing balance-Leahy Scale: Poor Standing balance comment: RW and CGA                            Cognition Arousal: Alert Behavior During Therapy: WFL for tasks assessed/performed Overall Cognitive Status: Within Functional Limits for tasks assessed                                 General Comments: pleasant/motivated        Exercises      General Comments General comments (skin integrity, edema, etc.): VSS. Pt is active and enjoys ballroom dancing - discussed possible outpt PT in future for core strengthening once heals from surgery/cleared by surgeon.  Did discuss could begin gentle pelvic tilts in supine Also educated on positions of comfort for sleep     Pertinent Vitals/Pain Pain Assessment Pain Assessment: 0-10 Pain Score: 1  Pain Location: surgical site Pain Descriptors / Indicators: Guarding Pain Intervention(s): Limited activity within patient's tolerance, Monitored during session    Home Living  Prior Function            PT Goals (current goals can now be found in the care plan section) Progress towards PT goals: Progressing toward goals    Frequency    Min 1X/week      PT Plan      Co-evaluation              AM-PAC PT 6 Clicks Mobility   Outcome Measure  Help needed turning from your back to your side while in a flat bed without using bedrails?: A Little Help needed moving from lying on your back to sitting on the side of a flat  bed without using bedrails?: A Little Help needed moving to and from a bed to a chair (including a wheelchair)?: A Little Help needed standing up from a chair using your arms (e.g., wheelchair or bedside chair)?: A Little Help needed to walk in hospital room?: A Little Help needed climbing 3-5 steps with a railing? : A Lot 6 Click Score: 17    End of Session   Activity Tolerance: Patient tolerated treatment well Patient left: in chair;with call bell/phone within reach Nurse Communication: Mobility status PT Visit Diagnosis: Difficulty in walking, not elsewhere classified (R26.2)     Time: 8988-8970 PT Time Calculation (min) (ACUTE ONLY): 18 min  Charges:    $Gait Training: 8-22 mins PT General Charges $$ ACUTE PT VISIT: 1 Visit                     Candice, PT Acute Rehab Services [facility] [phone]    Candice Brown [place] 02/22/2023, 10:54 AM

## 2023-02-23 DIAGNOSIS — K56609 Unspecified intestinal obstruction, unspecified as to partial versus complete obstruction: Secondary | ICD-10-CM | POA: Diagnosis not present

## 2023-02-23 LAB — BASIC METABOLIC PANEL
Anion gap: 9 (ref 5–15)
BUN: 21 mg/dL (ref 8–23)
CO2: 23 mmol/L (ref 22–32)
Calcium: 8.9 mg/dL (ref 8.9–10.3)
Chloride: 105 mmol/L (ref 98–111)
Creatinine, Ser: 0.59 mg/dL (ref 0.44–1.00)
GFR, Estimated: >60 mL/min (ref >60)
Glucose, Bld: 114 mg/dL — ABNORMAL HIGH (ref 70–99)
Potassium: 4.1 mmol/L (ref 3.5–5.1)
Sodium: 137 mmol/L (ref 135–145)

## 2023-02-23 LAB — CBC
HCT: 34 % — ABNORMAL LOW (ref 36.0–46.0)
Hemoglobin: 11.3 g/dL — ABNORMAL LOW (ref 12.0–15.0)
MCH: 33.5 pg (ref 26.0–34.0)
MCHC: 33.2 g/dL (ref 30.0–36.0)
MCV: 100.9 fL — ABNORMAL HIGH (ref 80.0–100.0)
Platelets: 353 10*3/uL (ref 150–400)
RBC: 3.37 MIL/uL — ABNORMAL LOW (ref 3.87–5.11)
RDW: 12.3 % (ref 11.5–15.5)
WBC: 9 10*3/uL (ref 4.0–10.5)
nRBC: 0 % (ref 0.0–0.2)

## 2023-02-23 MED ORDER — PANTOPRAZOLE SODIUM 40 MG PO TBEC
40.0000 mg | DELAYED_RELEASE_TABLET | Freq: Two times a day (BID) | ORAL | Status: DC
Start: 2023-02-23 — End: 2023-02-26
  Administered 2023-02-23 – 2023-02-26 (×7): 40 mg via ORAL
  Filled 2023-02-23 (×7): qty 1

## 2023-02-23 NOTE — Progress Notes (Signed)
 PROGRESS NOTE MALISHA MABEY  FMW:[id number] DOB: March 23, 1931 DOA: 02/09/2023 PCP: Shepard Ade, MD  Brief Narrative/Hospital Course: 89 year old PMH significant for hyperlipidemia, GERD, presented to the ED presented to hospital with abdominal pain, nausea vomiting and constipation for the last 24 hours.  She has a past medical history of hysterectomy, appendectomy and cholecystectomy.  CT abdomen and pelvis concern for short segment abdominal small bowel right lower quadrant regional inflammatory  edematous changes in the mesentery a small volume ascites.  Consideration for localized infectious, inflammatory or ischemic enteritis.  Patient was started on IV Zosyn  and was admitted to hospital for further evaluation and treatment.  During hospitalization, patient failed to improve repeated CT abdomen and pelvis 1/1 showed finding consistent with SBO.  General surgery consulted.  NG tube placed, small bowel protocol was started.  Patient did not improve so subsequently underwent surgical intervention 02/14/2022. CCS following, has been started on TPN  1/7-1/8> patient having vomiting, underwent NG tube placement 1/8 evening    Subjective: Patient seen and examined Overnight patient had afebrile BP stable labs with stable anemia slightly trending up  BM x 1 yesterday overall feeling much improved somewhat upset that she could not order a solid meal this morning as liquid meal has been already delivered Has not needed any Zofran .  Assessment and Plan: Principal Problem:   SBO (small bowel obstruction) - closed loop - s/p exlap/lysis of adhesions 02/15/2023 Active Problems:   Malignant neoplasm of upper-outer quadrant of right breast in female, estrogen receptor positive (HCC)   Enteritis   HLD (hyperlipidemia)   GERD (gastroesophageal reflux disease)   Gastroenteritis   Protein-calorie malnutrition, severe  SBO S/P ex lap with LOA x 2 Dr. Cameron. Ileus subsequent postop: Initial CT abdomen  showed enteritis managed conservatively, repeat CT scan done as patient did not improve showed SBO s/p ex lap LO. Post op having ileus nausea vomiting diarrhea>NG tube reinserted 1/8 and removed 1/10 am. On soft diet continue as per CCS.  Diarrhea improving.  Clinically much better Cont TPN and hopefully can wean off soon. Cont PTOT OOB.   Hyperlipidemia Home Crestor  on hold    History of breast cancer Will follow-up as outpatient.  Anemia: hb overall stable   GERD: Cont ppi   Hyponatremia: Hypophosphatemia Hypokalemia: Electrolytes stable w/ normal potassium and sodium.Continue TPN   DVT prophylaxis: enoxaparin  (LOVENOX ) injection 40 mg Start: 02/12/23 2200 SCDs Start: 02/09/23 1825 Code Status:   Code Status: Full Code Family Communication: plan of care discussed with patient at bedside.  Daughter updated at the bedside 1/10  Patient status is: Remains hospitalized because of severity of illness Level of care: Med-Surg   Dispo: The patient is from: ILF at [facility] home            Anticipated disposition: snf ~2 DAYS  Objective: Vitals last 24 hrs: Vitals:   02/21/23 2024 02/22/23 0535 02/22/23 1457 02/22/23 1957  BP: (!) 138/55 (!) 143/66 (!) 110/48 129/66  Pulse: 75 70 70 66  Resp: 18 18 16 15   Temp: 98.4 F (36.9 C) 97.9 F (36.6 C) 98.2 F (36.8 C) 97.9 F (36.6 C)  TempSrc:      SpO2: 95% 94% 96% 95%  Weight:      Height:      Weight change:   Physical Examination: General exam: alert awake, pleasant, comfortable on room air HEENT:Oral mucosa moist, Ear/Nose WNL grossly Respiratory system: Bilaterally clear BS,no use of accessory muscle Cardiovascular system: S1 &  S2 +, No JVD. Gastrointestinal system: Abdomen soft, surgical site healing C/D/I,  BS+ Nervous System: Alert, awake, moving all extremities,and following commands. Extremities: LE edema neg,distal peripheral pulses palpable and warm.  Skin: No rashes,no icterus. MSK: Normal muscle bulk,tone,  power   Medications reviewed:  Scheduled Meds:  Chlorhexidine  Gluconate Cloth  6 each Topical Daily   enoxaparin  (LOVENOX ) injection  40 mg Subcutaneous Q24H   lidocaine   1 patch Transdermal Q24H   pantoprazole   40 mg Oral BID   pregabalin   75 mg Oral Daily  Continuous Infusions:  TPN ADULT (ION) 70 mL/hr at 02/23/23 0257    Diet Order             DIET SOFT Room service appropriate? Yes; Fluid consistency: Thin  Diet effective now                  Intake/Output Summary (Last 24 hours) at 02/23/2023 1007 Last data filed at 02/23/2023 0257 Gross per 24 hour  Intake 675.4 ml  Output --  Net 675.4 ml  Net IO Since Admission: 9,109.93 mL [02/23/23 1007]  Wt Readings from Last 3 Encounters:  02/21/23 55.5 kg  12/27/22 53.8 kg  06/05/22 52 kg    Unresulted Labs (From admission, onward)     Start     Ordered   02/22/23 0500  Basic metabolic panel  Daily,   R     Question:  Specimen collection method  Answer:  IV Team=IV Team collect   02/21/23 1032   02/22/23 0500  CBC  Daily,   R     Question:  Specimen collection method  Answer:  IV Team=IV Team collect   02/21/23 1032          Data Reviewed: I have personally reviewed following labs and imaging studies CBC: Recent Labs  Lab 02/18/23 0419 02/21/23 0919 02/22/23 0308 02/23/23 0034  WBC 9.0 10.2 8.0 9.0  HGB 12.1 11.5* 10.7* 11.3*  HCT 35.3* 35.4* 33.7* 34.0*  MCV 99.2 102.0* 103.4* 100.9*  PLT 288 329 318 353   Basic Metabolic Panel:  Recent Labs  Lab 02/17/23 0337 02/18/23 0419 02/19/23 0410 02/20/23 0315 02/21/23 0341 02/21/23 0919 02/22/23 0308 02/23/23 0034  NA 137 136   < > 132* 135 136 136 137  K 3.3* 3.8   < > 4.1 4.0 3.9 4.0 4.1  CL 103 104   < > 102 103 105 106 105  CO2 26 25   < > 23 23 24 24 23   GLUCOSE 105* 126*   < > 152* 125* 132* 133* 114*  BUN 20 15   < > 20 23 23 22 21   CREATININE 0.58 0.50   < > 0.32* 0.52 0.45 0.43* 0.59  CALCIUM  8.1* 8.3*   < > 8.5* 8.6* 8.5* 8.5* 8.9  MG  2.1 2.2  --   --  2.1  --   --   --   PHOS 1.8* 2.5  --   --  2.6  --   --   --    < > = values in this interval not displayed.   GFR: Estimated Creatinine Clearance: 39.4 mL/min (by C-G formula based on SCr of 0.59 mg/dL). Liver Function Tests:  Recent Labs  Lab 02/17/23 0337 02/18/23 0419 02/21/23 0341  AST 23 24 86*  ALT 19 19 94*  ALKPHOS 30* 40 123  BILITOT 0.6 0.6 0.3  PROT 5.2* 5.4* 5.9*  ALBUMIN  2.7* 2.8* 2.9*   No  results for input(s): CHOL, HDL, LDLCALC, TRIG, CHOLHDL, LDLDIRECT in the last 72 hours. No results for input(s): TSH, T4TOTAL, FREET4, T3FREE, THYROIDAB in the last 72 hours. Sepsis Labs: No results for input(s): PROCALCITON, LATICACIDVEN in the last 168 hours. No results found for this or any previous visit (from the past 240 hours). Antimicrobials/Microbiology: Anti-infectives (From admission, onward)    Start     Dose/Rate Route Frequency Ordered Stop   02/16/23 0600  ceFAZolin  (ANCEF ) IVPB 2g/100 mL premix        2 g 200 mL/hr over 30 Minutes Intravenous On call to O.R. 02/15/23 1124 02/15/23 1313   02/15/23 1158  piperacillin -tazobactam (ZOSYN ) 3.375 GM/50ML IVPB  Status:  Discontinued       Note to Pharmacy: Gaines, Janel R: cabinet override      02/15/23 1158 02/15/23 1202   02/13/23 1200  piperacillin -tazobactam (ZOSYN ) IVPB 3.375 g  Status:  Discontinued        3.375 g 12.5 mL/hr over 240 Minutes Intravenous Every 8 hours 02/13/23 0918 02/15/23 0916   02/12/23 1230  amoxicillin -clavulanate (AUGMENTIN ) 875-125 MG per tablet 1 tablet  Status:  Discontinued        1 tablet Oral Every 12 hours 02/12/23 1131 02/13/23 0902   02/10/23 1400  piperacillin -tazobactam (ZOSYN ) IVPB 3.375 g  Status:  Discontinued        3.375 g 12.5 mL/hr over 240 Minutes Intravenous Every 8 hours 02/10/23 1205 02/12/23 1131   02/10/23 1000  amoxicillin -clavulanate (AUGMENTIN ) 875-125 MG per tablet 1 tablet  Status:  Discontinued        1 tablet Oral  Every 12 hours 02/10/23 0809 02/10/23 1205   02/09/23 1700  piperacillin -tazobactam (ZOSYN ) IVPB 3.375 g        3.375 g 12.5 mL/hr over 240 Minutes Intravenous  Once 02/09/23 1652 02/09/23 2145         Component Value Date/Time   SDES  02/09/2023 1745    BLOOD LEFT ANTECUBITAL Performed at [facility], [address] [place], [place] [address]    SPECREQUEST  02/09/2023 1745    BOTTLES DRAWN AEROBIC AND ANAEROBIC Blood Culture results may not be optimal due to an inadequate volume of blood received in culture bottles Performed at [facility], [address], [place], [place] [address]    CULT  02/09/2023 1745    NO GROWTH 5 DAYS Performed at [facility], [address], [place], [place] [address]    REPTSTATUS 02/14/2023 FINAL 02/09/2023 1745     Radiology Studies: No results found.   LOS: 13 days   Total time spent in review of labs and imaging, patient evaluation, formulation of plan, documentation and communication with family: 35 minutes  Mennie LAMY, MD [facility]  02/23/2023, 10:07 AM

## 2023-02-23 NOTE — Plan of Care (Signed)
  Problem: Education: Goal: Knowledge of General Education information will improve Description: Including pain rating scale, medication(s)/side effects and non-pharmacologic comfort measures Outcome: Progressing   Problem: Health Behavior/Discharge Planning: Goal: Ability to manage health-related needs will improve Outcome: Progressing   Problem: Clinical Measurements: Goal: Ability to maintain clinical measurements within normal limits will improve Outcome: Progressing Goal: Diagnostic test results will improve Outcome: Progressing   Problem: Nutrition: Goal: Adequate nutrition will be maintained Outcome: Progressing   Problem: Elimination: Goal: Will not experience complications related to bowel motility Outcome: Progressing   Problem: Coping: Goal: Ability to adjust to condition or change in health will improve Outcome: Progressing   Problem: Fluid Volume: Goal: Ability to maintain a balanced intake and output will improve Outcome: Progressing

## 2023-02-23 NOTE — Progress Notes (Signed)
 Mobility Specialist - Progress Note   02/23/23 1659  Mobility  Activity Ambulated with assistance in hallway;Transferred to/from [facility]  Level of Assistance Contact guard assist, steadying assist  Assistive Device Front wheel walker  Distance Ambulated (ft) 170 ft  Range of Motion/Exercises Active  Activity Response Tolerated well  Mobility Referral Yes  Mobility visit 1 Mobility  Mobility Specialist Start Time (ACUTE ONLY) 1635  Mobility Specialist Stop Time (ACUTE ONLY) 1659  Mobility Specialist Time Calculation (min) (ACUTE ONLY) 24 min   Pt was found in bed and agreeable to ambulate. Stated feeling fatigued today. At EOS returned to use [facility]. Was left sitting EOB for dinner with all needs met. Call bell in reach.  Erminio Leos Mobility Specialist

## 2023-02-23 NOTE — Progress Notes (Signed)
    8 Days Post-Op  Subjective: Tolerating liquids. Having bowel function. Denies nausea/vomiting.   Objective: Vital signs in last 24 hours: Temp:  [97.9 F (36.6 C)-98.2 F (36.8 C)] 97.9 F (36.6 C) (01/10 1957) Pulse Rate:  [66-70] 66 (01/10 1957) Resp:  [15-16] 15 (01/10 1957) BP: (110-129)/(48-66) 129/66 (01/10 1957) SpO2:  [95 %-96 %] 95 % (01/10 1957) Last BM Date : 02/21/23  Intake/Output from previous day: 01/10 0701 - 01/11 0700 In: 675.4 [I.V.:675.4] Out: -  Intake/Output this shift: No intake/output data recorded.  PE: General: resting comfortably, NAD Neuro: alert and oriented, no focal deficits Resp: normal work of breathing Abdomen: soft, nondistended, nontender to palpation. Incision clean and dry with steristrips in place. Extremities: warm and well-perfused   Lab Results:  Recent Labs    02/22/23 0308 02/23/23 0034  WBC 8.0 9.0  HGB 10.7* 11.3*  HCT 33.7* 34.0*  PLT 318 353   BMET Recent Labs    02/22/23 0308 02/23/23 0034  NA 136 137  K 4.0 4.1  CL 106 105  CO2 24 23  GLUCOSE 133* 114*  BUN 22 21  CREATININE 0.43* 0.59  CALCIUM  8.5* 8.9   PT/INR No results for input(s): LABPROT, INR in the last 72 hours. CMP     Component Value Date/Time   NA 137 02/23/2023 0034   K 4.1 02/23/2023 0034   CL 105 02/23/2023 0034   CO2 23 02/23/2023 0034   GLUCOSE 114 (H) 02/23/2023 0034   BUN 21 02/23/2023 0034   CREATININE 0.59 02/23/2023 0034   CREATININE 0.86 11/18/2018 1035   CALCIUM  8.9 02/23/2023 0034   PROT 5.9 (L) 02/21/2023 0341   PROT 6.7 12/31/2018 1045   ALBUMIN  2.9 (L) 02/21/2023 0341   AST 86 (H) 02/21/2023 0341   ALT 94 (H) 02/21/2023 0341   ALKPHOS 123 02/21/2023 0341   BILITOT 0.3 02/21/2023 0341   GFRNONAA >60 02/23/2023 0034   GFRNONAA 62 11/18/2018 1035   GFRAA 71 11/18/2018 1035   Lipase     Component Value Date/Time   LIPASE 88 (H) 02/09/2023 1135         Assessment/Plan SBO, POD8 s/p ex lap with  lysis of adhesive band.  - Advance to soft diet - Discontinue TPN after current bag runs out - Possible discharge tomorrow to SNF if tolerating soft diet. Discussed plan with patient and via phone her daughter.    LOS: 13 days    Candice Dawn, MD [facility] Surgery General, Hepatobiliary and Pancreatic Surgery 02/23/23 9:46 AM

## 2023-02-24 DIAGNOSIS — K56609 Unspecified intestinal obstruction, unspecified as to partial versus complete obstruction: Secondary | ICD-10-CM | POA: Diagnosis not present

## 2023-02-24 LAB — CBC
HCT: 34.5 % — ABNORMAL LOW (ref 36.0–46.0)
Hemoglobin: 11.1 g/dL — ABNORMAL LOW (ref 12.0–15.0)
MCH: 32.9 pg (ref 26.0–34.0)
MCHC: 32.2 g/dL (ref 30.0–36.0)
MCV: 102.4 fL — ABNORMAL HIGH (ref 80.0–100.0)
Platelets: 379 10*3/uL (ref 150–400)
RBC: 3.37 MIL/uL — ABNORMAL LOW (ref 3.87–5.11)
RDW: 12.2 % (ref 11.5–15.5)
WBC: 8.7 10*3/uL (ref 4.0–10.5)
nRBC: 0 % (ref 0.0–0.2)

## 2023-02-24 LAB — BASIC METABOLIC PANEL
Anion gap: 7 (ref 5–15)
BUN: 24 mg/dL — ABNORMAL HIGH (ref 8–23)
CO2: 25 mmol/L (ref 22–32)
Calcium: 8.8 mg/dL — ABNORMAL LOW (ref 8.9–10.3)
Chloride: 103 mmol/L (ref 98–111)
Creatinine, Ser: 0.56 mg/dL (ref 0.44–1.00)
GFR, Estimated: >60 mL/min (ref >60)
Glucose, Bld: 95 mg/dL (ref 70–99)
Potassium: 3.9 mmol/L (ref 3.5–5.1)
Sodium: 135 mmol/L (ref 135–145)

## 2023-02-24 NOTE — Progress Notes (Signed)
 PROGRESS NOTE Candice Brown  FMW:[id number] DOB: [birth year] DOA: 02/09/2023 PCP: Shepard Ade, MD  Brief Narrative/Hospital Course: 89 year old PMH significant for hyperlipidemia, GERD, presented to the ED presented to hospital with abdominal pain, nausea vomiting and constipation for the last 24 hours.  She has a past medical history of hysterectomy, appendectomy and cholecystectomy.  CT abdomen and pelvis concern for short segment abdominal small bowel right lower quadrant regional inflammatory  edematous changes in the mesentery a small volume ascites.  Consideration for localized infectious, inflammatory or ischemic enteritis.  Patient was started on IV Zosyn  and was admitted to hospital for further evaluation and treatment.  During hospitalization, patient failed to improve repeated CT abdomen and pelvis 1/1 showed finding consistent with SBO.  General surgery consulted.  NG tube placed, small bowel protocol was started.  Patient did not improve so subsequently underwent surgical intervention 02/14/2022. CCS following, has been started on TPN  1/7-1/8> patient having vomiting, underwent NG tube placement 1/8 evening> subsequently removed next day, bowel movement improved no more diarrhea, diet advanced and tolerating.  At this time pain is well-controlled.  Surgery has signed off and cleared the patient for discharge to skilled nursing facility as of 1/12.    Subjective: Alert awake oriented  tolerating diet Feels comfortable going to skilled nursing facility at [facility] if available.  Assessment and Plan: Principal Problem:   SBO (small bowel obstruction) - closed loop - s/p exlap/lysis of adhesions 02/15/2023 Active Problems:   Malignant neoplasm of upper-outer quadrant of right breast in female, estrogen receptor positive (HCC)   Enteritis   HLD (hyperlipidemia)   GERD (gastroesophageal reflux disease)   Gastroenteritis   Protein-calorie malnutrition, severe  SBO S/P ex lap with  LOA x 2 Dr. Cameron. Ileus subsequent postop: Initial CT abdomen showed enteritis managed conservatively, repeat CT scan done as patient did not improve showed SBO s/p ex lap LO. Post op complicated by nausea vomiting diarrhea >NG tube reinserted 1/8 and removed 1/10> diet advanced.  At this time tolerating diet.Seen by general surgery okay for discharge SNF.   Hyperlipidemia Resume home Crestor  on discharge   History of breast cancer Will follow-up as outpatient.   Anemia: hb overall stable    GERD: Cont ppi  Hyponatremia: Hypophosphatemia Hypokalemia: Improved.   DVT prophylaxis: enoxaparin  (LOVENOX ) injection 40 mg Start: 02/12/23 2200 SCDs Start: 02/09/23 1825 Code Status:   Code Status: Full Code Family Communication: plan of care discussed with patient at bedside.  Daughter updated at the bedside 1/10  Patient status is: Remains hospitalized because of severity of illness Level of care: Med-Surg   Dispo: The patient is from: ILF at [facility] home            Anticipated disposition: snf   at friend's home once available.  TOC informed  Objective: Vitals last 24 hrs: Vitals:   02/23/23 0437 02/23/23 1408 02/23/23 1948 02/24/23 0503  BP: (!) 130/55 (!) 115/58 (!) 111/57 (!) 128/52  Pulse: 72 72 72 69  Resp: 15 16 15 15   Temp: 98.2 F (36.8 C) 98.1 F (36.7 C) 98.4 F (36.9 C) 98.1 F (36.7 C)  TempSrc:   Oral   SpO2: 95% 97% 97% 92%  Weight:      Height:      Weight change:   Physical Examination: General exam: alert awake HEENT:Oral mucosa moist, Ear/Nose WNL grossly Respiratory system: Bilaterally clear BS,no use of accessory muscle Cardiovascular system: S1 & S2 +, No JVD. Gastrointestinal  system: Abdomen soft,NT, surgical site C/D/I  Nervous System: Alert, awake, moving all extremities,and following commands. Extremities: LE edema neg,distal peripheral pulses palpable and warm.  Skin: No rashes,no icterus. MSK: Normal muscle bulk,tone, power    Medications reviewed:  Scheduled Meds:  Chlorhexidine  Gluconate Cloth  6 each Topical Daily   enoxaparin  (LOVENOX ) injection  40 mg Subcutaneous Q24H   lidocaine   1 patch Transdermal Q24H   pantoprazole   40 mg Oral BID   pregabalin   75 mg Oral Daily  Continuous Infusions:    Diet Order             DIET SOFT Room service appropriate? Yes; Fluid consistency: Thin  Diet effective now                 No intake or output data in the 24 hours ending 02/24/23 1356 Net IO Since Admission: 9,109.93 mL [02/24/23 1356]  Wt Readings from Last 3 Encounters:  02/21/23 55.5 kg  12/27/22 53.8 kg  06/05/22 52 kg    Unresulted Labs (From admission, onward)     Start     Ordered   02/22/23 0500  Basic metabolic panel  Daily,   R     Question:  Specimen collection method  Answer:  IV Team=IV Team collect   02/21/23 1032   02/22/23 0500  CBC  Daily,   R     Question:  Specimen collection method  Answer:  IV Team=IV Team collect   02/21/23 1032          Data Reviewed: I have personally reviewed following labs and imaging studies CBC: Recent Labs  Lab 02/18/23 0419 02/21/23 0919 02/22/23 0308 02/23/23 0034 02/24/23 0324  WBC 9.0 10.2 8.0 9.0 8.7  HGB 12.1 11.5* 10.7* 11.3* 11.1*  HCT 35.3* 35.4* 33.7* 34.0* 34.5*  MCV 99.2 102.0* 103.4* 100.9* 102.4*  PLT 288 329 318 353 379   Basic Metabolic Panel:  Recent Labs  Lab 02/18/23 0419 02/19/23 0410 02/21/23 0341 02/21/23 0919 02/22/23 0308 02/23/23 0034 02/24/23 0324  NA 136   < > 135 136 136 137 135  K 3.8   < > 4.0 3.9 4.0 4.1 3.9  CL 104   < > 103 105 106 105 103  CO2 25   < > 23 24 24 23 25   GLUCOSE 126*   < > 125* 132* 133* 114* 95  BUN 15   < > 23 23 22 21  24*  CREATININE 0.50   < > 0.52 0.45 0.43* 0.59 0.56  CALCIUM  8.3*   < > 8.6* 8.5* 8.5* 8.9 8.8*  MG 2.2  --  2.1  --   --   --   --   PHOS 2.5  --  2.6  --   --   --   --    < > = values in this interval not displayed.   GFR: Estimated Creatinine  Clearance: 39.4 mL/min (by C-G formula based on SCr of 0.56 mg/dL). Liver Function Tests:  Recent Labs  Lab 02/18/23 0419 02/21/23 0341  AST 24 86*  ALT 19 94*  ALKPHOS 40 123  BILITOT 0.6 0.3  PROT 5.4* 5.9*  ALBUMIN  2.8* 2.9*   No results for input(s): CHOL, HDL, LDLCALC, TRIG, CHOLHDL, LDLDIRECT in the last 72 hours. No results for input(s): TSH, T4TOTAL, FREET4, T3FREE, THYROIDAB in the last 72 hours. Sepsis Labs: No results for input(s): PROCALCITON, LATICACIDVEN in the last 168 hours. No results found for this or any  previous visit (from the past 240 hours). Antimicrobials/Microbiology: Anti-infectives (From admission, onward)    Start     Dose/Rate Route Frequency Ordered Stop   02/16/23 0600  ceFAZolin  (ANCEF ) IVPB 2g/100 mL premix        2 g 200 mL/hr over 30 Minutes Intravenous On call to O.R. 02/15/23 1124 02/15/23 1313   02/15/23 1158  piperacillin -tazobactam (ZOSYN ) 3.375 GM/50ML IVPB  Status:  Discontinued       Note to Pharmacy: Gaines, Janel R: cabinet override      02/15/23 1158 02/15/23 1202   02/13/23 1200  piperacillin -tazobactam (ZOSYN ) IVPB 3.375 g  Status:  Discontinued        3.375 g 12.5 mL/hr over 240 Minutes Intravenous Every 8 hours 02/13/23 0918 02/15/23 0916   02/12/23 1230  amoxicillin -clavulanate (AUGMENTIN ) 875-125 MG per tablet 1 tablet  Status:  Discontinued        1 tablet Oral Every 12 hours 02/12/23 1131 02/13/23 0902   02/10/23 1400  piperacillin -tazobactam (ZOSYN ) IVPB 3.375 g  Status:  Discontinued        3.375 g 12.5 mL/hr over 240 Minutes Intravenous Every 8 hours 02/10/23 1205 02/12/23 1131   02/10/23 1000  amoxicillin -clavulanate (AUGMENTIN ) 875-125 MG per tablet 1 tablet  Status:  Discontinued        1 tablet Oral Every 12 hours 02/10/23 0809 02/10/23 1205   02/09/23 1700  piperacillin -tazobactam (ZOSYN ) IVPB 3.375 g        3.375 g 12.5 mL/hr over 240 Minutes Intravenous  Once 02/09/23 1652 02/09/23 2145          Component Value Date/Time   SDES  02/09/2023 1745    BLOOD LEFT ANTECUBITAL Performed at [facility], [address] [place], [place] [address]    SPECREQUEST  02/09/2023 1745    BOTTLES DRAWN AEROBIC AND ANAEROBIC Blood Culture results may not be optimal due to an inadequate volume of blood received in culture bottles Performed at [facility], [address] [place], [place] [address]    CULT  02/09/2023 1745    NO GROWTH 5 DAYS Performed at [facility], [address], [place], [place] [address]    REPTSTATUS 02/14/2023 FINAL 02/09/2023 1745     Radiology Studies: No results found.   LOS: 14 days   Total time spent in review of labs and imaging, patient evaluation, formulation of plan, documentation and communication with family: 35 minutes  Mennie LAMY, MD [facility]  02/24/2023, 1:56 PM

## 2023-02-24 NOTE — Plan of Care (Signed)

## 2023-02-24 NOTE — Progress Notes (Signed)
    9 Days Post-Op  Subjective: Tolerating soft diet. Feels well, having bowel function.   Objective: Vital signs in last 24 hours: Temp:  [98.1 F (36.7 C)-98.4 F (36.9 C)] 98.1 F (36.7 C) (01/12 0503) Pulse Rate:  [69-72] 69 (01/12 0503) Resp:  [15-16] 15 (01/12 0503) BP: (111-128)/(52-58) 128/52 (01/12 0503) SpO2:  [92 %-97 %] 92 % (01/12 0503) Last BM Date : 02/21/23  Intake/Output from previous day: No intake/output data recorded. Intake/Output this shift: No intake/output data recorded.  PE: General: resting comfortably, NAD Neuro: alert and oriented, no focal deficits Resp: normal work of breathing on room air Abdomen: soft, nondistended, nontender to palpation. Incision clean and dry with steristrips in place. Extremities: warm and well-perfused   Lab Results:  Recent Labs    02/23/23 0034 02/24/23 0324  WBC 9.0 8.7  HGB 11.3* 11.1*  HCT 34.0* 34.5*  PLT 353 379   BMET Recent Labs    02/23/23 0034 02/24/23 0324  NA 137 135  K 4.1 3.9  CL 105 103  CO2 23 25  GLUCOSE 114* 95  BUN 21 24*  CREATININE 0.59 0.56  CALCIUM  8.9 8.8*   PT/INR No results for input(s): LABPROT, INR in the last 72 hours. CMP     Component Value Date/Time   NA 135 02/24/2023 0324   K 3.9 02/24/2023 0324   CL 103 02/24/2023 0324   CO2 25 02/24/2023 0324   GLUCOSE 95 02/24/2023 0324   BUN 24 (H) 02/24/2023 0324   CREATININE 0.56 02/24/2023 0324   CREATININE 0.86 11/18/2018 1035   CALCIUM  8.8 (L) 02/24/2023 0324   PROT 5.9 (L) 02/21/2023 0341   PROT 6.7 12/31/2018 1045   ALBUMIN  2.9 (L) 02/21/2023 0341   AST 86 (H) 02/21/2023 0341   ALT 94 (H) 02/21/2023 0341   ALKPHOS 123 02/21/2023 0341   BILITOT 0.3 02/21/2023 0341   GFRNONAA >60 02/24/2023 0324   GFRNONAA 62 11/18/2018 1035   GFRAA 71 11/18/2018 1035   Lipase     Component Value Date/Time   LIPASE 88 (H) 02/09/2023 1135         Assessment/Plan SBO, POD9 s/p ex lap with lysis of adhesive  band.  - Continue soft, low-fiber diet - TPN off - Patient is stable for discharge from a surgical standpoint. Discharge instructions have been placed in AVS. Will arrange for postop follow up in 3-4 weeks.     LOS: 14 days    Leonor Dawn, MD [facility] Surgery General, Hepatobiliary and Pancreatic Surgery 02/24/23 8:33 AM

## 2023-02-24 NOTE — TOC Progression Note (Addendum)
 Transition of Care [facility]) - Progression Note    Patient Details  Name: Candice Brown MRN: [id number] Date of Birth: July 14, 1931  [facility] CM/SW Contact  Dalila Camellia [place], [place] Phone Number: 02/24/2023, 4:26 PM  Clinical Narrative:     CSW spoke to [facility] weekend on call person Nia to see if patient is able to discharge to SNF today.  Per Nia, they can not accept patient today, but should be able to accept tomorrow.  CSW updated attending physician, and started insurance authorization through [employer], reference number [id number].  Per insurance it is pending, TOC to follow up on Monday.       Expected Discharge Plan and Services SNF       Expected Discharge Date: 02/24/23                                     Social Determinants of Health (SDOH) Interventions SDOH Screenings   Food Insecurity: No Food Insecurity (02/16/2023)  Housing: Low Risk  (02/16/2023)  Transportation Needs: No Transportation Needs (02/16/2023)  Utilities: Not At Risk (02/16/2023)  Social Connections: Socially Integrated (02/16/2023)  Stress: No Stress Concern Present (07/21/2021)  Tobacco Use: High Risk (02/15/2023)    Readmission Risk Interventions     No data to display

## 2023-02-24 NOTE — Progress Notes (Signed)
 PICC line removed per order and with no complications.  Vaseline gauze/gauze/tegaderm applied.  Aftercare instructions reviewed with patient who verbalized understanding.

## 2023-02-24 NOTE — Discharge Summary (Deleted)
 Physician Discharge Summary  Candice Brown FMW:[id number] DOB: January 29, 1932 DOA: 02/09/2023  PCP: Shepard Ade, MD  Admit date: 02/09/2023 Discharge date: 02/24/2023 Recommendations for Outpatient Follow-up:  Follow up with PCP in 1 weeks-call for appointment Please obtain BMP/CBC in one week  Discharge Dispo: SNF Discharge Condition: Stable Code Status:   Code Status: Full Code Diet recommendation:  Diet Order             DIET SOFT Room service appropriate? Yes; Fluid consistency: Thin  Diet effective now                    Brief/Interim Summary: 89 year old PMH significant for hyperlipidemia, GERD, presented to the ED presented to hospital with abdominal pain, nausea vomiting and constipation for the last 24 hours.  She has a past medical history of hysterectomy, appendectomy and cholecystectomy.  CT abdomen and pelvis concern for short segment abdominal small bowel right lower quadrant regional inflammatory  edematous changes in the mesentery a small volume ascites.  Consideration for localized infectious, inflammatory or ischemic enteritis.  Patient was started on IV Zosyn  and was admitted to hospital for further evaluation and treatment.  During hospitalization, patient failed to improve repeated CT abdomen and pelvis 1/1 showed finding consistent with SBO.  General surgery consulted.  NG tube placed, small bowel protocol was started.  Patient did not improve so subsequently underwent surgical intervention 02/14/2022. [facility] following, has been started on TPN  1/7-1/8> patient having vomiting, underwent NG tube placement 1/8 evening> subsequently removed next day, bowel movement improved no more diarrhea, diet advanced and tolerating.  At this time pain is well-controlled.  Surgery has signed off and cleared the patient for discharge to skilled nursing facility as of 1/12.   Discharge Diagnoses:  Principal Problem:   SBO (small bowel obstruction) - closed loop - s/p exlap/lysis of  adhesions 02/15/2023 Active Problems:   Malignant neoplasm of upper-outer quadrant of right breast in female, estrogen receptor positive (HCC)   Enteritis   HLD (hyperlipidemia)   GERD (gastroesophageal reflux disease)   Gastroenteritis   Protein-calorie malnutrition, severe  SBO S/P ex lap with LOA x 2 Dr. Cameron. Ileus subsequent postop: Initial CT abdomen showed enteritis managed conservatively, repeat CT scan done as patient did not improve showed SBO s/p ex lap LO. Post op having ileus nausea vomiting diarrhea>NG tube reinserted 1/8 and removed 1/10 am. On diet tolerating well, okay for discharge per [facility] continue wound care follow-up with [facility] as outpatient.     Hyperlipidemia Resume home Crestor    History of breast cancer Will follow-up as outpatient.  Anemia: hb overall stable   GERD: Cont ppi   Hyponatremia: Hypophosphatemia Hypokalemia: Electrolytes stable w/ normal potassium and sodium. Off TPN  Consults: [facility] Subjective: Alert awake oriented  tolerating diet Feels comfortable going to skilled nursing facility at [facility] if available  Discharge Exam: Vitals:   02/23/23 1948 02/24/23 0503  BP: (!) 111/57 (!) 128/52  Pulse: 72 69  Resp: 15 15  Temp: 98.4 F (36.9 C) 98.1 F (36.7 C)  SpO2: 97% 92%   General: Pt is alert, awake, not in acute distress Cardiovascular: RRR, S1/S2 +, no rubs, no gallops Respiratory: CTA bilaterally, no wheezing, no rhonchi Abdominal: Soft, NT, ND, bowel sounds + surgical site clean dry intact Extremities: no edema, no cyanosis  Discharge Instructions  Discharge Instructions     Discharge instructions   Complete by: As directed    Follow-up with surgery for  postop care  Please call call MD or return to ER for similar or worsening recurring problem that brought you to hospital or if any fever,nausea/vomiting,abdominal pain, uncontrolled pain, chest pain,  shortness of breath or any other alarming symptoms.  Please  follow-up your doctor as instructed in a week time and call the office for appointment.  Please avoid alcohol , smoking, or any other illicit substance and maintain healthy habits including taking your regular medications as prescribed.  You were cared for by a hospitalist during your hospital stay. If you have any questions about your discharge medications or the care you received while you were in the hospital after you are discharged, you can call the unit and ask to speak with the hospitalist on call if the hospitalist that took care of you is not available.  Once you are discharged, your primary care physician will handle any further medical issues. Please note that NO REFILLS for any discharge medications will be authorized once you are discharged, as it is imperative that you return to your primary care physician (or establish a relationship with a primary care physician if you do not have one) for your aftercare needs so that they can reassess your need for medications and monitor your lab values   Increase activity slowly   Complete by: As directed       Allergies as of 02/24/2023       Reactions   Linzess [linaclotide] Diarrhea   Increased leg cramps   Oxycodone Nausea Only    ( made very sick)   Simvastatin Other (See Comments)   Leg cramps   Benzalkonium Chloride Rash   In neosporin   Neosporin [neomycin-polymyxin-gramicidin] Rash   Tape Rash   Adhesive patches of any kind        Medication List     TAKE these medications    ALIGN PO Take 1 tablet by mouth daily.   anastrozole  1 MG tablet Commonly known as: ARIMIDEX  Take 1 tablet (1 mg total) by mouth daily.   Aspirin-Caffeine 500-32.5 MG Tabs Take 1 tablet by mouth as needed (pain).   CITRUCEL PO Take 1 tablet by mouth daily.   diphenhydramine-acetaminophen  25-500 MG Tabs tablet Commonly known as: TYLENOL  PM Take 2 tablets by mouth at bedtime as needed (Sleep).   Multi Vitamin Tabs Take 1 tablet by  mouth daily. Senior woman   nicotine polacrilex 4 MG gum Commonly known as: NICORETTE Take 4 mg by mouth as needed for smoking cessation.   NON FORMULARY Take 2 tablets by mouth at bedtime. [place] leg cramp pills   pantoprazole  40 MG tablet Commonly known as: PROTONIX  Take 40 mg by mouth daily.   polycarbophil 625 MG tablet Commonly known as: FIBERCON Take 1,250 mg by mouth at bedtime.   pregabalin  150 MG capsule Commonly known as: LYRICA  Take 150 mg by mouth daily.   REFRESH OP Place 1 drop into both eyes daily as needed (Dry eye).   rosuvastatin  10 MG tablet Commonly known as: CRESTOR  Take 10 mg by mouth 2 (two) times a week.        Follow-up Information     Shepard Ade, MD Follow up in 1 week(s).   Specialty: Internal Medicine Contact information: [address] [place] [address] [phone]                Allergies  Allergen Reactions   Linzess [Linaclotide] Diarrhea    Increased leg cramps   Oxycodone Nausea Only     (  made very sick)   Simvastatin Other (See Comments)    Leg cramps   Benzalkonium Chloride Rash    In neosporin   Neosporin [Neomycin-Polymyxin-Gramicidin] Rash   Tape Rash    Adhesive patches of any kind    The results of significant diagnostics from this hospitalization (including imaging, microbiology, ancillary and laboratory) are listed below for reference.    Microbiology: No results found for this or any previous visit (from the past 240 hours).  Procedures/Studies: DG Abd Portable 1V Result Date: 02/21/2023 CLINICAL DATA:  Abdominal pain.  Ileus. EXAM: PORTABLE ABDOMEN - 1 VIEW COMPARISON:  Abdominal x-ray from yesterday. FINDINGS: Unchanged enteric tube within the stomach. Normal bowel gas pattern. No acute osseous abnormality. Prior cholecystectomy. IMPRESSION: 1. No acute findings. Electronically Signed   By: Elsie ONEIDA Shoulder M.D.   On: 02/21/2023 10:39   DG Abd Portable 1V Result Date: 02/20/2023 CLINICAL  DATA:  NG tube placement EXAM: PORTABLE ABDOMEN - 1 VIEW COMPARISON:  02/16/2023 FINDINGS: NG tube is in the fundus of the stomach. Visualized lungs clear. Aortic atherosclerosis. Prior cholecystectomy. IMPRESSION: NG tube in the stomach. Electronically Signed   By: Franky Crease M.D.   On: 02/20/2023 21:27   DG Abd Portable 1V Result Date: 02/16/2023 CLINICAL DATA:  Follow-up small bowel obstruction. EXAM: PORTABLE ABDOMEN - 1 VIEW COMPARISON:  02/16/2023 FINDINGS: Enteric tube tip and side port are below the GE junction. Surgical clips noted in the right upper quadrant. Dilated small bowel loops within the central abdomen measure up to 3.4 cm on the current exam. Previously these measured up to 4.8 cm. Enteric contrast material is identified within the proximal ascending colon and descending colon. IMPRESSION: Improving small bowel obstruction. Enteric contrast material is identified within the proximal ascending colon and descending colon. Electronically Signed   By: Waddell Calk M.D.   On: 02/16/2023 10:45   US  EKG SITE RITE Result Date: 02/16/2023 If Site Rite image not attached, placement could not be confirmed due to current cardiac rhythm.  DG Chest Port 1 View Result Date: 02/16/2023 CLINICAL DATA:  89 year old female status post nasogastric tube placement. EXAM: PORTABLE CHEST 1 VIEW COMPARISON:  Chest x-ray 01/19/2016. FINDINGS: Nasogastric tube extends into the proximal stomach with side port just distal to the gastroesophageal junction. Lung volumes are low. No consolidative airspace disease. No pleural effusions. No pneumothorax. No pulmonary nodule or mass noted. Pulmonary vasculature and the cardiomediastinal silhouette are within normal limits. Atherosclerosis in the thoracic aorta. Surgical clips project over the right upper quadrant of the abdomen, likely from prior cholecystectomy. Gaseous distention is noted in the upper abdomen, including what appear to be dilated loops of small bowel,  similar to prior studies. IMPRESSION: 1. Support apparatus, as above. 2. Low lung volumes without radiographic evidence of acute cardiopulmonary disease. 3. Aortic atherosclerosis. 4. Dilated loops of small bowel in the upper abdomen suggestive of underlying small bowel obstruction, similar to prior abdominal radiographs. Electronically Signed   By: Toribio Aye M.D.   On: 02/16/2023 05:43   DG Abd Portable 1V Result Date: 02/15/2023 CLINICAL DATA:  Small bowel obstruction EXAM: PORTABLE ABDOMEN - 1 VIEW COMPARISON:  Yesterday FINDINGS: Enteric tube tip and side port at the stomach. Continued dilatation of small bowel with gas filling. Some contrast has reached the proximal colon. IMPRESSION: Some contrast has reached the colon but there is ongoing small bowel obstruction with unchanged diffuse gaseous distension of small bowel. Electronically Signed   By: Dorn Adele HERO.D.  On: 02/15/2023 08:04   DG Abd Portable 1V Result Date: 02/14/2023 CLINICAL DATA:  Small-bowel obstruction EXAM: PORTABLE ABDOMEN - 1 VIEW COMPARISON:  02/14/2023 FINDINGS: Supine frontal view of the abdomen and pelvis excludes the right hemidiaphragm by collimation. Enteric catheter tip within the gastric fundus. Oral contrast remains within the stomach. Minimal dilute contrast is seen within dilated loops of small bowel, consistent with small-bowel obstruction. There is no oral contrast seen within the expected region of the colon. IMPRESSION: 1. Minimal progression of oral contrast, with dilute contrast seen within dilated loops of small bowel throughout the abdomen, consistent with small-bowel obstruction. No transit of contrast into the colon by the time of imaging. Electronically Signed   By: Ozell Daring M.D.   On: 02/14/2023 21:51   DG Abd Portable 1V-Small Bowel Obstruction Protocol-initial, 8 hr delay Result Date: 02/14/2023 CLINICAL DATA:  Follow-up exam for small bowel obstruction. EXAM: PORTABLE ABDOMEN - 1 VIEW  COMPARISON:  02/13/2023 FINDINGS: Enteric tube tip is scratch set enteric tube is again noted with tip and side port in the stomach. Enteric contrast material opacifies the gastric fundus. Only a small amount of dilute enteric contrast material is noted in the expected location of the proximal duodenum. No significant antegrade progression of the contrast material into the small bowel noted. Persistent dilated small bowel loops are identified measuring up to 3.7 cm. This is unchanged compared with the previous exam. IMPRESSION: 1. Persistent dilated small bowel loops compatible with small bowel obstruction. 2. Enteric contrast is identified within the gastric fundus. Just a small amount of dilute contrast is noted in the expected location of the proximal duodenum. Electronically Signed   By: Waddell Calk M.D.   On: 02/14/2023 11:25   DG Abd Portable 1V-Small Bowel Protocol-Position Verification Result Date: 02/13/2023 CLINICAL DATA:  Check gastric catheter placement EXAM: PORTABLE ABDOMEN - 1 VIEW COMPARISON:  None Available. FINDINGS: Gastric catheter is noted within the stomach. Contrast is noted from recent CT examination. Small-bowel dilatation is seen similar to that noted on recent CT examination. No free air is noted. IMPRESSION: Gastric catheter within the stomach. Persistent small bowel dilatation. Electronically Signed   By: Oneil Devonshire M.D.   On: 02/13/2023 20:24   CT Angio Abd/Pel w/ and/or w/o Result Date: 02/13/2023 CLINICAL DATA:  Mesenteric ischemia. EXAM: CTA ABDOMEN AND PELVIS WITHOUT AND WITH CONTRAST TECHNIQUE: Multidetector CT imaging of the abdomen and pelvis was performed using the standard protocol during bolus administration of intravenous contrast. Multiplanar reconstructed images and MIPs were obtained and reviewed to evaluate the vascular anatomy. RADIATION DOSE REDUCTION: This exam was performed according to the departmental dose-optimization program which includes automated  exposure control, adjustment of the mA and/or kV according to patient size and/or use of iterative reconstruction technique. CONTRAST:  OMNIPAQUE  IOHEXOL  350 MG/ML SOLN COMPARISON:  Abdomen and pelvis CT 02/09/2023 FINDINGS: VASCULAR Aorta: Normal caliber aorta without aneurysm, dissection, vasculitis or significant stenosis. Moderate atherosclerotic disease. Celiac: Patent without evidence of aneurysm, dissection, vasculitis or significant stenosis. SMA: Patent without evidence of aneurysm, dissection, vasculitis or significant stenosis. Renals: Both renal arteries are patent without evidence of aneurysm, dissection, vasculitis, fibromuscular dysplasia or significant stenosis. IMA: Patent without evidence of aneurysm, dissection, vasculitis or significant stenosis. Inflow: Patent without evidence of aneurysm, dissection, vasculitis or significant stenosis. Proximal Outflow: Bilateral common femoral and visualized portions of the superficial and profunda femoral arteries are patent without evidence of aneurysm, dissection, vasculitis or significant stenosis. Veins: Portal vein and superior  mesenteric vein are patent. No discernible venous thrombosis within the small bowel mesentery Review of the MIP images confirms the above findings. NON-VASCULAR Lower chest: Tiny bilateral pleural effusions with dependent atelectasis. Hepatobiliary: No suspicious focal abnormality within the liver parenchyma. Small area of low attenuation in the anterior liver, adjacent to the falciform ligament, is in a characteristic location for focal fatty deposition. Gallbladder is surgically absent. No intrahepatic or extrahepatic biliary dilation. Pancreas: No focal mass lesion. No dilatation of the main duct. No intraparenchymal cyst. No peripancreatic edema. Spleen: No splenomegaly. No suspicious focal mass lesion. Adrenals/Urinary Tract: No adrenal nodule or mass. Tiny well-defined homogeneous low-density lesions in both kidneys  are too small to characterize but are statistically most likely benign and probably cysts. No followup imaging is recommended. No evidence for hydroureter. The urinary bladder appears normal for the degree of distention. Stomach/Bowel: Stomach is unremarkable. No gastric wall thickening. No evidence of outlet obstruction. Duodenum is normally positioned as is the ligament of Treitz. 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 On today's exam, there is a fluid collection in the region of the diverticulum with multiple tiny gas bubbles associated. Coronal image 72/14 raises the question of a contained fluid collection, but there is no duodenal wall thickening in these changes are probably all related to the patient's known diverticulum. Small bowel loops have become more diffusely distended in the interval measuring up to 3.2 cm diameter. No discernible small bowel wall thickening and no areas of small-bowel mural non-enhancement are evident. There is no small bowel pneumatosis. Patient is noted to have a completely decompressed distal and terminal ileum with an apparent abrupt transition zone in the right pelvis (see axial 136/5 and coronal 48/14). The appendix is not visible compatible with reported history of appendectomy. No gross colonic mass. No colonic wall thickening. Diverticuli are seen scattered along the entire length of the colon without CT findings of diverticulitis. Lymphatic: There is no gastrohepatic or hepatoduodenal ligament lymphadenopathy. No retroperitoneal or mesenteric lymphadenopathy. No pelvic sidewall lymphadenopathy. Reproductive: Hysterectomy.  There is no adnexal mass. Other: Increasing free fluid is seen in the abdomen and pelvis with mesenteric edema/congestion also progressive since prior. Musculoskeletal: Tiny umbilical hernia contains fat and trace fluid. Gas bubbles in the subcutaneous fat of the right lower anterior abdominal wall likely related to an  injection site. No worrisome lytic or sclerotic osseous abnormality. IMPRESSION: 1. No definite CT evidence for mesenteric ischemia. Mesenteric arterial anatomy is patent. No evidence for filling defect within the central mesenteric venous anatomy. No small bowel wall thickening. No small bowel pneumatosis. No areas of small-bowel wall non-enhancement are identified. No intraperitoneal free gas. 2. Small bowel loops have become more diffusely distended in the interval measuring up to 3.2 cm diameter. Patient is noted to have a completely decompressed distal and terminal ileum with an apparent abrupt transition zone in the right pelvis. This abrupt transition zone corresponds to the abnormality identified on the CT scan from March 02, 2023, but appears more pronounced on today's exam likely due to the increased bowel distension. Imaging features raise concern for small-bowel obstruction, potentially secondary to an adhesion. Internal hernia/closed loop obstruction is not excluded. 3. Increasing free fluid in the abdomen and pelvis with mesenteric edema/congestion also progressive since prior. 4. 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 On today's exam, there is a fluid collection in the region of the diverticulum with multiple tiny gas bubbles associated. The anatomy of the diverticulum is not well demonstrated  on today's study and these findings are likely related to the duodenal diverticulum. Contain retroperitoneal duodenal perforation is considered less likely but not entirely excluded. 5. Tiny bilateral pleural effusions with dependent atelectasis. 6.  Aortic Atherosclerosis (ICD10-I70.0). I discussed these findings by telephone with Dr. Regalado at approximately 1425 hours on 02/13/2023. Electronically Signed   By: Camellia Candle M.D.   On: 02/13/2023 14:25   CT ABDOMEN PELVIS W CONTRAST Result Date: 02/09/2023 CLINICAL DATA:  Acute abdominal pain, nonlocalized EXAM: CT ABDOMEN AND  PELVIS WITH CONTRAST TECHNIQUE: Multidetector CT imaging of the abdomen and pelvis was performed using the standard protocol following bolus administration of intravenous contrast. RADIATION DOSE REDUCTION: This exam was performed according to the departmental dose-optimization program which includes automated exposure control, adjustment of the mA and/or kV according to patient size and/or use of iterative reconstruction technique. CONTRAST:  OMNIPAQUE  IOHEXOL  300 MG/ML  SOLN COMPARISON:  CT 02/10/2020 FINDINGS: Lower chest: No pleural or pericardial effusion. Scattered coronary and aortic calcifications. Coarse subpleural linear scarring or atelectasis in cystic change posteriorly in the lung bases. Hepatobiliary: No focal liver abnormality is seen. Status post cholecystectomy. No biliary dilatation. Pancreas: Unremarkable. No pancreatic ductal dilatation or surrounding inflammatory changes. Spleen: Normal in size without focal abnormality. Adrenals/Urinary Tract: No adrenal mass. Symmetric renal enhancement without urolithiasis or hydronephrosis. Multiple cortical lesions in both kidneys, some of which can be characterized as cysts, largest 1.1 cm 21 HU left upper pole; no followup recommended. Urinary bladder is distended. Stomach/Bowel: Stomach is partially distended, without acute finding. Fluid distended small bowel loops in the left lower abdomen. Transition zone in the right lower quadrant with a short loop of small bowel showing some circumferential wall thickening, and relatively decreased mucosal enhancement, with regional inflammatory/edematous changes in the mesentery (Im26-29,[id number]) . post appendectomy. The colon is partially distended, with multiple descending and sigmoid diverticula; no adjacent inflammatory change. Vascular/Lymphatic: Heavy calcified aortoiliac atheromatous plaque without aneurysm. Portal vein patent. No abdominal or pelvic adenopathy. Reproductive: Status post hysterectomy.  No adnexal masses. Other: Small amount of free fluid in the pelvis. Small volume right lower quadrant ascites adjacent to the abnormal loop of bowel as above. No upper abdominal ascites. No free air. Musculoskeletal: Thoracolumbar lower levoscoliosis apex L2 with multilevel lumbar spondylitic change. Grade 1 anterolisthesis L5-S1 attributed to advanced facet DJD. IMPRESSION: 1. Short segment of abnormal small bowel in the right lower quadrant with regional inflammatory/edematous changes in the mesentery and small volume ascites. Appearance is nonspecific, considerations include localized infectious, inflammatory, or ischemic enteritis. 2. Descending and sigmoid diverticulosis. 3.  Aortic Atherosclerosis (ICD10-I70.0). Electronically Signed   By: JONETTA Faes M.D.   On: 02/09/2023 16:24    Labs: BNP (last 3 results) No results for input(s): BNP in the last 8760 hours. Basic Metabolic Panel: Recent Labs  Lab 02/18/23 0419 02/19/23 0410 02/21/23 0341 02/21/23 0919 02/22/23 0308 02/23/23 0034 02/24/23 0324  NA 136   < > 135 136 136 137 135  K 3.8   < > 4.0 3.9 4.0 4.1 3.9  CL 104   < > 103 105 106 105 103  CO2 25   < > 23 24 24 23 25   GLUCOSE 126*   < > 125* 132* 133* 114* 95  BUN 15   < > 23 23 22 21  24*  CREATININE 0.50   < > 0.52 0.45 0.43* 0.59 0.56  CALCIUM  8.3*   < > 8.6* 8.5* 8.5* 8.9 8.8*  MG 2.2  --  2.1  --   --   --   --   PHOS 2.5  --  2.6  --   --   --   --    < > = values in this interval not displayed.   Liver Function Tests: Recent Labs  Lab 02/18/23 0419 02/21/23 0341  AST 24 86*  ALT 19 94*  ALKPHOS 40 123  BILITOT 0.6 0.3  PROT 5.4* 5.9*  ALBUMIN  2.8* 2.9*   No results for input(s): LIPASE, AMYLASE in the last 168 hours. No results for input(s): AMMONIA in the last 168 hours. CBC: Recent Labs  Lab 02/18/23 0419 02/21/23 0919 02/22/23 0308 02/23/23 0034 02/24/23 0324  WBC 9.0 10.2 8.0 9.0 8.7  HGB 12.1 11.5* 10.7* 11.3* 11.1*  HCT 35.3* 35.4*  33.7* 34.0* 34.5*  MCV 99.2 102.0* 103.4* 100.9* 102.4*  PLT 288 329 318 353 379   Cardiac Enzymes: No results for input(s): CKTOTAL, CKMB, CKMBINDEX, TROPONINI in the last 168 hours. BNP: Invalid input(s): POCBNP CBG: Recent Labs  Lab 02/20/23 1544 02/20/23 2056 02/21/23 0519 02/21/23 0549 02/21/23 0716  GLUCAP 143* 132* 127* 103* 113*  Anemia work up No results for input(s): VITAMINB12, FOLATE, FERRITIN, TIBC, IRON, RETICCTPCT in the last 72 hours. Urinalysis    Component Value Date/Time   COLORURINE STRAW (A) 02/09/2023 1538   APPEARANCEUR CLEAR 02/09/2023 1538   LABSPEC 1.029 02/09/2023 1538   PHURINE 6.0 02/09/2023 1538   GLUCOSEU NEGATIVE 02/09/2023 1538   HGBUR NEGATIVE 02/09/2023 1538   BILIRUBINUR NEGATIVE 02/09/2023 1538   KETONESUR 5 (A) 02/09/2023 1538   PROTEINUR NEGATIVE 02/09/2023 1538   NITRITE NEGATIVE 02/09/2023 1538   LEUKOCYTESUR NEGATIVE 02/09/2023 1538   Sepsis Labs Recent Labs  Lab 02/21/23 0919 02/22/23 0308 02/23/23 0034 02/24/23 0324  WBC 10.2 8.0 9.0 8.7   Microbiology No results found for this or any previous visit (from the past 240 hours).   Time coordinating discharge: 35 minutes  SIGNED: Mennie LAMY, MD  [facility] 02/24/2023, 9:57 AM  If 7PM-7AM, please contact night-coverage [email or web address]

## 2023-02-24 NOTE — Discharge Instructions (Signed)
 [facility] DISCHARGE INSTRUCTIONS  Activity No heavy lifting greater than 15 pounds for 8 weeks after surgery. Ok to shower, but do not bathe or submerge incisions underwater. Do not drive while taking narcotic pain medication. You may drive when you are no longer taking prescription pain medication, you can comfortably wear a seatbelt, and you can safely maneuver your car and apply brakes.  Wound Care Your incision is covered with strips (Steristrips). These will fall off on their own after about 2 weeks. You may shower and allow warm soapy water to run over your incisions. Gently pat dry. Do not submerge your incision underwater until cleared by your surgeon. Monitor your incision for any new redness, tenderness, or drainage. Many patients will experience some swelling and bruising at the incisions.  Ice packs will help.  Swelling and bruising can take several days to resolve.   Medications A  prescription for pain medication may be given to you upon discharge.  Take your pain medication as prescribed, if needed.  If narcotic pain medicine is not needed, then you may take acetaminophen  (Tylenol ) or ibuprofen (Advil) as needed. It is common to experience some constipation if taking pain medication after surgery.  Increasing fluid intake and taking a stool softener (such as Colace) will usually help or prevent this problem from occurring.  A mild laxative (Milk of Magnesia or Miralax ) should be taken according to package directions if there are no bowel movements after 48 hours. Take your usually prescribed medications unless otherwise directed. If you need a refill on your pain medication, please contact your pharmacy.  They will contact our office to request authorization. Prescriptions will not be filled after 5 pm or on weekends.  When to Call Us : Fever greater than 100.5 New redness, drainage, or swelling at incision site Severe pain, nausea, or vomiting Persistent  bleeding from incisions  Follow-up You will have an appointment scheduled with Dr. Signe in 3-4 weeks - our office will contact you to set this up. This will be at the [facility] office at [address], [address], [place], [place]. Please arrive at least 15 minutes prior to your scheduled appointment time.  IF YOU HAVE DISABILITY OR FAMILY LEAVE FORMS, YOU MUST BRING THEM TO THE OFFICE FOR PROCESSING.   DO NOT GIVE THEM TO YOUR DOCTOR.  The clinic staff is available to answer your questions during regular business hours.  Please don't hesitate to call and ask to speak to one of the nurses for clinical concerns.  If you have a medical emergency, go to the nearest emergency room or call 911.  A surgeon from [facility] is always on call at the hospital  [address], [address], [place], [place]  [address] ?  [address] [address], [place], [place]   [address] [phone] ? Toll Free: [phone] ? FAX [phone] Web site: [email or web address]      Managing Your Pain After Surgery Without Opioids    Thank you for participating in our program to help patients manage their pain after surgery without opioids. This is part of our effort to provide you with the best care possible, without exposing you or your family to the risk that opioids pose.  What pain can I expect after surgery? You can expect to have some pain after surgery. This is normal. The pain is typically worse the day after surgery, and quickly begins to get better. Many studies have found that many patients are  able to manage their pain after surgery with Over-the-Counter (OTC) medications such as Tylenol  and Motrin. If you have a condition that does not allow you to take Tylenol  or Motrin, notify your surgical team.  How will I manage my pain? The best strategy for controlling your pain after surgery is around the clock pain control with Tylenol  (acetaminophen ) and Motrin (ibuprofen  or Advil). Alternating these medications with each other allows you to maximize your pain control. In addition to Tylenol  and Motrin, you can use heating pads or ice packs on your incisions to help reduce your pain.  How will I alternate your regular strength over-the-counter pain medication? You will take a dose of pain medication every three hours. Start by taking 650 mg of Tylenol  (2 pills of 325 mg) 3 hours later take 600 mg of Motrin (3 pills of 200 mg) 3 hours after taking the Motrin take 650 mg of Tylenol  3 hours after that take 600 mg of Motrin.   - 1 -  See example - if your first dose of Tylenol  is at 12:00 PM   12:00 PM Tylenol  650 mg (2 pills of 325 mg)  3:00 PM Motrin 600 mg (3 pills of 200 mg)  6:00 PM Tylenol  650 mg (2 pills of 325 mg)  9:00 PM Motrin 600 mg (3 pills of 200 mg)  Continue alternating every 3 hours   We recommend that you follow this schedule around-the-clock for at least 3 days after surgery, or until you feel that it is no longer needed. Use the table on the last page of this handout to keep track of the medications you are taking. Important: Do not take more than 3000mg  of Tylenol  or 3200mg  of Motrin in a 24-hour period. Do not take ibuprofen/Motrin if you have a history of bleeding stomach ulcers, severe kidney disease, &/or actively taking a blood thinner  What if I still have pain? If you have pain that is not controlled with the over-the-counter pain medications (Tylenol  and Motrin or Advil) you might have what we call "breakthrough" pain. You will receive a prescription for a small amount of an opioid pain medication such as Oxycodone, Tramadol, or Tylenol  with Codeine. Use these opioid pills in the first 24 hours after surgery if you have breakthrough pain. Do not take more than 1 pill every 4-6 hours.  If you still have uncontrolled pain after using all opioid pills, don't hesitate to call our staff using the number provided. We will help make sure  you are managing your pain in the best way possible, and if necessary, we can provide a prescription for additional pain medication.   Day 1    Time  Name of Medication Number of pills taken  Amount of Acetaminophen   Pain Level   Comments  AM PM       AM PM       AM PM       AM PM       AM PM       AM PM       AM PM       AM PM       Total Daily amount of Acetaminophen  Do not take more than  3,000 mg per day      Day 2    Time  Name of Medication Number of pills taken  Amount of Acetaminophen   Pain Level   Comments  AM PM       AM PM  AM PM       AM PM       AM PM       AM PM       AM PM       AM PM       Total Daily amount of Acetaminophen  Do not take more than  3,000 mg per day      Day 3    Time  Name of Medication Number of pills taken  Amount of Acetaminophen   Pain Level   Comments  AM PM       AM PM       AM PM       AM PM         AM PM       AM PM       AM PM       AM PM       Total Daily amount of Acetaminophen  Do not take more than  3,000 mg per day      Day 4    Time  Name of Medication Number of pills taken  Amount of Acetaminophen   Pain Level   Comments  AM PM       AM PM       AM PM       AM PM       AM PM       AM PM       AM PM       AM PM       Total Daily amount of Acetaminophen  Do not take more than  3,000 mg per day      Day 5    Time  Name of Medication Number of pills taken  Amount of Acetaminophen   Pain Level   Comments  AM PM       AM PM       AM PM       AM PM       AM PM       AM PM       AM PM       AM PM       Total Daily amount of Acetaminophen  Do not take more than  3,000 mg per day      Day 6    Time  Name of Medication Number of pills taken  Amount of Acetaminophen   Pain Level  Comments  AM PM       AM PM       AM PM       AM PM       AM PM       AM PM       AM PM       AM PM       Total Daily amount of Acetaminophen  Do not take more than  3,000 mg per day       Day 7    Time  Name of Medication Number of pills taken  Amount of Acetaminophen   Pain Level   Comments  AM PM       AM PM       AM PM       AM PM       AM PM       AM PM       AM PM       AM PM       Total Daily amount of Acetaminophen   Do not take more than  3,000 mg per day        For additional information about how and where to safely dispose of unused opioid medications - prankcrew.uy  Disclaimer: This document contains information and/or instructional materials adapted from [facility] for the typical patient with your condition. It does not replace medical advice from your health care provider because your experience may differ from that of the typical patient. Talk to your health care provider if you have any questions about this document, your condition or your treatment plan. Adapted from [facility]

## 2023-02-25 DIAGNOSIS — K56609 Unspecified intestinal obstruction, unspecified as to partial versus complete obstruction: Secondary | ICD-10-CM | POA: Diagnosis not present

## 2023-02-25 LAB — CBC
HCT: 36.2 % (ref 36.0–46.0)
Hemoglobin: 12 g/dL (ref 12.0–15.0)
MCH: 33.1 pg (ref 26.0–34.0)
MCHC: 33.1 g/dL (ref 30.0–36.0)
MCV: 100 fL (ref 80.0–100.0)
Platelets: 404 10*3/uL — ABNORMAL HIGH (ref 150–400)
RBC: 3.62 MIL/uL — ABNORMAL LOW (ref 3.87–5.11)
RDW: 12.1 % (ref 11.5–15.5)
WBC: 8.7 10*3/uL (ref 4.0–10.5)
nRBC: 0 % (ref 0.0–0.2)

## 2023-02-25 LAB — BASIC METABOLIC PANEL
Anion gap: 7 (ref 5–15)
BUN: 23 mg/dL (ref 8–23)
CO2: 27 mmol/L (ref 22–32)
Calcium: 9.1 mg/dL (ref 8.9–10.3)
Chloride: 103 mmol/L (ref 98–111)
Creatinine, Ser: 0.81 mg/dL (ref 0.44–1.00)
GFR, Estimated: >60 mL/min (ref >60)
Glucose, Bld: 88 mg/dL (ref 70–99)
Potassium: 4.2 mmol/L (ref 3.5–5.1)
Sodium: 137 mmol/L (ref 135–145)

## 2023-02-25 NOTE — Progress Notes (Signed)
 Physical Therapy Treatment Patient Details Name: Candice Brown MRN: [id number] DOB: 1932/02/05 Today's Date: 02/25/2023   History of Present Illness 89 year old admitted with SBO and s/p exlap/lysis of adhesions 02/15/2023.  PMH significant for hyperlipidemia, GERD, hiatal hernia, diverticulosis, occipital neuralgia, peripheral neuropathy, hysterectomy, appendectomy and cholecystectomy.    PT Comments  General Comments: AxO x 3 pleasant Lady who resides at [facility] past 2 years.  Prior to hospitalization, Pt was Indep.  Amb w/o any AD.  Shopping.  Assisted OOB required increased time and effort.  General bed mobility comments: pt required assist with upper body to get OOB and assist with B LE to get back into bed due to recent ABD surgery. General transfer comment: requires Contact Guard assist to rise with intial forward lean slight LOB.  Needing walker for stability.  Also required assist to rise from lower toilet level.  Assist for balance with tight runs and back steps was needed as well due to [employer] and fowrad flexed posture. General Gait Details: decreased amb distance this session due to increased c/o weakness/fatigue.  I haven't been eating much since surgery.  Pt also low energy.  Assisted with amb top bathroom then a limited distance in hallway.  Trial amb w/o walker resulted in VERY unsteady gait with LOB all planes and increased anxiety/fear of falling.  Rec cont use of walker.  Gait is slow.  Increased unsteadiness with turns and back steps even with walker.  HIGH FALL RISK. Assisted back to bed per pt request to rest. Pt will need ST Rehab at SNF to address mobility and functional decline prior to safely returning home at her [facility].     If plan is discharge home, recommend the following: A little help with walking and/or transfers;A little help with bathing/dressing/bathroom;Assistance with cooking/housework   Can travel by private vehicle     Yes   Equipment Recommendations  None recommended by PT    Recommendations for Other Services       Precautions / Restrictions Precautions Precautions: Fall Precaution Comments: recent ABD surgery Restrictions Weight Bearing Restrictions Per Provider Order: No     Mobility  Bed Mobility Overal bed mobility: Needs Assistance Bed Mobility: Sidelying to Sit, Sit to Sidelying   Sidelying to sit: Min assist, Used rails, HOB elevated     Sit to sidelying: Min assist, Mod assist General bed mobility comments: pt required assist with upper body to get OOB and assist with B LE to get back into bed due to recent ABD surgery.    Transfers Overall transfer level: Needs assistance Equipment used: Rolling walker (2 wheels) Transfers: Sit to/from Stand Sit to Stand: Contact guard assist           General transfer comment: requires Contact Guard assist to rise with intial forward lean slight LOB.  Needing walker for stability.  Also required assist to rise from lower toilet level.  Assist for balance with tight runs and back steps was needed as well due to [employer] and fowrad flexed posture.    Ambulation/Gait Ambulation/Gait assistance: Contact guard assist, Min assist Gait Distance (Feet): 45 Feet Assistive device: Rolling walker (2 wheels) Gait Pattern/deviations: Step-through pattern, Decreased stride length Gait velocity: decreased     General Gait Details: decreased amb distance this session due to increased c/o weakness/fatigue.  I haven't been eating much since surgery.  Pt also low energy.  Assisted with amb top bathroom then a limited distance in hallway.  Trial amb w/o walker resulted in VERY unsteady gait with LOB all planes and increased anxiety/fear of falling.  Rec cont use of walker.  Gait is slow.  Increased unsteadiness with turns and back steps even with walker.  HIGH FALL RISK.   Stairs             Wheelchair Mobility     Tilt Bed    Modified  Rankin (Stroke Patients Only)       Balance                                            Cognition Arousal: Alert Behavior During Therapy: WFL for tasks assessed/performed Overall Cognitive Status: Within Functional Limits for tasks assessed                                 General Comments: AxO x 3 pleasant Lady who resides at [facility] past 2 years.  Prior to hospitalization, Pt was Indep.  Amb w/o any AD.  Shopping.        Exercises      General Comments        Pertinent Vitals/Pain Pain Assessment Pain Assessment: Faces Faces Pain Scale: Hurts a little bit Pain Location: surgical site and L shoulder posterior blade Pain Descriptors / Indicators: Aching, Discomfort, Grimacing Pain Intervention(s): Monitored during session    Home Living                          Prior Function            PT Goals (current goals can now be found in the care plan section) Progress towards PT goals: Progressing toward goals    Frequency    Min 1X/week      PT Plan      Co-evaluation              AM-PAC PT 6 Clicks Mobility   Outcome Measure  Help needed turning from your back to your side while in a flat bed without using bedrails?: A Little Help needed moving from lying on your back to sitting on the side of a flat bed without using bedrails?: A Little Help needed moving to and from a bed to a chair (including a wheelchair)?: A Little Help needed standing up from a chair using your arms (e.g., wheelchair or bedside chair)?: A Little Help needed to walk in hospital room?: A Little Help needed climbing 3-5 steps with a railing? : A Lot 6 Click Score: 17    End of Session Equipment Utilized During Treatment: Gait belt Activity Tolerance: Patient limited by fatigue Patient left: in bed;with call bell/phone within reach;with bed alarm set Nurse Communication: Mobility status PT Visit Diagnosis:  Difficulty in walking, not elsewhere classified (R26.2)     Time: 9087-9061 PT Time Calculation (min) (ACUTE ONLY): 26 min  Charges:    $Gait Training: 8-22 mins $Therapeutic Activity: 8-22 mins PT General Charges $$ ACUTE PT VISIT: 1 Visit                     {Rylynne Schicker  PTA Acute  Rehabilitation Services Office M-F          [phone]

## 2023-02-25 NOTE — Discharge Summary (Addendum)
 Physician Discharge Summary  Candice Brown FMW:[id number] DOB: 11-15-1931 DOA: 02/09/2023  PCP: Shepard Ade, MD  Admit date: 02/09/2023 Discharge date: 02/26/2023 Recommendations for Outpatient Follow-up:  Follow up with PCP in 1 weeks-call for appointment Please obtain BMP/CBC in one week  Discharge Dispo: SNF Discharge Condition: Stable Code Status:   Code Status: Full Code Diet recommendation:  Diet Order             DIET SOFT Room service appropriate? Yes; Fluid consistency: Thin  Diet effective now                    Brief/Interim Summary: 89 year old PMH significant for hyperlipidemia, GERD, presented to the ED presented to hospital with abdominal pain, nausea vomiting and constipation for the last 24 hours.  She has a past medical history of hysterectomy, appendectomy and cholecystectomy.  CT abdomen and pelvis concern for short segment abdominal small bowel right lower quadrant regional inflammatory  edematous changes in the mesentery a small volume ascites.  Consideration for localized infectious, inflammatory or ischemic enteritis.  Patient was started on IV Zosyn  and was admitted to hospital for further evaluation and treatment.  During hospitalization, patient failed to improve repeated CT abdomen and pelvis 1/1 showed finding consistent with SBO.  General surgery consulted.  NG tube placed, small bowel protocol was started.  Patient did not improve so subsequently underwent surgical intervention 02/14/2022. [facility] following, has been started on TPN  1/7-1/8> patient having vomiting, underwent NG tube placement 1/8 evening> subsequently removed next day, bowel movement improved no more diarrhea, diet advanced and tolerating.  At this time pain is well-controlled.  Surgery has signed off and cleared the patient for discharge to skilled nursing facility as of 1/12.   Discharge Diagnoses:  Principal Problem:   SBO (small bowel obstruction) - closed loop - s/p exlap/lysis of  adhesions 88/04/2023 Active Problems:   Malignant neoplasm of upper-outer quadrant of right breast in female, estrogen receptor positive (HCC)   Enteritis   HLD (hyperlipidemia)   GERD (gastroesophageal reflux disease)   Gastroenteritis   Protein-calorie malnutrition, severe  SBO S/P ex lap with LOA x 2 Dr. Cameron. Ileus subsequent postop: Initial CT abdomen showed enteritis managed conservatively, repeat CT scan done as patient did not improve showed SBO s/p ex lap LO. Post op having ileus nausea vomiting diarrhea>NG tube reinserted 1/8 and removed 1/10 am. On diet tolerating well, okay for discharge per [facility] continue wound care follow-up with [facility] as outpatient.     Hyperlipidemia Resume home Crestor    History of breast cancer Will follow-up as outpatient.  Anemia: hb overall stable   GERD: Cont ppi   Hyponatremia: Hypophosphatemia Hypokalemia: Electrolytes stable w/ normal potassium and sodium. Off TPN  Consults: [facility] Subjective: Seen and examined resting comfortably she is waiting for return to skilled nursing facility today, cleared by surgery again this morning and discussed.  Tolerating diet   Discharge Exam: Vitals:   02/25/23 2100 02/26/23 0618  BP: 123/63 124/60  Pulse: 68 63  Resp: 18 18  Temp: 98 F (36.7 C) (!) 97.4 F (36.3 C)  SpO2: 94% 93%   General: Pt is alert, awake, not in acute distress Cardiovascular: RRR, S1/S2 +, no rubs, no gallops Respiratory: CTA bilaterally, no wheezing, no rhonchi Abdominal: Soft, NT, ND, bowel sounds + surgical site clean dry intact Extremities: no edema, no cyanosis  Discharge Instructions  Discharge Instructions     Discharge instructions   Complete by: As  directed    Follow-up with surgery for postop care  Please call call MD or return to ER for similar or worsening recurring problem that brought you to hospital or if any fever,nausea/vomiting,abdominal pain, uncontrolled pain, chest pain,  shortness of breath or  any other alarming symptoms.  Please follow-up your doctor as instructed in a week time and call the office for appointment.  Please avoid alcohol , smoking, or any other illicit substance and maintain healthy habits including taking your regular medications as prescribed.  You were cared for by a hospitalist during your hospital stay. If you have any questions about your discharge medications or the care you received while you were in the hospital after you are discharged, you can call the unit and ask to speak with the hospitalist on call if the hospitalist that took care of you is not available.  Once you are discharged, your primary care physician will handle any further medical issues. Please note that NO REFILLS for any discharge medications will be authorized once you are discharged, as it is imperative that you return to your primary care physician (or establish a relationship with a primary care physician if you do not have one) for your aftercare needs so that they can reassess your need for medications and monitor your lab values   Increase activity slowly   Complete by: As directed       Allergies as of 02/26/2023       Reactions   Linzess [linaclotide] Diarrhea   Increased leg cramps   Oxycodone Nausea Only    ( made very sick)   Simvastatin Other (See Comments)   Leg cramps   Benzalkonium Chloride Rash   In neosporin   Neosporin [neomycin-polymyxin-gramicidin] Rash   Tape Rash   Adhesive patches of any kind        Medication List     TAKE these medications    ALIGN PO Take 1 tablet by mouth daily.   anastrozole  1 MG tablet Commonly known as: ARIMIDEX  Take 1 tablet (1 mg total) by mouth daily.   Aspirin-Caffeine 500-32.5 MG Tabs Take 1 tablet by mouth as needed (pain).   CITRUCEL PO Take 1 tablet by mouth daily.   diphenhydramine-acetaminophen  25-500 MG Tabs tablet Commonly known as: TYLENOL  PM Take 2 tablets by mouth at bedtime as needed (Sleep).    Multi Vitamin Tabs Take 1 tablet by mouth daily. Senior woman   nicotine polacrilex 4 MG gum Commonly known as: NICORETTE Take 4 mg by mouth as needed for smoking cessation.   NON FORMULARY Take 2 tablets by mouth at bedtime. [place] leg cramp pills   pantoprazole  40 MG tablet Commonly known as: PROTONIX  Take 40 mg by mouth daily.   polycarbophil 625 MG tablet Commonly known as: FIBERCON Take 1,250 mg by mouth at bedtime.   pregabalin  150 MG capsule Commonly known as: LYRICA  Take 150 mg by mouth daily.   REFRESH OP Place 1 drop into both eyes daily as needed (Dry eye).   rosuvastatin  10 MG tablet Commonly known as: CRESTOR  Take 10 mg by mouth 2 (two) times a week.        Follow-up Information     Shepard Ade, MD Follow up in 1 week(s).   Specialty: Internal Medicine Contact information: [address] [place] [address] [phone]         Signe Mitzie LABOR, MD Follow up on 03/21/2023.   Specialty: General Surgery Why: follow up appt 2/6 at 9 am Contact  information: [address] [place] [address] [phone]                Allergies  Allergen Reactions   Linzess [Linaclotide] Diarrhea    Increased leg cramps   Oxycodone Nausea Only     ( made very sick)   Simvastatin Other (See Comments)    Leg cramps   Benzalkonium Chloride Rash    In neosporin   Neosporin [Neomycin-Polymyxin-Gramicidin] Rash   Tape Rash    Adhesive patches of any kind    The results of significant diagnostics from this hospitalization (including imaging, microbiology, ancillary and laboratory) are listed below for reference.    Microbiology: No results found for this or any previous visit (from the past 240 hours).  Procedures/Studies: DG Abd Portable 1V Result Date: 02/21/2023 CLINICAL DATA:  Abdominal pain.  Ileus. EXAM: PORTABLE ABDOMEN - 1 VIEW COMPARISON:  Abdominal x-ray from yesterday. FINDINGS: Unchanged enteric tube within the  stomach. Normal bowel gas pattern. No acute osseous abnormality. Prior cholecystectomy. IMPRESSION: 1. No acute findings. Electronically Signed   By: Elsie ONEIDA Shoulder M.D.   On: 02/21/2023 10:39   DG Abd Portable 1V Result Date: 02/20/2023 CLINICAL DATA:  NG tube placement EXAM: PORTABLE ABDOMEN - 1 VIEW COMPARISON:  02/16/2023 FINDINGS: NG tube is in the fundus of the stomach. Visualized lungs clear. Aortic atherosclerosis. Prior cholecystectomy. IMPRESSION: NG tube in the stomach. Electronically Signed   By: Franky Crease M.D.   On: 02/20/2023 21:27   DG Abd Portable 1V Result Date: 02/16/2023 CLINICAL DATA:  Follow-up small bowel obstruction. EXAM: PORTABLE ABDOMEN - 1 VIEW COMPARISON:  02/16/2023 FINDINGS: Enteric tube tip and side port are below the GE junction. Surgical clips noted in the right upper quadrant. Dilated small bowel loops within the central abdomen measure up to 3.4 cm on the current exam. Previously these measured up to 4.8 cm. Enteric contrast material is identified within the proximal ascending colon and descending colon. IMPRESSION: Improving small bowel obstruction. Enteric contrast material is identified within the proximal ascending colon and descending colon. Electronically Signed   By: Waddell Calk M.D.   On: 02/16/2023 10:45   US  EKG SITE RITE Result Date: 02/16/2023 If Site Rite image not attached, placement could not be confirmed due to current cardiac rhythm.  DG Chest Port 1 View Result Date: 02/16/2023 CLINICAL DATA:  89 year old female status post nasogastric tube placement. EXAM: PORTABLE CHEST 1 VIEW COMPARISON:  Chest x-ray 01/19/2016. FINDINGS: Nasogastric tube extends into the proximal stomach with side port just distal to the gastroesophageal junction. Lung volumes are low. No consolidative airspace disease. No pleural effusions. No pneumothorax. No pulmonary nodule or mass noted. Pulmonary vasculature and the cardiomediastinal silhouette are within normal limits.  Atherosclerosis in the thoracic aorta. Surgical clips project over the right upper quadrant of the abdomen, likely from prior cholecystectomy. Gaseous distention is noted in the upper abdomen, including what appear to be dilated loops of small bowel, similar to prior studies. IMPRESSION: 1. Support apparatus, as above. 2. Low lung volumes without radiographic evidence of acute cardiopulmonary disease. 3. Aortic atherosclerosis. 4. Dilated loops of small bowel in the upper abdomen suggestive of underlying small bowel obstruction, similar to prior abdominal radiographs. Electronically Signed   By: Toribio Aye M.D.   On: 02/16/2023 05:43   DG Abd Portable 1V Result Date: 02/15/2023 CLINICAL DATA:  Small bowel obstruction EXAM: PORTABLE ABDOMEN - 1 VIEW COMPARISON:  Yesterday FINDINGS: Enteric tube tip and side  port at the stomach. Continued dilatation of small bowel with gas filling. Some contrast has reached the proximal colon. IMPRESSION: Some contrast has reached the colon but there is ongoing small bowel obstruction with unchanged diffuse gaseous distension of small bowel. Electronically Signed   By: Dorn Roulette M.D.   On: 02/15/2023 08:04   DG Abd Portable 1V Result Date: 02/14/2023 CLINICAL DATA:  Small-bowel obstruction EXAM: PORTABLE ABDOMEN - 1 VIEW COMPARISON:  02/14/2023 FINDINGS: Supine frontal view of the abdomen and pelvis excludes the right hemidiaphragm by collimation. Enteric catheter tip within the gastric fundus. Oral contrast remains within the stomach. Minimal dilute contrast is seen within dilated loops of small bowel, consistent with small-bowel obstruction. There is no oral contrast seen within the expected region of the colon. IMPRESSION: 1. Minimal progression of oral contrast, with dilute contrast seen within dilated loops of small bowel throughout the abdomen, consistent with small-bowel obstruction. No transit of contrast into the colon by the time of imaging. Electronically  Signed   By: Ozell Daring M.D.   On: 02/14/2023 21:51   DG Abd Portable 1V-Small Bowel Obstruction Protocol-initial, 8 hr delay Result Date: 02/14/2023 CLINICAL DATA:  Follow-up exam for small bowel obstruction. EXAM: PORTABLE ABDOMEN - 1 VIEW COMPARISON:  02/13/2023 FINDINGS: Enteric tube tip is scratch set enteric tube is again noted with tip and side port in the stomach. Enteric contrast material opacifies the gastric fundus. Only a small amount of dilute enteric contrast material is noted in the expected location of the proximal duodenum. No significant antegrade progression of the contrast material into the small bowel noted. Persistent dilated small bowel loops are identified measuring up to 3.7 cm. This is unchanged compared with the previous exam. IMPRESSION: 1. Persistent dilated small bowel loops compatible with small bowel obstruction. 2. Enteric contrast is identified within the gastric fundus. Just a small amount of dilute contrast is noted in the expected location of the proximal duodenum. Electronically Signed   By: Waddell Calk M.D.   On: 02/14/2023 11:25   DG Abd Portable 1V-Small Bowel Protocol-Position Verification Result Date: 02/13/2023 CLINICAL DATA:  Check gastric catheter placement EXAM: PORTABLE ABDOMEN - 1 VIEW COMPARISON:  None Available. FINDINGS: Gastric catheter is noted within the stomach. Contrast is noted from recent CT examination. Small-bowel dilatation is seen similar to that noted on recent CT examination. No free air is noted. IMPRESSION: Gastric catheter within the stomach. Persistent small bowel dilatation. Electronically Signed   By: Oneil Devonshire M.D.   On: 02/13/2023 20:24   CT Angio Abd/Pel w/ and/or w/o Result Date: 02/13/2023 CLINICAL DATA:  Mesenteric ischemia. EXAM: CTA ABDOMEN AND PELVIS WITHOUT AND WITH CONTRAST TECHNIQUE: Multidetector CT imaging of the abdomen and pelvis was performed using the standard protocol during bolus administration of intravenous  contrast. Multiplanar reconstructed images and MIPs were obtained and reviewed to evaluate the vascular anatomy. RADIATION DOSE REDUCTION: This exam was performed according to the departmental dose-optimization program which includes automated exposure control, adjustment of the mA and/or kV according to patient size and/or use of iterative reconstruction technique. CONTRAST:  OMNIPAQUE  IOHEXOL  350 MG/ML SOLN COMPARISON:  Abdomen and pelvis CT 02/09/2023 FINDINGS: VASCULAR Aorta: Normal caliber aorta without aneurysm, dissection, vasculitis or significant stenosis. Moderate atherosclerotic disease. Celiac: Patent without evidence of aneurysm, dissection, vasculitis or significant stenosis. SMA: Patent without evidence of aneurysm, dissection, vasculitis or significant stenosis. Renals: Both renal arteries are patent without evidence of aneurysm, dissection, vasculitis, fibromuscular dysplasia or significant stenosis. IMA:  Patent without evidence of aneurysm, dissection, vasculitis or significant stenosis. Inflow: Patent without evidence of aneurysm, dissection, vasculitis or significant stenosis. Proximal Outflow: Bilateral common femoral and visualized portions of the superficial and profunda femoral arteries are patent without evidence of aneurysm, dissection, vasculitis or significant stenosis. Veins: Portal vein and superior mesenteric vein are patent. No discernible venous thrombosis within the small bowel mesentery Review of the MIP images confirms the above findings. NON-VASCULAR Lower chest: Tiny bilateral pleural effusions with dependent atelectasis. Hepatobiliary: No suspicious focal abnormality within the liver parenchyma. Small area of low attenuation in the anterior liver, adjacent to the falciform ligament, is in a characteristic location for focal fatty deposition. Gallbladder is surgically absent. No intrahepatic or extrahepatic biliary dilation. Pancreas: No focal mass lesion. No dilatation of  the main duct. No intraparenchymal cyst. No peripancreatic edema. Spleen: No splenomegaly. No suspicious focal mass lesion. Adrenals/Urinary Tract: No adrenal nodule or mass. Tiny well-defined homogeneous low-density lesions in both kidneys are too small to characterize but are statistically most likely benign and probably cysts. No followup imaging is recommended. No evidence for hydroureter. The urinary bladder appears normal for the degree of distention. Stomach/Bowel: Stomach is unremarkable. No gastric wall thickening. No evidence of outlet obstruction. Duodenum is normally positioned as is the ligament of Treitz. 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 On today's exam, there is a fluid collection in the region of the diverticulum with multiple tiny gas bubbles associated. Coronal image 72/14 raises the question of a contained fluid collection, but there is no duodenal wall thickening in these changes are probably all related to the patient's known diverticulum. Small bowel loops have become more diffusely distended in the interval measuring up to 3.2 cm diameter. No discernible small bowel wall thickening and no areas of small-bowel mural non-enhancement are evident. There is no small bowel pneumatosis. Patient is noted to have a completely decompressed distal and terminal ileum with an apparent abrupt transition zone in the right pelvis (see axial 136/5 and coronal 48/14). The appendix is not visible compatible with reported history of appendectomy. No gross colonic mass. No colonic wall thickening. Diverticuli are seen scattered along the entire length of the colon without CT findings of diverticulitis. Lymphatic: There is no gastrohepatic or hepatoduodenal ligament lymphadenopathy. No retroperitoneal or mesenteric lymphadenopathy. No pelvic sidewall lymphadenopathy. Reproductive: Hysterectomy.  There is no adnexal mass. Other: Increasing free fluid is seen in the abdomen and pelvis  with mesenteric edema/congestion also progressive since prior. Musculoskeletal: Tiny umbilical hernia contains fat and trace fluid. Gas bubbles in the subcutaneous fat of the right lower anterior abdominal wall likely related to an injection site. No worrisome lytic or sclerotic osseous abnormality. IMPRESSION: 1. No definite CT evidence for mesenteric ischemia. Mesenteric arterial anatomy is patent. No evidence for filling defect within the central mesenteric venous anatomy. No small bowel wall thickening. No small bowel pneumatosis. No areas of small-bowel wall non-enhancement are identified. No intraperitoneal free gas. 2. Small bowel loops have become more diffusely distended in the interval measuring up to 3.2 cm diameter. Patient is noted to have a completely decompressed distal and terminal ileum with an apparent abrupt transition zone in the right pelvis. This abrupt transition zone corresponds to the abnormality identified on the CT scan from 03-08-23, but appears more pronounced on today's exam likely due to the increased bowel distension. Imaging features raise concern for small-bowel obstruction, potentially secondary to an adhesion. Internal hernia/closed loop obstruction is not excluded. 3. Increasing free  fluid in the abdomen and pelvis with mesenteric edema/congestion also progressive since prior. 4. 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 On today's exam, there is a fluid collection in the region of the diverticulum with multiple tiny gas bubbles associated. The anatomy of the diverticulum is not well demonstrated on today's study and these findings are likely related to the duodenal diverticulum. Contain retroperitoneal duodenal perforation is considered less likely but not entirely excluded. 5. Tiny bilateral pleural effusions with dependent atelectasis. 6.  Aortic Atherosclerosis (ICD10-I70.0). I discussed these findings by telephone with Dr. Regalado at approximately  1425 hours on 02/13/2023. Electronically Signed   By: Camellia Candle M.D.   On: 02/13/2023 14:25   CT ABDOMEN PELVIS W CONTRAST Result Date: 02/09/2023 CLINICAL DATA:  Acute abdominal pain, nonlocalized EXAM: CT ABDOMEN AND PELVIS WITH CONTRAST TECHNIQUE: Multidetector CT imaging of the abdomen and pelvis was performed using the standard protocol following bolus administration of intravenous contrast. RADIATION DOSE REDUCTION: This exam was performed according to the departmental dose-optimization program which includes automated exposure control, adjustment of the mA and/or kV according to patient size and/or use of iterative reconstruction technique. CONTRAST:  OMNIPAQUE  IOHEXOL  300 MG/ML  SOLN COMPARISON:  CT 02/10/2020 FINDINGS: Lower chest: No pleural or pericardial effusion. Scattered coronary and aortic calcifications. Coarse subpleural linear scarring or atelectasis in cystic change posteriorly in the lung bases. Hepatobiliary: No focal liver abnormality is seen. Status post cholecystectomy. No biliary dilatation. Pancreas: Unremarkable. No pancreatic ductal dilatation or surrounding inflammatory changes. Spleen: Normal in size without focal abnormality. Adrenals/Urinary Tract: No adrenal mass. Symmetric renal enhancement without urolithiasis or hydronephrosis. Multiple cortical lesions in both kidneys, some of which can be characterized as cysts, largest 1.1 cm 21 HU left upper pole; no followup recommended. Urinary bladder is distended. Stomach/Bowel: Stomach is partially distended, without acute finding. Fluid distended small bowel loops in the left lower abdomen. Transition zone in the right lower quadrant with a short loop of small bowel showing some circumferential wall thickening, and relatively decreased mucosal enhancement, with regional inflammatory/edematous changes in the mesentery (Im26-29,[id number]) . post appendectomy. The colon is partially distended, with multiple descending and sigmoid  diverticula; no adjacent inflammatory change. Vascular/Lymphatic: Heavy calcified aortoiliac atheromatous plaque without aneurysm. Portal vein patent. No abdominal or pelvic adenopathy. Reproductive: Status post hysterectomy. No adnexal masses. Other: Small amount of free fluid in the pelvis. Small volume right lower quadrant ascites adjacent to the abnormal loop of bowel as above. No upper abdominal ascites. No free air. Musculoskeletal: Thoracolumbar lower levoscoliosis apex L2 with multilevel lumbar spondylitic change. Grade 1 anterolisthesis L5-S1 attributed to advanced facet DJD. IMPRESSION: 1. Short segment of abnormal small bowel in the right lower quadrant with regional inflammatory/edematous changes in the mesentery and small volume ascites. Appearance is nonspecific, considerations include localized infectious, inflammatory, or ischemic enteritis. 2. Descending and sigmoid diverticulosis. 3.  Aortic Atherosclerosis (ICD10-I70.0). Electronically Signed   By: JONETTA Faes M.D.   On: 02/09/2023 16:24    Labs: BNP (last 3 results) No results for input(s): BNP in the last 8760 hours. Basic Metabolic Panel: Recent Labs  Lab 02/21/23 0341 02/21/23 0919 02/22/23 0308 02/23/23 0034 02/24/23 0324 02/25/23 0520  NA 135 136 136 137 135 137  K 4.0 3.9 4.0 4.1 3.9 4.2  CL 103 105 106 105 103 103  CO2 23 24 24 23 25 27   GLUCOSE 125* 132* 133* 114* 95 88  BUN 23 23 22 21  24* 23  CREATININE 0.52  0.45 0.43* 0.59 0.56 0.81  CALCIUM  8.6* 8.5* 8.5* 8.9 8.8* 9.1  MG 2.1  --   --   --   --   --   PHOS 2.6  --   --   --   --   --    Liver Function Tests: Recent Labs  Lab 02/21/23 0341  AST 86*  ALT 94*  ALKPHOS 123  BILITOT 0.3  PROT 5.9*  ALBUMIN  2.9*   No results for input(s): LIPASE, AMYLASE in the last 168 hours. No results for input(s): AMMONIA in the last 168 hours. CBC: Recent Labs  Lab 02/21/23 0919 02/22/23 0308 02/23/23 0034 02/24/23 0324 02/25/23 0520  WBC 10.2 8.0  9.0 8.7 8.7  HGB 11.5* 10.7* 11.3* 11.1* 12.0  HCT 35.4* 33.7* 34.0* 34.5* 36.2  MCV 102.0* 103.4* 100.9* 102.4* 100.0  PLT 329 318 353 379 404*   Cardiac Enzymes: No results for input(s): CKTOTAL, CKMB, CKMBINDEX, TROPONINI in the last 168 hours. BNP: Invalid input(s): POCBNP CBG: Recent Labs  Lab 02/20/23 1544 02/20/23 2056 02/21/23 0519 02/21/23 0549 02/21/23 0716  GLUCAP 143* 132* 127* 103* 113*  Anemia work up No results for input(s): VITAMINB12, FOLATE, FERRITIN, TIBC, IRON, RETICCTPCT in the last 72 hours. Urinalysis    Component Value Date/Time   COLORURINE STRAW (A) 02/09/2023 1538   APPEARANCEUR CLEAR 02/09/2023 1538   LABSPEC 1.029 02/09/2023 1538   PHURINE 6.0 02/09/2023 1538   GLUCOSEU NEGATIVE 02/09/2023 1538   HGBUR NEGATIVE 02/09/2023 1538   BILIRUBINUR NEGATIVE 02/09/2023 1538   KETONESUR 5 (A) 02/09/2023 1538   PROTEINUR NEGATIVE 02/09/2023 1538   NITRITE NEGATIVE 02/09/2023 1538   LEUKOCYTESUR NEGATIVE 02/09/2023 1538   Sepsis Labs Recent Labs  Lab 02/22/23 0308 02/23/23 0034 02/24/23 0324 02/25/23 0520  WBC 8.0 9.0 8.7 8.7   Microbiology No results found for this or any previous visit (from the past 240 hours).   Time coordinating discharge: 35 minutes  SIGNED: Mennie LAMY, MD  [facility] 02/26/2023, 10:57 AM  If 7PM-7AM, please contact night-coverage [email or web address]

## 2023-02-25 NOTE — TOC Progression Note (Addendum)
 Transition of Care [facility]) - Progression Note    Patient Details  Name: Candice Brown MRN: [id number] Date of Birth: 1931/12/28  [facility] CM/SW Contact  Hoy DELENA Bigness, LCSW Phone Number: 02/25/2023, 11:11 AM  Clinical Narrative:    VM left w/ Katie at [facility] to discuss pt discharging to their facility.   Pt' insurance auth still pending. PT note from 1/13 faxed into [employer] for review for auth.    Update: Received call from Katie at [facility]. Pt able to transfer to their SNF once insurance shara has been approved.    ADDENDUM: Pt's insurance is offering a peer to peer for SNF. MD to call in at [phone] option 5. Peer to peer has to be completed by Tuesday 1/14 at 10:30am. MD notified.       Expected Discharge Plan and Services         Expected Discharge Date: 02/24/23                                     Social Determinants of Health (SDOH) Interventions SDOH Screenings   Food Insecurity: No Food Insecurity (02/16/2023)  Housing: Low Risk  (02/16/2023)  Transportation Needs: No Transportation Needs (02/16/2023)  Utilities: Not At Risk (02/16/2023)  Social Connections: Socially Integrated (02/16/2023)  Stress: No Stress Concern Present (07/21/2021)  Tobacco Use: High Risk (02/15/2023)    Readmission Risk Interventions     No data to display

## 2023-02-25 NOTE — Care Management Important Message (Signed)
 Important Message  Patient Details IM Letter given. Name: VALRIE JIA MRN: [id number] Date of Birth: February 26, 1931   Important Message Given:  Yes - Medicare IM     Caren Macadam 02/25/2023, 11:31 AM

## 2023-02-25 NOTE — Progress Notes (Signed)
 10 Days Post-Op   Subjective/Chief Complaint: Tol diet, having bowel function, no issues   Objective: Vital signs in last 24 hours: Temp:  [97.7 F (36.5 C)] 97.7 F (36.5 C) (01/13 0507) Pulse Rate:  [65-68] 65 (01/13 0507) Resp:  [16-19] 16 (01/13 0507) BP: (117-121)/(59-62) 121/62 (01/13 0507) SpO2:  [92 %-93 %] 92 % (01/13 0507) Weight:  [48.3 kg] 48.3 kg (01/13 0614) Last BM Date : 02/24/23  Intake/Output from previous day: 01/12 0701 - 01/13 0700 In: -  Out: 450 [Urine:450] Intake/Output this shift: No intake/output data recorded.  Ab soft nontender nondistended incision clean  Lab Results:  Recent Labs    02/24/23 0324 02/25/23 0520  WBC 8.7 8.7  HGB 11.1* 12.0  HCT 34.5* 36.2  PLT 379 404*   BMET Recent Labs    02/24/23 0324 02/25/23 0520  NA 135 137  K 3.9 4.2  CL 103 103  CO2 25 27  GLUCOSE 95 88  BUN 24* 23  CREATININE 0.56 0.81  CALCIUM  8.8* 9.1   PT/INR No results for input(s): LABPROT, INR in the last 72 hours. ABG No results for input(s): PHART, HCO3 in the last 72 hours.  Invalid input(s): PCO2, PO2  Studies/Results: No results found.  Anti-infectives: Anti-infectives (From admission, onward)    Start     Dose/Rate Route Frequency Ordered Stop   02/16/23 0600  ceFAZolin  (ANCEF ) IVPB 2g/100 mL premix        2 g 200 mL/hr over 30 Minutes Intravenous On call to O.R. 02/15/23 1124 02/15/23 1313   02/15/23 1158  piperacillin -tazobactam (ZOSYN ) 3.375 GM/50ML IVPB  Status:  Discontinued       Note to Pharmacy: Gaines, Janel R: cabinet override      02/15/23 1158 02/15/23 1202   02/13/23 1200  piperacillin -tazobactam (ZOSYN ) IVPB 3.375 g  Status:  Discontinued        3.375 g 12.5 mL/hr over 240 Minutes Intravenous Every 8 hours 02/13/23 0918 02/15/23 0916   02/12/23 1230  amoxicillin -clavulanate (AUGMENTIN ) 875-125 MG per tablet 1 tablet  Status:  Discontinued        1 tablet Oral Every 12 hours 02/12/23 1131 02/13/23 0902    02/10/23 1400  piperacillin -tazobactam (ZOSYN ) IVPB 3.375 g  Status:  Discontinued        3.375 g 12.5 mL/hr over 240 Minutes Intravenous Every 8 hours 02/10/23 1205 02/12/23 1131   02/10/23 1000  amoxicillin -clavulanate (AUGMENTIN ) 875-125 MG per tablet 1 tablet  Status:  Discontinued        1 tablet Oral Every 12 hours 02/10/23 0809 02/10/23 1205   02/09/23 1700  piperacillin -tazobactam (ZOSYN ) IVPB 3.375 g        3.375 g 12.5 mL/hr over 240 Minutes Intravenous  Once 02/09/23 1652 02/09/23 2145       Assessment/Plan: SBO, POD 10 s/p ex lap with lysis of adhesive band.  - Continue soft, low-fiber diet - Patient is stable for discharge from a surgical standpoint. Discharge instructions have been placed in AVS. Will arrange for postop follow up in 3-4 weeks.  -will sign off as appears dc imminent   Candice Brown 02/25/2023

## 2023-02-26 NOTE — Progress Notes (Signed)
 Patient has been waiting for placement She is tolerating diet does have liquid bowel movement advised to follow-up with labs in a week No change in discharge summary and meds please refer to discharge summary completed previously. She is stable for discharge

## 2023-02-26 NOTE — TOC Transition Note (Signed)
 Transition of Care [facility]) - Discharge Note   Patient Details  Name: Candice Brown MRN: [id number] Date of Birth: September 19, 1931  Transition of Care [facility]) CM/SW Contact:  Hoy DELENA Bigness, LCSW Phone Number: 02/26/2023, 11:15 AM   Clinical Narrative:    Pt's insurance approved for SNF. Pt will be transferring to [facility] and will be going to [facility] room 63. RN to call report to [phone] ext [phone]. DC packet placed at RN station. Pt's daughter to provide transportation at discharge.    Final next level of care: Skilled Nursing Facility Barriers to Discharge: No Barriers Identified   Patient Goals and CMS Choice Patient states their goals for this hospitalization and ongoing recovery are:: To go to SNF CMS Medicare.gov Compare Post Acute Care list provided to:: Patient Choice offered to / list presented to : Patient [place] ownership interest in [facility].provided to:: Patient    Discharge Placement PASRR number recieved: 02/19/23            Patient chooses bed at: [facility] Patient to be transferred to facility by: daughter Name of family member notified: patient Patient and family notified of of transfer: 02/26/23  Discharge Plan and Services Additional resources added to the After Visit Summary for                  DME Arranged: N/A DME Agency: NA                  Social Drivers of Health (SDOH) Interventions SDOH Screenings   Food Insecurity: No Food Insecurity (02/16/2023)  Housing: Low Risk  (02/16/2023)  Transportation Needs: No Transportation Needs (02/16/2023)  Utilities: Not At Risk (02/16/2023)  Social Connections: Socially Integrated (02/16/2023)  Stress: No Stress Concern Present (07/21/2021)  Tobacco Use: High Risk (02/15/2023)     Readmission Risk Interventions    02/26/2023   11:14 AM  Readmission Risk Prevention Plan  Post Dischage Appt Complete  Medication Screening Complete  Transportation Screening Complete

## 2023-02-26 NOTE — Plan of Care (Signed)
  Problem: Education: Goal: Knowledge of General Education information will improve Description: Including pain rating scale, medication(s)/side effects and non-pharmacologic comfort measures Outcome: Adequate for Discharge   Problem: Health Behavior/Discharge Planning: Goal: Ability to manage health-related needs will improve Outcome: Adequate for Discharge   Problem: Clinical Measurements: Goal: Ability to maintain clinical measurements within normal limits will improve Outcome: Adequate for Discharge Goal: Will remain free from infection Outcome: Adequate for Discharge Goal: Diagnostic test results will improve Outcome: Adequate for Discharge Goal: Respiratory complications will improve Outcome: Adequate for Discharge   Problem: Activity: Goal: Risk for activity intolerance will decrease Outcome: Adequate for Discharge   Problem: Nutrition: Goal: Adequate nutrition will be maintained Outcome: Adequate for Discharge   Problem: Coping: Goal: Level of anxiety will decrease Outcome: Adequate for Discharge   Problem: Elimination: Goal: Will not experience complications related to bowel motility Outcome: Adequate for Discharge Goal: Will not experience complications related to urinary retention Outcome: Adequate for Discharge   Problem: Pain Management: Goal: General experience of comfort will improve Outcome: Adequate for Discharge   Problem: Safety: Goal: Ability to remain free from injury will improve Outcome: Adequate for Discharge   Problem: Skin Integrity: Goal: Risk for impaired skin integrity will decrease Outcome: Adequate for Discharge   Problem: Education: Goal: Ability to describe self-care measures that may prevent or decrease complications (Diabetes Survival Skills Education) will improve Outcome: Adequate for Discharge Goal: Individualized Educational Video(s) Outcome: Adequate for Discharge   Problem: Coping: Goal: Ability to adjust to condition or  change in health will improve Outcome: Adequate for Discharge   Problem: Fluid Volume: Goal: Ability to maintain a balanced intake and output will improve Outcome: Adequate for Discharge   Problem: Health Behavior/Discharge Planning: Goal: Ability to identify and utilize available resources and services will improve Outcome: Adequate for Discharge Goal: Ability to manage health-related needs will improve Outcome: Adequate for Discharge   Problem: Metabolic: Goal: Ability to maintain appropriate glucose levels will improve Outcome: Adequate for Discharge   Problem: Nutritional: Goal: Maintenance of adequate nutrition will improve Outcome: Adequate for Discharge Goal: Progress toward achieving an optimal weight will improve Outcome: Adequate for Discharge   Problem: Skin Integrity: Goal: Risk for impaired skin integrity will decrease Outcome: Adequate for Discharge   Problem: Tissue Perfusion: Goal: Adequacy of tissue perfusion will improve Outcome: Adequate for Discharge

## 2023-02-28 ENCOUNTER — Non-Acute Institutional Stay (SKILLED_NURSING_FACILITY): Payer: Self-pay | Admitting: Sports Medicine

## 2023-02-28 DIAGNOSIS — K5900 Constipation, unspecified: Secondary | ICD-10-CM

## 2023-02-28 DIAGNOSIS — E782 Mixed hyperlipidemia: Secondary | ICD-10-CM

## 2023-02-28 DIAGNOSIS — C50411 Malignant neoplasm of upper-outer quadrant of right female breast: Secondary | ICD-10-CM

## 2023-02-28 DIAGNOSIS — K56609 Unspecified intestinal obstruction, unspecified as to partial versus complete obstruction: Secondary | ICD-10-CM

## 2023-02-28 DIAGNOSIS — Z170 Estrogen receptor positive status [ER+]: Secondary | ICD-10-CM

## 2023-02-28 DIAGNOSIS — M25552 Pain in left hip: Secondary | ICD-10-CM

## 2023-02-28 DIAGNOSIS — M25551 Pain in right hip: Secondary | ICD-10-CM

## 2023-02-28 NOTE — Progress Notes (Signed)
Provider:   Venita Sheffield MD Location:   [facility]    Place of Service:   Skilled care facility   PCP: Geoffry Paradise, MD Patient Care Team: Geoffry Paradise, MD as PCP - General (Internal Medicine) Valeria Batman, MD (Inactive) as Consulting Physician (Orthopedic Surgery) Lavonna Monarch, MD as Referring Physician (Neurosurgery) Rachel Moulds, MD as Consulting Physician (Hematology and Oncology) Dorothy Puffer, MD as Consulting Physician (Radiation Oncology) Almond Lint, MD as Consulting Physician (General Surgery)  Extended Emergency Contact Information Primary Emergency Contact: Dellarocco,Juliet Address: [address]          [place], [place] Home Phone: [phone] Mobile Phone: [phone] Relation: Daughter Secondary Emergency Contact: Johna Sheriff) [facility] Address: [address]          [place], [place] [address] [place] Home Phone: [phone] Mobile Phone: [phone] Relation: Daughter  Code Status:  Goals of Care: Advanced Directive information    02/16/2023    7:31 PM  Advanced Directives  Copy of Healthcare Power of Attorney in Chart? Yes - validated most recent copy scanned in chart (See row information)      No chief complaint on file.   HPI: Patient is a 89 y.o. female seen today for admission to skilled care. Pt seen and examined in her room. She is sitting in the recliner chair , talking to her family over the phone. Pt c/o  severe bilateral hip pain, which she believes to be arthritis-related. Patient reports that pain  began in the hospital and has worsened since, is localized to the hip area and does not radiate down the legs. The pain is exacerbated by movement, particularly when standing up or sitting down.  SBO S/P exp lap  patient denies any abdominal pain or nausea since her arrival from the hospital, although she did experience some nausea while in the hospital. Her  last bowel movement was yesterday and was soft, which is unusual as she typically experiences constipation. She has a history of diarrhea and incontinence, which was resolved with antibiotics, but has since left her with constipation. She manages this with a regimen of Citrucel and Fibercon.   The patient has been taking cholesterol medication twice a week due to cramping when taken daily. She has a history of taking sertraline for depression and anxiety when she first moved from her house to a friend's place, but she has not taken it for a couple of years. 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    Past Medical History:  Diagnosis Date   Anemia    Arthritis    Colon polyps    hyperplastic   Complication of anesthesia    hard to wake up   Depression    Diverticulosis    GERD (gastroesophageal reflux disease)    Hiatal hernia    Hyperlipidemia    IBS (irritable bowel syndrome)    Internal hemorrhoids    Lung nodule    Occipital neuralgia    Peripheral neuropathy    Past Surgical History:  Procedure Laterality Date   ABDOMINAL HYSTERECTOMY  1972   APPENDECTOMY  1959   BOWEL RESECTION N/A 02/15/2023   Procedure: LYSIS OF ADHESIONS;  Surgeon: Berna Bue, MD;  Location: [facility];  Service: General;  Laterality: N/A;   BREAST LUMPECTOMY WITH RADIOACTIVE SEED LOCALIZATION Right 07/18/2021   Procedure: RIGHT BREAST LUMPECTOMY WITH RADIOACTIVE SEED  LOCALIZATION;  Surgeon: Almond Lint, MD;  Location: [facility];  Service: General;  Laterality: Right;   CATARACT EXTRACTION Bilateral    left 03/2014, right eye 06/2016   CHOLECYSTECTOMY     LAPAROTOMY N/A 02/15/2023   Procedure: EXPLORATORY LAPAROTOMY;  Surgeon: Berna Bue, MD;  Location: [facility];  Service: General;  Laterality: N/A;    reports that she has been smoking cigarettes. She has never used smokeless tobacco. She reports current alcohol use. She  reports that she does not use drugs. Social History   Socioeconomic History   Marital status: Divorced    Spouse name: Not on file   Number of children: 3   Years of education: Masters   Highest education level: Not on file  Occupational History   Occupation: retired  Tobacco Use   Smoking status: Every Day    Current packs/day: 0.00    Types: Cigarettes    Last attempt to quit: 10/13/2016    Years since quitting: 6.3   Smokeless tobacco: Never   Tobacco comments:    5 cigarettes per week.    12/31/18  nicotene gum, lozenges  Vaping Use   Vaping status: Never Used  Substance and Sexual Activity   Alcohol use: Yes    Alcohol/week: 0.0 standard drinks of alcohol    Comment: less than 1/2 glass wine daily   Drug use: No   Sexual activity: Not on file  Other Topics Concern   Not on file  Social History Narrative   12/31/18 Lives alone   Caffeine- coffee, 1- 1 1/2 cups daily   Social Drivers of Corporate investment banker Strain: Not on file  Food Insecurity: No Food Insecurity (02/16/2023)   Hunger Vital Sign    Worried About Running Out of Food in the Last Year: Never true    Ran Out of Food in the Last Year: Never true  Transportation Needs: No Transportation Needs (02/16/2023)   PRAPARE - Administrator, Civil Service (Medical): No    Lack of Transportation (Non-Medical): No  Physical Activity: Not on file  Stress: No Stress Concern Present (07/21/2021)   [employer] of Occupational Health - Occupational Stress Questionnaire    Feeling of Stress : Not at all  Social Connections: Socially Integrated (02/16/2023)   Social Connection and Isolation Panel [NHANES]    Frequency of Communication with Friends and Family: Three times a week    Frequency of Social Gatherings with Friends and Family: Three times a week    Attends Religious Services: 1 to 4 times per year    Active Member of Clubs or Organizations: Yes    Attends Banker Meetings: 1 to 4  times per year    Marital Status: Married  Catering manager Violence: Not At Risk (02/16/2023)   Humiliation, Afraid, Rape, and Kick questionnaire    Fear of Current or Ex-Partner: No    Emotionally Abused: No    Physically Abused: No    Sexually Abused: No    Functional Status Survey:    Family History  Problem Relation Age of Onset   Breast cancer Mother 68   Heart disease Father    CVA Father    Heart attack Father 22   Heart attack Paternal Grandfather    Colon cancer Neg Hx    Stomach cancer Neg Hx    Rectal cancer Neg Hx    Esophageal cancer Neg Hx     Health Maintenance  Topic Date Due   COVID-19 Vaccine (1) Never done   Pneumonia Vaccine 57+ Years old (1 of 2 - PCV) Never done   Zoster Vaccines- Shingrix (1 of 2) 08/20/1950   Medicare Annual Wellness (AWV)  03/13/2022   INFLUENZA VACCINE  09/13/2022   DTaP/Tdap/Td (2 - Td or Tdap) 12/10/2028   DEXA SCAN  Completed   HPV VACCINES  Aged Out    Allergies  Allergen Reactions   Linzess [Linaclotide] Diarrhea    Increased leg cramps   Oxycodone Nausea Only     ( made very sick)   Simvastatin Other (See Comments)    Leg cramps   Benzalkonium Chloride Rash    In neosporin   Neosporin [Neomycin-Polymyxin-Gramicidin] Rash   Tape Rash    Adhesive patches of any kind    Outpatient Encounter Medications as of 02/28/2023  Medication Sig   anastrozole (ARIMIDEX) 1 MG tablet Take 1 tablet (1 mg total) by mouth daily.   Aspirin-Caffeine 500-32.5 MG TABS Take 1 tablet by mouth as needed (pain).   diphenhydramine-acetaminophen (TYLENOL PM) 25-500 MG TABS tablet Take 2 tablets by mouth at bedtime as needed (Sleep).   Methylcellulose, Laxative, (CITRUCEL PO) Take 1 tablet by mouth daily.   Multiple Vitamin (MULTI VITAMIN) TABS Take 1 tablet by mouth daily. Senior woman   nicotine polacrilex (NICORETTE) 4 MG gum Take 4 mg by mouth as needed for smoking cessation.   NON FORMULARY Take 2 tablets by mouth at bedtime. [place]  leg cramp pills   pantoprazole (PROTONIX) 40 MG tablet Take 40 mg by mouth daily.    polycarbophil (FIBERCON) 625 MG tablet Take 1,250 mg by mouth at bedtime.   Polyvinyl Alcohol-Povidone (REFRESH OP) Place 1 drop into both eyes daily as needed (Dry eye).   pregabalin (LYRICA) 150 MG capsule Take 150 mg by mouth daily.   Probiotic Product (ALIGN PO) Take 1 tablet by mouth daily.   rosuvastatin (CRESTOR) 10 MG tablet Take 10 mg by mouth 2 (two) times a week.   No facility-administered encounter medications on file as of 02/28/2023.    Review of Systems  Constitutional:  Negative for chills and fever.  Respiratory:  Negative for cough, shortness of breath and wheezing.   Cardiovascular:  Negative for chest pain, palpitations and leg swelling.  Gastrointestinal:  Negative for abdominal distention, abdominal pain, blood in stool, constipation, diarrhea, nausea and vomiting.  Genitourinary:  Negative for dysuria, frequency and urgency.  Musculoskeletal:  Positive for arthralgias.  Neurological:  Negative for dizziness.  Psychiatric/Behavioral:  Negative for confusion.     There were no vitals filed for this visit. There is no height or weight on file to calculate BMI. Physical Exam Constitutional:      Appearance: Normal appearance.  HENT:     Head: Normocephalic and atraumatic.  Cardiovascular:     Rate and Rhythm: Normal rate and regular rhythm.  Pulmonary:     Effort: Pulmonary effort is normal. No respiratory distress.     Breath sounds: Normal breath sounds. No wheezing.  Abdominal:     General: Bowel sounds are normal. There is no distension.     Tenderness: There is no abdominal tenderness. There is no guarding or rebound.     Comments: Incision - healing well Steri strips in place No erythema and tenderness on palpation    Musculoskeletal:        General: No swelling or tenderness.  Neurological:     Mental Status: She is alert. Mental  status is at baseline.     Sensory:  No sensory deficit.     Motor: No weakness.     Labs reviewed: Basic Metabolic Panel: Recent Labs    02/17/23 0337 02/18/23 0419 02/19/23 0410 02/21/23 0341 02/21/23 0919 02/23/23 0034 02/24/23 0324 02/25/23 0520  NA 137 136   < > 135   < > 137 135 137  K 3.3* 3.8   < > 4.0   < > 4.1 3.9 4.2  CL 103 104   < > 103   < > 105 103 103  CO2 26 25   < > 23   < > 23 25 27   GLUCOSE 105* 126*   < > 125*   < > 114* 95 88  BUN 20 15   < > 23   < > 21 24* 23  CREATININE 0.58 0.50   < > 0.52   < > 0.59 0.56 0.81  CALCIUM 8.1* 8.3*   < > 8.6*   < > 8.9 8.8* 9.1  MG 2.1 2.2  --  2.1  --   --   --   --   PHOS 1.8* 2.5  --  2.6  --   --   --   --    < > = values in this interval not displayed.   Liver Function Tests: Recent Labs    02/17/23 0337 02/18/23 0419 02/21/23 0341  AST 23 24 86*  ALT 19 19 94*  ALKPHOS 30* 40 123  BILITOT 0.6 0.6 0.3  PROT 5.2* 5.4* 5.9*  ALBUMIN 2.7* 2.8* 2.9*   Recent Labs    02/09/23 1135  LIPASE 88*   No results for input(s): "AMMONIA" in the last 8760 hours. CBC: Recent Labs    03/02/22 1327 02/09/23 1135 02/23/23 0034 02/24/23 0324 02/25/23 0520  WBC 6.8   < > 9.0 8.7 8.7  NEUTROABS 4.0  --   --   --   --   HGB 12.7   < > 11.3* 11.1* 12.0  HCT 38.8   < > 34.0* 34.5* 36.2  MCV 99.5   < > 100.9* 102.4* 100.0  PLT 281   < > 353 379 404*   < > = values in this interval not displayed.   Cardiac Enzymes: No results for input(s): "CKTOTAL", "CKMB", "CKMBINDEX", "TROPONINI" in the last 8760 hours. BNP: Invalid input(s): "POCBNP" Lab Results  Component Value Date   HGBA1C 5.5 02/18/2023   Lab Results  Component Value Date   TSH 1.290 12/31/2018   Lab Results  Component Value Date   VITAMINB12 354 02/14/2023   No results found for: "FOLATE" No results found for: "IRON", "TIBC", "FERRITIN"  Imaging and Procedures obtained prior to SNF admission: CT ABDOMEN PELVIS W CONTRAST Result Date: 02/09/2023 CLINICAL DATA:  Acute  abdominal pain, nonlocalized EXAM: CT ABDOMEN AND PELVIS WITH CONTRAST TECHNIQUE: Multidetector CT imaging of the abdomen and pelvis was performed using the standard protocol following bolus administration of intravenous contrast. RADIATION DOSE REDUCTION: This exam was performed according to the departmental dose-optimization program which includes automated exposure control, adjustment of the mA and/or kV according to patient size and/or use of iterative reconstruction technique. CONTRAST:  OMNIPAQUE IOHEXOL 300 MG/ML  SOLN COMPARISON:  CT 02/10/2020 FINDINGS: Lower chest: No pleural or pericardial effusion. Scattered coronary and aortic calcifications. Coarse subpleural linear scarring or atelectasis in cystic change posteriorly in the lung bases. Hepatobiliary: No focal liver abnormality is seen. Status post cholecystectomy.  No biliary dilatation. Pancreas: Unremarkable. No pancreatic ductal dilatation or surrounding inflammatory changes. Spleen: Normal in size without focal abnormality. Adrenals/Urinary Tract: No adrenal mass. Symmetric renal enhancement without urolithiasis or hydronephrosis. Multiple cortical lesions in both kidneys, some of which can be characterized as cysts, largest 1.1 cm 21 HU left upper pole; no followup recommended. Urinary bladder is distended. Stomach/Bowel: Stomach is partially distended, without acute finding. Fluid distended small bowel loops in the left lower abdomen. Transition zone in the right lower quadrant with a short loop of small bowel showing some circumferential wall thickening, and relatively decreased mucosal enhancement, with regional inflammatory/edematous changes in the mesentery ([id number],[id number]) . post appendectomy. The colon is partially distended, with multiple descending and sigmoid diverticula; no adjacent inflammatory change. Vascular/Lymphatic: Heavy calcified aortoiliac atheromatous plaque without aneurysm. Portal vein patent. No abdominal or pelvic  adenopathy. Reproductive: Status post hysterectomy. No adnexal masses. Other: Small amount of free fluid in the pelvis. Small volume right lower quadrant ascites adjacent to the abnormal loop of bowel as above. No upper abdominal ascites. No free air. Musculoskeletal: Thoracolumbar lower levoscoliosis apex L2 with multilevel lumbar spondylitic change. Grade 1 anterolisthesis L5-S1 attributed to advanced facet DJD. IMPRESSION: 1. Short segment of abnormal small bowel in the right lower quadrant with regional inflammatory/edematous changes in the mesentery and small volume ascites. Appearance is nonspecific, considerations include localized infectious, inflammatory, or ischemic enteritis. 2. Descending and sigmoid diverticulosis. 3.  Aortic Atherosclerosis (ICD10-I70.0). Electronically Signed   By: Corlis Leak M.D.   On: 02/09/2023 16:24    Assessment/Plan  SBO  Doing well Incision site - no gaping, no drainage No surrounding erythema     Hip Pain  pain in both hips, exacerbated by movement. No radiating pain. Likely arthritis.   -Start scheduled Tylenol 1000mg  TID.  Post-Operative Recovery No pain at surgical site. No signs of infection. No nausea or vomiting. -Continue with PT/ OT   Constipation History of constipation, currently managed with Citrucel and Fibercon. Recent soft bowel movement. -Continue current regimen. No need for additional stool softener at this time.  Hyperlipidemia Managed with cholesterol medication twice weekly due to history of cramps with daily use. -Continue current regimen.   H/o Breast cancer Cont with anastrazole   Care plan discussed with the nursing staff    35 min Total time spent for obtaining history,  performing a medically appropriate examination and evaluation, reviewing the tests,ordering  tests,  documenting clinical information in the electronic or other health record, ,care coordination (not separately reported)

## 2023-03-04 ENCOUNTER — Encounter: Payer: Self-pay | Admitting: Sports Medicine

## 2023-03-11 ENCOUNTER — Ambulatory Visit: Payer: Medicare Other

## 2023-03-11 ENCOUNTER — Other Ambulatory Visit (HOSPITAL_COMMUNITY): Payer: Medicare Other

## 2023-03-11 ENCOUNTER — Encounter: Payer: Self-pay | Admitting: Sports Medicine

## 2023-03-11 ENCOUNTER — Non-Acute Institutional Stay: Payer: Self-pay | Admitting: Sports Medicine

## 2023-03-11 DIAGNOSIS — K56609 Unspecified intestinal obstruction, unspecified as to partial versus complete obstruction: Secondary | ICD-10-CM

## 2023-03-11 NOTE — Progress Notes (Addendum)
Location:   [facility] of Service:   Skilled care  Provider: Venita Sheffield MD  PCP: Geoffry Paradise, MD Patient Care Team: Geoffry Paradise, MD as PCP - General (Internal Medicine) Valeria Batman, MD (Inactive) as Consulting Physician (Orthopedic Surgery) Lavonna Monarch, MD as Referring Physician (Neurosurgery) Rachel Moulds, MD as Consulting Physician (Hematology and Oncology) Dorothy Puffer, MD as Consulting Physician (Radiation Oncology) Almond Lint, MD as Consulting Physician (General Surgery)  Extended Emergency Contact Information Primary Emergency Contact: Awadallah,Juliet Address: [address]          [place], [place] Home Phone: [phone] Mobile Phone: [phone] Relation: Daughter Secondary Emergency Contact: Johna Sheriff) [facility] Address: [address]          [place], [place] [address] [place] Home Phone: [phone] Mobile Phone: [phone] Relation: Daughter   Goals of care:  Advanced Directive information    02/16/2023    7:31 PM  Advanced Directives  Copy of Healthcare Power of Attorney in Chart? Yes - validated most recent copy scanned in chart (See row information)     Allergies  Allergen Reactions   Linzess [Linaclotide] Diarrhea    Increased leg cramps   Oxycodone Nausea Only     ( made very sick)   Simvastatin Other (See Comments)    Leg cramps   Benzalkonium Chloride Rash    In neosporin   Neosporin [Neomycin-Polymyxin-Gramicidin] Rash   Tape Rash    Adhesive patches of any kind    No chief complaint on file.   HPI:  89 y.o. female  with h/o Breast cancer, GERD, HLD was admitted to skilled rehab after recent hospitalization for small bowel obstruction.  Hospitalized from 12/28 to 02/26/23. Pt seen and examined in her room. She seems pleasant and does not appear to be in distress. She is able to transfer independently and able to ambulate with a  rolator walker. Pt denies abdominal pain, nausea, vomiting. Last bowel movement yesterday. She is tolerating her Po intake.  Instructed patient to follow up with her PCP, General surgery. Pt says she does  not need refills.  As per hospital d/c summary    ''SBO S/P ex lap with LOA x 2 Dr. Doylene Canard. Ileus subsequent postop: Initial CT abdomen showed enteritis managed conservatively, repeat CT scan done as patient did not improve showed SBO s/p ex lap LO. Post op having ileus nausea vomiting diarrhea>NG tube reinserted 1/8 and removed 1/10 am. On diet tolerating well, okay for discharge per [facility] continue wound care follow-up with [facility] as outpatient. ''   BASIC METABOLIC PANEL GLUCOSE 77 mg/dL 53-66 Final  Fasting reference interval UREA NITROGEN (BUN) 16 mg/dL 4-40 Final CREATININE 3.47 mg/dL 4.25-9.56 Final EGFR 81 mL/min/1.73 m2 > OR = 60 Final BUN/CREATININE RATIO SEE NOTE: (calc) 6-22 Final  Not Reported: BUN and Creatinine are within  reference range. SODIUM 141 mmol/L 135-146 Final POTASSIUM 4.3 mmol/L 3.5-5.3 Final CHLORIDE 105 mmol/L 98-110 Final CARBON DIOXIDE 28 mmol/L 20-32 Final CALCIUM 9.2 mg/dL 3.8-75.6 Final CBC (H/H, RBC, INDICES, WBC, PLT) WHITE BLOOD CELL COUNT 7.5 Thousand/u L 3.8-10.8 Final RED BLOOD CELL COUNT 3.35 Million/uL 3.80-5.10 L Final HEMOGLOBIN 10.9 g/dL 43.3-29.5 L Final HEMATOCRIT 32.9 % 35.0-45.0 L Final MCV 98.2 fL 80.0-100.0 Final MCH 32.5 pg 27.0-33.0 Final MCHC 33.1 g/dL 18.8-41.6 Final  Past Medical History:  Diagnosis Date   Anemia    Arthritis    Colon polyps    hyperplastic  Complication of anesthesia    hard to wake up   Depression    Diverticulosis    GERD (gastroesophageal reflux disease)    Hiatal hernia    Hyperlipidemia    IBS (irritable bowel syndrome)    Internal hemorrhoids    Lung nodule    Occipital neuralgia    Peripheral neuropathy     Past Surgical History:  Procedure Laterality Date   ABDOMINAL  HYSTERECTOMY  1972   APPENDECTOMY  1959   BOWEL RESECTION N/A 02/15/2023   Procedure: LYSIS OF ADHESIONS;  Surgeon: Berna Bue, MD;  Location: [facility];  Service: General;  Laterality: N/A;   BREAST LUMPECTOMY WITH RADIOACTIVE SEED LOCALIZATION Right 07/18/2021   Procedure: RIGHT BREAST LUMPECTOMY WITH RADIOACTIVE SEED LOCALIZATION;  Surgeon: Almond Lint, MD;  Location: [facility];  Service: General;  Laterality: Right;   CATARACT EXTRACTION Bilateral    left 03/2014, right eye 06/2016   CHOLECYSTECTOMY     LAPAROTOMY N/A 02/15/2023   Procedure: EXPLORATORY LAPAROTOMY;  Surgeon: Berna Bue, MD;  Location: [facility];  Service: General;  Laterality: N/A;      reports that she has been smoking cigarettes. She has never used smokeless tobacco. She reports current alcohol use. She reports that she does not use drugs. Social History   Socioeconomic History   Marital status: Divorced    Spouse name: Not on file   Number of children: 3   Years of education: Masters   Highest education level: Not on file  Occupational History   Occupation: retired  Tobacco Use   Smoking status: Every Day    Current packs/day: 0.00    Types: Cigarettes    Last attempt to quit: 10/13/2016    Years since quitting: 6.4   Smokeless tobacco: Never   Tobacco comments:    5 cigarettes per week.    12/31/18  nicotene gum, lozenges  Vaping Use   Vaping status: Never Used  Substance and Sexual Activity   Alcohol use: Yes    Alcohol/week: 0.0 standard drinks of alcohol    Comment: less than 1/2 glass wine daily   Drug use: No   Sexual activity: Not on file  Other Topics Concern   Not on file  Social History Narrative   12/31/18 Lives alone   Caffeine- coffee, 1- 1 1/2 cups daily   Social Drivers of Corporate investment banker Strain: Not on file  Food Insecurity: No Food Insecurity (02/16/2023)   Hunger Vital Sign    Worried About Running Out of Food in the Last Year: Never true    Ran  Out of Food in the Last Year: Never true  Transportation Needs: No Transportation Needs (02/16/2023)   PRAPARE - Administrator, Civil Service (Medical): No    Lack of Transportation (Non-Medical): No  Physical Activity: Not on file  Stress: No Stress Concern Present (07/21/2021)   [employer] of Occupational Health - Occupational Stress Questionnaire    Feeling of Stress : Not at all  Social Connections: Socially Integrated (02/16/2023)   Social Connection and Isolation Panel [NHANES]    Frequency of Communication with Friends and Family: Three times a week    Frequency of Social Gatherings with Friends and Family: Three times a week    Attends Religious Services: 1 to 4 times per year    Active Member of Clubs or Organizations: Yes    Attends Banker Meetings: 1 to 4 times per  year    Marital Status: Married  Catering manager Violence: Not At Risk (02/16/2023)   Humiliation, Afraid, Rape, and Kick questionnaire    Fear of Current or Ex-Partner: No    Emotionally Abused: No    Physically Abused: No    Sexually Abused: No   Functional Status Survey:    Allergies  Allergen Reactions   Linzess [Linaclotide] Diarrhea    Increased leg cramps   Oxycodone Nausea Only     ( made very sick)   Simvastatin Other (See Comments)    Leg cramps   Benzalkonium Chloride Rash    In neosporin   Neosporin [Neomycin-Polymyxin-Gramicidin] Rash   Tape Rash    Adhesive patches of any kind    Pertinent  Health Maintenance Due  Topic Date Due   INFLUENZA VACCINE  09/13/2022   DEXA SCAN  Completed    Medications: Outpatient Encounter Medications as of 03/11/2023  Medication Sig   anastrozole (ARIMIDEX) 1 MG tablet Take 1 tablet (1 mg total) by mouth daily.   Aspirin-Caffeine 500-32.5 MG TABS Take 1 tablet by mouth as needed (pain).   diphenhydramine-acetaminophen (TYLENOL PM) 25-500 MG TABS tablet Take 2 tablets by mouth at bedtime as needed (Sleep).    Methylcellulose, Laxative, (CITRUCEL PO) Take 1 tablet by mouth daily.   Multiple Vitamin (MULTI VITAMIN) TABS Take 1 tablet by mouth daily. Senior woman   nicotine polacrilex (NICORETTE) 4 MG gum Take 4 mg by mouth as needed for smoking cessation.   NON FORMULARY Take 2 tablets by mouth at bedtime. [place]yland leg cramp pills   pantoprazole (PROTONIX) 40 MG tablet Take 40 mg by mouth daily.    polycarbophil (FIBERCON) 625 MG tablet Take 1,250 mg by mouth at bedtime.   Polyvinyl Alcohol-Povidone (REFRESH OP) Place 1 drop into both eyes daily as needed (Dry eye).   pregabalin (LYRICA) 150 MG capsule Take 150 mg by mouth daily.   Probiotic Product (ALIGN PO) Take 1 tablet by mouth daily.   rosuvastatin (CRESTOR) 10 MG tablet Take 10 mg by mouth 2 (two) times a week.   No facility-administered encounter medications on file as of 03/11/2023.    Review of Systems  Constitutional:  Negative for chills and fever.  Respiratory:  Negative for cough, shortness of breath and wheezing.   Cardiovascular:  Negative for chest pain, palpitations and leg swelling.  Gastrointestinal:  Negative for abdominal distention, abdominal pain, blood in stool, constipation, diarrhea, nausea and vomiting.  Genitourinary:  Negative for dysuria.  Neurological:  Negative for dizziness, weakness and numbness.    There were no vitals filed for this visit. There is no height or weight on file to calculate BMI. Physical Exam Constitutional:      Appearance: Normal appearance.  HENT:     Head: Normocephalic and atraumatic.  Cardiovascular:     Rate and Rhythm: Normal rate and regular rhythm.  Pulmonary:     Effort: Pulmonary effort is normal. No respiratory distress.     Breath sounds: Normal breath sounds. No wheezing.  Abdominal:     General: Bowel sounds are normal. There is no distension.     Tenderness: There is no abdominal tenderness. There is no guarding.     Comments: Incision healed well No gaping  No  tenderness on palpation    Musculoskeletal:        General: No swelling or tenderness.  Neurological:     Mental Status: She is alert. Mental status is at baseline.  Sensory: No sensory deficit.     Motor: No weakness.     Labs reviewed: Basic Metabolic Panel: Recent Labs    02/17/23 0337 02/18/23 0419 02/19/23 0410 02/21/23 0341 02/21/23 0919 02/23/23 0034 02/24/23 0324 02/25/23 0520  NA 137 136   < > 135   < > 137 135 137  K 3.3* 3.8   < > 4.0   < > 4.1 3.9 4.2  CL 103 104   < > 103   < > 105 103 103  CO2 26 25   < > 23   < > 23 25 27   GLUCOSE 105* 126*   < > 125*   < > 114* 95 88  BUN 20 15   < > 23   < > 21 24* 23  CREATININE 0.58 0.50   < > 0.52   < > 0.59 0.56 0.81  CALCIUM 8.1* 8.3*   < > 8.6*   < > 8.9 8.8* 9.1  MG 2.1 2.2  --  2.1  --   --   --   --   PHOS 1.8* 2.5  --  2.6  --   --   --   --    < > = values in this interval not displayed.   Liver Function Tests: Recent Labs    02/17/23 0337 02/18/23 0419 02/21/23 0341  AST 23 24 86*  ALT 19 19 94*  ALKPHOS 30* 40 123  BILITOT 0.6 0.6 0.3  PROT 5.2* 5.4* 5.9*  ALBUMIN 2.7* 2.8* 2.9*   Recent Labs    02/09/23 1135  LIPASE 88*   No results for input(s): "AMMONIA" in the last 8760 hours. CBC: Recent Labs    02/23/23 0034 02/24/23 0324 02/25/23 0520  WBC 9.0 8.7 8.7  HGB 11.3* 11.1* 12.0  HCT 34.0* 34.5* 36.2  MCV 100.9* 102.4* 100.0  PLT 353 379 404*   Cardiac Enzymes: No results for input(s): "CKTOTAL", "CKMB", "CKMBINDEX", "TROPONINI" in the last 8760 hours. BNP: Invalid input(s): "POCBNP" CBG: Recent Labs    02/21/23 0519 02/21/23 0549 02/21/23 0716  GLUCAP 127* 103* 113*    Procedures and Imaging Studies During Stay: DG Abd Portable 1V Result Date: 02/21/2023 CLINICAL DATA:  Abdominal pain.  Ileus. EXAM: PORTABLE ABDOMEN - 1 VIEW COMPARISON:  Abdominal x-ray from yesterday. FINDINGS: Unchanged enteric tube within the stomach. Normal bowel gas pattern. No acute osseous  abnormality. Prior cholecystectomy. IMPRESSION: 1. No acute findings. Electronically Signed   By: Obie Dredge M.D.   On: 02/21/2023 10:39   DG Abd Portable 1V Result Date: 02/20/2023 CLINICAL DATA:  NG tube placement EXAM: PORTABLE ABDOMEN - 1 VIEW COMPARISON:  02/16/2023 FINDINGS: NG tube is in the fundus of the stomach. Visualized lungs clear. Aortic atherosclerosis. Prior cholecystectomy. IMPRESSION: NG tube in the stomach. Electronically Signed   By: Charlett Nose M.D.   On: 02/20/2023 21:27   DG Abd Portable 1V Result Date: 02/16/2023 CLINICAL DATA:  Follow-up small bowel obstruction. EXAM: PORTABLE ABDOMEN - 1 VIEW COMPARISON:  02/16/2023 FINDINGS: Enteric tube tip and side port are below the GE junction. Surgical clips noted in the right upper quadrant. Dilated small bowel loops within the central abdomen measure up to 3.4 cm on the current exam. Previously these measured up to 4.8 cm. Enteric contrast material is identified within the proximal ascending colon and descending colon. IMPRESSION: Improving small bowel obstruction. Enteric contrast material is identified within the proximal ascending colon and descending colon. Electronically Signed  By: Signa Kell M.D.   On: 02/16/2023 10:45   [place] EKG SITE RITE Result Date: 02/16/2023 If Site Rite image not attached, placement could not be confirmed due to current cardiac rhythm.  DG Chest Port 1 View Result Date: 02/16/2023 CLINICAL DATA:  89 year old female status post nasogastric tube placement. EXAM: PORTABLE CHEST 1 VIEW COMPARISON:  Chest x-ray 01/19/2016. FINDINGS: Nasogastric tube extends into the proximal stomach with side port just distal to the gastroesophageal junction. Lung volumes are low. No consolidative airspace disease. No pleural effusions. No pneumothorax. No pulmonary nodule or mass noted. Pulmonary vasculature and the cardiomediastinal silhouette are within normal limits. Atherosclerosis in the thoracic aorta. Surgical clips  project over the right upper quadrant of the abdomen, likely from prior cholecystectomy. Gaseous distention is noted in the upper abdomen, including what appear to be dilated loops of small bowel, similar to prior studies. IMPRESSION: 1. Support apparatus, as above. 2. Low lung volumes without radiographic evidence of acute cardiopulmonary disease. 3. Aortic atherosclerosis. 4. Dilated loops of small bowel in the upper abdomen suggestive of underlying small bowel obstruction, similar to prior abdominal radiographs. Electronically Signed   By: Trudie Reed M.D.   On: 02/16/2023 05:43   DG Abd Portable 1V Result Date: 02/15/2023 CLINICAL DATA:  Small bowel obstruction EXAM: PORTABLE ABDOMEN - 1 VIEW COMPARISON:  Yesterday FINDINGS: Enteric tube tip and side port at the stomach. Continued dilatation of small bowel with gas filling. Some contrast has reached the proximal colon. IMPRESSION: Some contrast has reached the colon but there is ongoing small bowel obstruction with unchanged diffuse gaseous distension of small bowel. Electronically Signed   By: Tiburcio Pea M.D.   On: 02/15/2023 08:04   DG Abd Portable 1V Result Date: 02/14/2023 CLINICAL DATA:  Small-bowel obstruction EXAM: PORTABLE ABDOMEN - 1 VIEW COMPARISON:  02/14/2023 FINDINGS: Supine frontal view of the abdomen and pelvis excludes the right hemidiaphragm by collimation. Enteric catheter tip within the gastric fundus. Oral contrast remains within the stomach. Minimal dilute contrast is seen within dilated loops of small bowel, consistent with small-bowel obstruction. There is no oral contrast seen within the expected region of the colon. IMPRESSION: 1. Minimal progression of oral contrast, with dilute contrast seen within dilated loops of small bowel throughout the abdomen, consistent with small-bowel obstruction. No transit of contrast into the colon by the time of imaging. Electronically Signed   By: Sharlet Salina M.D.   On: 02/14/2023 21:51    DG Abd Portable 1V-Small Bowel Obstruction Protocol-initial, 8 hr delay Result Date: 02/14/2023 CLINICAL DATA:  Follow-up exam for small bowel obstruction. EXAM: PORTABLE ABDOMEN - 1 VIEW COMPARISON:  02/13/2023 FINDINGS: Enteric tube tip is scratch set enteric tube is again noted with tip and side port in the stomach. Enteric contrast material opacifies the gastric fundus. Only a small amount of dilute enteric contrast material is noted in the expected location of the proximal duodenum. No significant antegrade progression of the contrast material into the small bowel noted. Persistent dilated small bowel loops are identified measuring up to 3.7 cm. This is unchanged compared with the previous exam. IMPRESSION: 1. Persistent dilated small bowel loops compatible with small bowel obstruction. 2. Enteric contrast is identified within the gastric fundus. Just a small amount of dilute contrast is noted in the expected location of the proximal duodenum. Electronically Signed   By: Signa Kell M.D.   On: 02/14/2023 11:25   DG Abd Portable 1V-Small Bowel Protocol-Position Verification Result Date: 02/13/2023 CLINICAL  DATA:  Check gastric catheter placement EXAM: PORTABLE ABDOMEN - 1 VIEW COMPARISON:  None Available. FINDINGS: Gastric catheter is noted within the stomach. Contrast is noted from recent CT examination. Small-bowel dilatation is seen similar to that noted on recent CT examination. No free air is noted. IMPRESSION: Gastric catheter within the stomach. Persistent small bowel dilatation. Electronically Signed   By: Alcide Clever M.D.   On: 02/13/2023 20:24   CT Angio Abd/Pel w/ and/or w/o Result Date: 02/13/2023 CLINICAL DATA:  Mesenteric ischemia. EXAM: CTA ABDOMEN AND PELVIS WITHOUT AND WITH CONTRAST TECHNIQUE: Multidetector CT imaging of the abdomen and pelvis was performed using the standard protocol during bolus administration of intravenous contrast. Multiplanar reconstructed images and MIPs were  obtained and reviewed to evaluate the vascular anatomy. RADIATION DOSE REDUCTION: This exam was performed according to the departmental dose-optimization program which includes automated exposure control, adjustment of the mA and/or kV according to patient size and/or use of iterative reconstruction technique. CONTRAST:  OMNIPAQUE IOHEXOL 350 MG/ML SOLN COMPARISON:  Abdomen and pelvis CT 02/09/2023 FINDINGS: VASCULAR Aorta: Normal caliber aorta without aneurysm, dissection, vasculitis or significant stenosis. Moderate atherosclerotic disease. Celiac: Patent without evidence of aneurysm, dissection, vasculitis or significant stenosis. SMA: Patent without evidence of aneurysm, dissection, vasculitis or significant stenosis. Renals: Both renal arteries are patent without evidence of aneurysm, dissection, vasculitis, fibromuscular dysplasia or significant stenosis. IMA: Patent without evidence of aneurysm, dissection, vasculitis or significant stenosis. Inflow: Patent without evidence of aneurysm, dissection, vasculitis or significant stenosis. Proximal Outflow: Bilateral common femoral and visualized portions of the superficial and profunda femoral arteries are patent without evidence of aneurysm, dissection, vasculitis or significant stenosis. Veins: Portal vein and superior mesenteric vein are patent. No discernible venous thrombosis within the small bowel mesentery Review of the MIP images confirms the above findings. NON-VASCULAR Lower chest: Tiny bilateral pleural effusions with dependent atelectasis. Hepatobiliary: No suspicious focal abnormality within the liver parenchyma. Small area of low attenuation in the anterior liver, adjacent to the falciform ligament, is in a characteristic location for focal fatty deposition. Gallbladder is surgically absent. No intrahepatic or extrahepatic biliary dilation. Pancreas: No focal mass lesion. No dilatation of the main duct. No intraparenchymal cyst. No  peripancreatic edema. Spleen: No splenomegaly. No suspicious focal mass lesion. Adrenals/Urinary Tract: No adrenal nodule or mass. Tiny well-defined homogeneous low-density lesions in both kidneys are too small to characterize but are statistically most likely benign and probably cysts. No followup imaging is recommended. No evidence for hydroureter. The urinary bladder appears normal for the degree of distention. Stomach/Bowel: Stomach is unremarkable. No gastric wall thickening. No evidence of outlet obstruction. Duodenum is normally positioned as is the ligament of Treitz. 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 On today's exam, there is a fluid collection in the region of the diverticulum with multiple tiny gas bubbles associated. Coronal image 72/14 raises the question of a contained fluid collection, but there is no duodenal wall thickening in these changes are probably all related to the patient's known diverticulum. Small bowel loops have become more diffusely distended in the interval measuring up to 3.2 cm diameter. No discernible small bowel wall thickening and no areas of small-bowel mural non-enhancement are evident. There is no small bowel pneumatosis. Patient is noted to have a completely decompressed distal and terminal ileum with an apparent abrupt transition zone in the right pelvis (see axial 136/5 and coronal 48/14). The appendix is not visible compatible with reported history of appendectomy. No gross colonic  mass. No colonic wall thickening. Diverticuli are seen scattered along the entire length of the colon without CT findings of diverticulitis. Lymphatic: There is no gastrohepatic or hepatoduodenal ligament lymphadenopathy. No retroperitoneal or mesenteric lymphadenopathy. No pelvic sidewall lymphadenopathy. Reproductive: Hysterectomy.  There is no adnexal mass. Other: Increasing free fluid is seen in the abdomen and pelvis with mesenteric edema/congestion also  progressive since prior. Musculoskeletal: Tiny umbilical hernia contains fat and trace fluid. Gas bubbles in the subcutaneous fat of the right lower anterior abdominal wall likely related to an injection site. No worrisome lytic or sclerotic osseous abnormality. IMPRESSION: 1. No definite CT evidence for mesenteric ischemia. Mesenteric arterial anatomy is patent. No evidence for filling defect within the central mesenteric venous anatomy. No small bowel wall thickening. No small bowel pneumatosis. No areas of small-bowel wall non-enhancement are identified. No intraperitoneal free gas. 2. Small bowel loops have become more diffusely distended in the interval measuring up to 3.2 cm diameter. Patient is noted to have a completely decompressed distal and terminal ileum with an apparent abrupt transition zone in the right pelvis. This abrupt transition zone corresponds to the abnormality identified on the CT scan from February 10, 2023, but appears more pronounced on today's exam likely due to the increased bowel distension. Imaging features raise concern for small-bowel obstruction, potentially secondary to an adhesion. Internal hernia/closed loop obstruction is not excluded. 3. Increasing free fluid in the abdomen and pelvis with mesenteric edema/congestion also progressive since prior. 4. 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 On today's exam, there is a fluid collection in the region of the diverticulum with multiple tiny gas bubbles associated. The anatomy of the diverticulum is not well demonstrated on today's study and these findings are likely related to the duodenal diverticulum. Contain retroperitoneal duodenal perforation is considered less likely but not entirely excluded. 5. Tiny bilateral pleural effusions with dependent atelectasis. 6.  Aortic Atherosclerosis (ICD10-I70.0). I discussed these findings by telephone with Dr. Sunnie Nielsen at approximately 1425 hours on 02/13/2023.  Electronically Signed   By: Kennith Center M.D.   On: 02/13/2023 14:25   CT ABDOMEN PELVIS W CONTRAST Result Date: Feb 10, 2023 CLINICAL DATA:  Acute abdominal pain, nonlocalized EXAM: CT ABDOMEN AND PELVIS WITH CONTRAST TECHNIQUE: Multidetector CT imaging of the abdomen and pelvis was performed using the standard protocol following bolus administration of intravenous contrast. RADIATION DOSE REDUCTION: This exam was performed according to the departmental dose-optimization program which includes automated exposure control, adjustment of the mA and/or kV according to patient size and/or use of iterative reconstruction technique. CONTRAST:  OMNIPAQUE IOHEXOL 300 MG/ML  SOLN COMPARISON:  CT 02/10/2020 FINDINGS: Lower chest: No pleural or pericardial effusion. Scattered coronary and aortic calcifications. Coarse subpleural linear scarring or atelectasis in cystic change posteriorly in the lung bases. Hepatobiliary: No focal liver abnormality is seen. Status post cholecystectomy. No biliary dilatation. Pancreas: Unremarkable. No pancreatic ductal dilatation or surrounding inflammatory changes. Spleen: Normal in size without focal abnormality. Adrenals/Urinary Tract: No adrenal mass. Symmetric renal enhancement without urolithiasis or hydronephrosis. Multiple cortical lesions in both kidneys, some of which can be characterized as cysts, largest 1.1 cm 21 HU left upper pole; no followup recommended. Urinary bladder is distended. Stomach/Bowel: Stomach is partially distended, without acute finding. Fluid distended small bowel loops in the left lower abdomen. Transition zone in the right lower quadrant with a short loop of small bowel showing some circumferential wall thickening, and relatively decreased mucosal enhancement, with regional inflammatory/edematous changes in the mesentery (Im26-29,Se82) . post  appendectomy. The colon is partially distended, with multiple descending and sigmoid diverticula; no adjacent  inflammatory change. Vascular/Lymphatic: Heavy calcified aortoiliac atheromatous plaque without aneurysm. Portal vein patent. No abdominal or pelvic adenopathy. Reproductive: Status post hysterectomy. No adnexal masses. Other: Small amount of free fluid in the pelvis. Small volume right lower quadrant ascites adjacent to the abnormal loop of bowel as above. No upper abdominal ascites. No free air. Musculoskeletal: Thoracolumbar lower levoscoliosis apex L2 with multilevel lumbar spondylitic change. Grade 1 anterolisthesis L5-S1 attributed to advanced facet DJD. IMPRESSION: 1. Short segment of abnormal small bowel in the right lower quadrant with regional inflammatory/edematous changes in the mesentery and small volume ascites. Appearance is nonspecific, considerations include localized infectious, inflammatory, or ischemic enteritis. 2. Descending and sigmoid diverticulosis. 3.  Aortic Atherosclerosis (ICD10-I70.0). Electronically Signed   By: Corlis Leak M.D.   On: 02/09/2023 16:24    Assessment/Plan:    1. SBO (small bowel obstruction) (HCC) (Primary)  Pt doing better Incision healed well Instructed to follow up with gen surgery   Patient is being discharged with the following home health services:   Follow-up Information       Geoffry Paradise, MD Follow up in 1 week(s).   Specialty: Internal Medicine Contact information: [address] [place] [address] [phone]              Berna Bue, MD Follow up on 03/21/2023.   Specialty: General Surgery Why: follow up appt 2/6 at 9 am Contact information: [address] [place] [address] [phone]    Patient is being discharged with the following durable medical equipment:    Patient has been advised to f/u with their PCP in 1-2 weeks to for a transitions of care visit.  Social services at their facility was responsible for arranging this appointment.     35 min  Total time spent for obtaining history,   performing a medically appropriate examination and evaluation,  documenting clinical information in the electronic or other health record, preparing discharge and care coordination  (not separately reported)

## 2023-03-20 ENCOUNTER — Encounter: Payer: Self-pay | Admitting: Nurse Practitioner

## 2023-03-20 NOTE — Progress Notes (Signed)
 This encounter was created in error - please disregard.

## 2023-04-01 ENCOUNTER — Other Ambulatory Visit (HOSPITAL_COMMUNITY): Payer: Medicare Other

## 2023-04-01 ENCOUNTER — Ambulatory Visit: Payer: Medicare Other

## 2023-04-05 ENCOUNTER — Other Ambulatory Visit: Payer: Self-pay | Admitting: *Deleted

## 2023-04-05 DIAGNOSIS — I714 Abdominal aortic aneurysm, without rupture, unspecified: Secondary | ICD-10-CM

## 2023-04-08 ENCOUNTER — Ambulatory Visit
Admission: RE | Admit: 2023-04-08 | Discharge: 2023-04-08 | Disposition: A | Discharge status: Home / Self Care | Payer: Medicare Other | Source: Ambulatory Visit | Attending: Hematology and Oncology | Admitting: Hematology and Oncology

## 2023-04-08 DIAGNOSIS — Z170 Estrogen receptor positive status [ER+]: Secondary | ICD-10-CM

## 2023-04-15 ENCOUNTER — Ambulatory Visit: Payer: Medicare Other | Admitting: Physician Assistant

## 2023-04-15 ENCOUNTER — Ambulatory Visit (HOSPITAL_COMMUNITY)
Admission: RE | Admit: 2023-04-15 | Discharge: 2023-04-15 | Disposition: A | Discharge status: Home / Self Care | Payer: Medicare Other | Source: Ambulatory Visit | Attending: Surgery | Admitting: Surgery

## 2023-04-15 VITALS — BP 139/71 | HR 52 | Temp 97.3°F | Ht 63.0 in | Wt 117.1 lb

## 2023-04-15 DIAGNOSIS — I714 Abdominal aortic aneurysm, without rupture, unspecified: Secondary | ICD-10-CM | POA: Diagnosis present

## 2023-04-15 NOTE — Progress Notes (Signed)
 Office Note   History of Present Illness   Candice Brown is a 89 y.o. (30-Oct-1931) female who presents for follow up of AAA.  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  This was detected in 2019 during a workup for lower back pain.   She returned today for follow-up.  She says that she feels pretty good today.  She is making good recovery after recent exploratory laparotomy with lysis of adhesions for a small bowel obstruction.  She endorses some abdominal soreness related to her incision, but no other abdominal pain. She denies any back pain.    Current Outpatient Medications  Medication Sig Dispense Refill   anastrozole (ARIMIDEX) 1 MG tablet Take 1 tablet (1 mg total) by mouth daily. 90 tablet 3   Aspirin-Caffeine 500-32.5 MG TABS Take 1 tablet by mouth as needed (pain).     Methylcellulose, Laxative, (CITRUCEL PO) Take 1 tablet by mouth daily.     Multiple Vitamin (MULTI VITAMIN) TABS Take 1 tablet by mouth daily. Senior woman     NON FORMULARY Take 2 tablets by mouth at bedtime. Hyland leg cramp pills     pantoprazole (PROTONIX) 40 MG tablet Take 40 mg by mouth daily.      polycarbophil (FIBERCON) 625 MG tablet Take 1,250 mg by mouth at bedtime.     Polyvinyl Alcohol-Povidone (REFRESH OP) Place 1 drop into both eyes daily as needed (Dry eye).     pregabalin (LYRICA) 150 MG capsule Take 150 mg by mouth daily.     Probiotic Product (ALIGN PO) Take 1 tablet by mouth daily.     rosuvastatin (CRESTOR) 10 MG tablet Take 10 mg by mouth 2 (two) times a week.  0   nicotine polacrilex (NICORETTE) 4 MG gum Take 4 mg by mouth as needed for smoking cessation.     No current facility-administered medications for this visit.    REVIEW OF SYSTEMS (negative unless checked):   Cardiac:  []  Chest pain or chest pressure? []  Shortness of breath upon activity? []  Shortness of breath when lying flat? []  Irregular heart rhythm?  Vascular:  []  Pain in calf, thigh, or hip brought on by  walking? []  Pain in feet at night that wakes you up from your sleep? []  Blood clot in your veins? []  Leg swelling?  Pulmonary:  []  Oxygen at home? []  Productive cough? []  Wheezing?  Neurologic:  []  Sudden weakness in arms or legs? []  Sudden numbness in arms or legs? []  Sudden onset of difficult speaking or slurred speech? []  Temporary loss of vision in one eye? []  Problems with dizziness?  Gastrointestinal:  []  Blood in stool? []  Vomited blood?  Genitourinary:  []  Burning when urinating? []  Blood in urine?  Psychiatric:  []  Major depression  Hematologic:  []  Bleeding problems? []  Problems with blood clotting?  Dermatologic:  []  Rashes or ulcers?  Constitutional:  []  Fever or chills?  Ear/Nose/Throat:  []  Change in hearing? []  Nose bleeds? []  Sore throat?  Musculoskeletal:  []  Back pain? []  Joint pain? []  Muscle pain?   Physical Examination   Vitals:   04/15/23 1028  BP: 139/71  Pulse: (!) 52  Temp: (!) 97.3 F (36.3 C)  TempSrc: Temporal  SpO2: 92%  Weight: 117 lb 1.6 oz (53.1 kg)  Height: 5\' 3"  (1.6 m)   Body mass index is 20.74 kg/m.  General:  WDWN in NAD; vital signs documented above Gait: Not observed HENT: WNL, normocephalic Pulmonary: normal  non-labored breathing , without rales, rhonchi,  wheezing Cardiac: regular Abdomen: soft, nondistended, healing abdominal incision Skin: without rashes Vascular Exam/Pulses: BLE warm and well perfused Extremities: without ischemic changes, without gangrene , without cellulitis; without open wounds;  Musculoskeletal: no muscle wasting or atrophy  Neurologic: A&O X 3;  No focal weakness or paresthesias are detected Psychiatric:  The pt has Normal affect.   Non-Invasive Vascular Imaging   AAA Duplex (04/15/2023) Current size: 2.76 cm Previous size: 2.8 cm (2022) R CIA: 1 cm L CIA: 1 cm   Medical Decision Making   Candice Brown is a 89 y.o. (09-Dec-1931) female who presents for surveillance  of AAA  Based on this patient's duplex, her AAA is stable in size since her last visit.  Her maximum diameter today is 2.76 cm She does have some abdominal soreness today, related to her recent exploratory laparotomy.  She denies any other abdominal pain.  She also denies any chest or back pain The threshold for repair is AAA size > 5.5 cm, growth > 1 cm/yr, and symptomatic status. She can follow up with our office in 3 years with repeat AAA duplex   Loel Dubonnet PA-C [facility] Office: [phone]  Clinic MD: Myra Gianotti

## 2023-05-28 ENCOUNTER — Telehealth: Payer: Self-pay | Admitting: Hematology and Oncology

## 2023-05-28 NOTE — Telephone Encounter (Signed)
 Called to rescheduled appt, no answer, no voicemail. Appt update sent to MyChart

## 2023-06-27 ENCOUNTER — Ambulatory Visit: Payer: Medicare Other | Admitting: Hematology and Oncology

## 2023-06-28 ENCOUNTER — Inpatient Hospital Stay: Attending: Adult Health | Admitting: Adult Health

## 2023-06-28 ENCOUNTER — Telehealth: Payer: Self-pay | Admitting: *Deleted

## 2023-06-28 NOTE — Telephone Encounter (Signed)
 Called pt to f/u about missing appt today. Pt stated that she wasn't aware and had other health dilemmas recently and apologized. Advised pt that scheduling will be sent to reschedule. Pt verbalized understanding

## 2023-07-30 ENCOUNTER — Encounter: Payer: Self-pay | Admitting: Internal Medicine

## 2023-07-30 ENCOUNTER — Ambulatory Visit: Admitting: Internal Medicine

## 2023-07-30 VITALS — BP 120/60 | HR 70 | Ht 63.0 in | Wt 119.0 lb

## 2023-07-30 DIAGNOSIS — K56609 Unspecified intestinal obstruction, unspecified as to partial versus complete obstruction: Secondary | ICD-10-CM

## 2023-07-30 DIAGNOSIS — R1310 Dysphagia, unspecified: Secondary | ICD-10-CM | POA: Diagnosis not present

## 2023-07-30 DIAGNOSIS — K219 Gastro-esophageal reflux disease without esophagitis: Secondary | ICD-10-CM

## 2023-07-30 DIAGNOSIS — Z8719 Personal history of other diseases of the digestive system: Secondary | ICD-10-CM

## 2023-07-30 NOTE — Progress Notes (Signed)
 HISTORY OF PRESENT ILLNESS:  Candice Brown is a 89 y.o. female, [facility] resident, with past medical history as listed below.  She presents herself today regarding choking spells.  I have seen the patient previously for GERD and irregular bowel habits.  Colonoscopy: October 24, 2020 to evaluate bowel irregularity and incontinence.  FINDINGS: Diverticulosis, melanosis, and diminutive adenomatous polyps.  Random colon biopsies negative for microscopic colitis. Upper endoscopy March 17, 2014 to evaluate nausea and epigastric pain.  Incidental distal esophageal ring and hiatal hernia.  Placed on pantoprazole   The patient was last seen December 06, 2020 after her colonoscopy.  Symptoms were improved on fiber.  She has not been seen since.  She did however have a small bowel obstruction for which she underwent surgery January 2025.  Slow but steady recovery.  I have reviewed her hospitalization, laboratories, and CT imaging.  She reports to me a several year history of choking spells when swallowing saliva or occasionally liquids.  I cannot get a history of solid food dysphagia.  In mid March she underwent a fiberoptic laryngoscopy and was diagnosed with pharyngeal dysphagia.  They did suggest alterations in the way she ate.  Since that time she reports that she is only had 1 episode of a choking spell.  At some point there was a recommendation to see a gastroenterologist.  She is here.  GI review of systems is unremarkable for new issues or problems.  REVIEW OF SYSTEMS:  All non-GI ROS negative except for arthritis, back pain, muscle cramps  Past Medical History:  Diagnosis Date   Anemia    Arthritis    Colon polyps    hyperplastic   Complication of anesthesia    hard to wake up   Depression    Diverticulosis    GERD (gastroesophageal reflux disease)    Hiatal hernia    Hyperlipidemia    IBS (irritable bowel syndrome)    Internal hemorrhoids    Lung nodule    Occipital  neuralgia    Peripheral neuropathy     Past Surgical History:  Procedure Laterality Date   ABDOMINAL HYSTERECTOMY  1972   APPENDECTOMY  1959   BOWEL RESECTION N/A 02/15/2023   Procedure: LYSIS OF ADHESIONS;  Surgeon: Adalberto Acton, MD;  Location: [facility];  Service: General;  Laterality: N/A;   BREAST LUMPECTOMY Right 07/18/2021   BREAST LUMPECTOMY WITH RADIOACTIVE SEED LOCALIZATION Right 07/18/2021   Procedure: RIGHT BREAST LUMPECTOMY WITH RADIOACTIVE SEED LOCALIZATION;  Surgeon: Lockie Rima, MD;  Location: [facility];  Service: General;  Laterality: Right;   CATARACT EXTRACTION Bilateral    left 03/2014, right eye 06/2016   CHOLECYSTECTOMY     LAPAROTOMY N/A 02/15/2023   Procedure: EXPLORATORY LAPAROTOMY;  Surgeon: Adalberto Acton, MD;  Location: [facility];  Service: General;  Laterality: N/A;    Social History Candice Brown  reports that she has been smoking cigarettes. She has never used smokeless tobacco. She reports current alcohol  use. She reports that she does not use drugs.  family history includes Breast cancer (age of onset: 33) in her mother; CVA in her father; Heart attack in her paternal grandfather; Heart attack (age of onset: 1) in her father; Heart disease in her father.  Allergies  Allergen Reactions   Linzess [Linaclotide] Diarrhea    Increased leg cramps   Oxycodone Nausea Only     ( made very sick)   Simvastatin Other (See Comments)    Leg cramps  Benzalkonium Chloride Rash    In neosporin   Neosporin [Neomycin-Polymyxin-Gramicidin] Rash   Tape Rash    Adhesive patches of any kind       PHYSICAL EXAMINATION: Vital signs: BP 120/60   Pulse 70   Ht 5' 3 (1.6 m)   Wt 119 lb (54 kg)   BMI 21.08 kg/m   Constitutional: generally well-appearing, no acute distress Psychiatric: alert and oriented x3, cooperative Eyes: extraocular movements intact, anicteric, conjunctiva pink Mouth: oral pharynx moist, no lesions Neck: supple no  lymphadenopathy Cardiovascular: heart regular rate and rhythm, no murmur Lungs: clear to auscultation bilaterally Abdomen: soft, nontender, nondistended, no obvious ascites, no peritoneal signs, normal bowel sounds, no organomegaly Rectal: Omitted Extremities: no clubbing, cyanosis, or lower extremity edema bilaterally Skin: no lesions on visible extremities Neuro: No focal deficits.  Cranial intact  ASSESSMENT:  1.  Pharyngeal dysphagia with associated occasional aspiration leading to coughing spells 2.  GERD with a history of large caliber distal ring.  I believe this is asymptomatic 3.  History of loose stools with incontinence.  Treated with fiber.  Colonoscopy as above 4.  History of small bowel obstruction status postresection January 2020   PLAN:  1.  Discussion on pharyngeal dysphagia 2.  Continue PPI for GERD 3.  Discussed chin tuck maneuver 4.  Schedule barium swallow to rule out gross abnormalities.  We will contact patient after the results are available.  If no gross abnormalities, follow-up as needed A total time of 40 minutes was spent preparing to see the patient, obtaining comprehensive history, performing medically appropriate physical examination, counseling and educating the patient regarding above listed issues, ordering radiology study, and documenting clinical information in the health record

## 2023-07-30 NOTE — Patient Instructions (Signed)
 You have been scheduled for a Barium Esophogram at [facility] (1st floor of the hospital) on 08/06/2023 at 9:00am. Please arrive 15 minutes prior to your appointment for registration. Make certain not to have anything to eat or drink 3 hours prior to your test. If you need to reschedule for any reason, please contact radiology at [phone] to do so. __________________________________________________________________ A barium swallow is an examination that concentrates on views of the esophagus. This tends to be a double contrast exam (barium and two liquids which, when combined, create a gas to distend the wall of the oesophagus) or single contrast (non-ionic iodine based). The study is usually tailored to your symptoms so a good history is essential. Attention is paid during the study to the form, structure and configuration of the esophagus, looking for functional disorders (such as aspiration, dysphagia, achalasia, motility and reflux) EXAMINATION You may be asked to change into a gown, depending on the type of swallow being performed. A radiologist and radiographer will perform the procedure. The radiologist will advise you of the type of contrast selected for your procedure and direct you during the exam. You will be asked to stand, sit or lie in several different positions and to hold a small amount of fluid in your mouth before being asked to swallow while the imaging is performed .In some instances you may be asked to swallow barium coated marshmallows to assess the motility of a solid food bolus. The exam can be recorded as a digital or video fluoroscopy procedure. POST PROCEDURE It will take 1-2 days for the barium to pass through your system. To facilitate this, it is important, unless otherwise directed, to increase your fluids for the next 24-48hrs and to resume your normal diet.  This test typically takes about 30 minutes to  perform. __________________________________________________________________________________

## 2023-08-06 ENCOUNTER — Ambulatory Visit (HOSPITAL_COMMUNITY)
Admission: RE | Admit: 2023-08-06 | Discharge: 2023-08-06 | Disposition: A | Discharge status: Home / Self Care | Source: Ambulatory Visit | Attending: Internal Medicine | Admitting: Internal Medicine

## 2023-08-06 ENCOUNTER — Ambulatory Visit: Payer: Self-pay | Admitting: Internal Medicine

## 2023-08-06 DIAGNOSIS — R1310 Dysphagia, unspecified: Secondary | ICD-10-CM | POA: Insufficient documentation

## 2023-10-08 ENCOUNTER — Other Ambulatory Visit: Payer: Self-pay | Admitting: Hematology and Oncology

## 2024-02-21 ENCOUNTER — Emergency Department (HOSPITAL_BASED_OUTPATIENT_CLINIC_OR_DEPARTMENT_OTHER)

## 2024-02-21 ENCOUNTER — Other Ambulatory Visit (HOSPITAL_BASED_OUTPATIENT_CLINIC_OR_DEPARTMENT_OTHER): Payer: Self-pay

## 2024-02-21 ENCOUNTER — Other Ambulatory Visit: Payer: Self-pay

## 2024-02-21 ENCOUNTER — Emergency Department (HOSPITAL_BASED_OUTPATIENT_CLINIC_OR_DEPARTMENT_OTHER)
Admission: EM | Admit: 2024-02-21 | Discharge: 2024-02-21 | Disposition: A | Discharge status: Home / Self Care | Attending: Emergency Medicine | Admitting: Emergency Medicine

## 2024-02-21 DIAGNOSIS — I82432 Acute embolism and thrombosis of left popliteal vein: Secondary | ICD-10-CM | POA: Insufficient documentation

## 2024-02-21 DIAGNOSIS — M7989 Other specified soft tissue disorders: Secondary | ICD-10-CM | POA: Diagnosis present

## 2024-02-21 LAB — CBC WITH DIFFERENTIAL/PLATELET
Abs Immature Granulocytes: 0.01 K/uL (ref 0.00–0.07)
Basophils Absolute: 0 K/uL (ref 0.0–0.1)
Basophils Relative: 1 %
Eosinophils Absolute: 0.2 K/uL (ref 0.0–0.5)
Eosinophils Relative: 3 %
HCT: 35.9 % — ABNORMAL LOW (ref 36.0–46.0)
Hemoglobin: 12 g/dL (ref 12.0–15.0)
Immature Granulocytes: 0 %
Lymphocytes Relative: 28 %
Lymphs Abs: 1.7 K/uL (ref 0.7–4.0)
MCH: 32.7 pg (ref 26.0–34.0)
MCHC: 33.4 g/dL (ref 30.0–36.0)
MCV: 97.8 fL (ref 80.0–100.0)
Monocytes Absolute: 0.5 K/uL (ref 0.1–1.0)
Monocytes Relative: 8 %
Neutro Abs: 3.6 K/uL (ref 1.7–7.7)
Neutrophils Relative %: 60 %
Platelets: 286 K/uL (ref 150–400)
RBC: 3.67 MIL/uL — ABNORMAL LOW (ref 3.87–5.11)
RDW: 12.6 % (ref 11.5–15.5)
WBC: 6.1 K/uL (ref 4.0–10.5)
nRBC: 0 % (ref 0.0–0.2)

## 2024-02-21 LAB — BASIC METABOLIC PANEL WITH GFR
Anion gap: 10 (ref 5–15)
BUN: 15 mg/dL (ref 8–23)
CO2: 25 mmol/L (ref 22–32)
Calcium: 9.6 mg/dL (ref 8.9–10.3)
Chloride: 105 mmol/L (ref 98–111)
Creatinine, Ser: 0.76 mg/dL (ref 0.44–1.00)
GFR, Estimated: >60 mL/min
Glucose, Bld: 100 mg/dL — ABNORMAL HIGH (ref 70–99)
Potassium: 4.2 mmol/L (ref 3.5–5.1)
Sodium: 140 mmol/L (ref 135–145)

## 2024-02-21 MED ORDER — APIXABAN 5 MG PO TABS
ORAL_TABLET | ORAL | 2 refills | Status: DC
  Filled 2024-02-21: qty 60, 30d supply, fill #0

## 2024-02-21 MED ORDER — APIXABAN 5 MG PO TABS
10.0000 mg | ORAL_TABLET | Freq: Once | ORAL | Status: AC
  Administered 2024-02-21: 10 mg via ORAL
  Filled 2024-02-21: qty 2

## 2024-02-21 MED ORDER — APIXABAN 5 MG PO TABS: ORAL_TABLET | ORAL | 2 refills | Status: DC

## 2024-02-21 NOTE — ED Triage Notes (Signed)
 Patient states both feet swollen starting yesterday. States swelling better today but PCP wanted her checked for blood clot. Tenderness to right calf upon touch.

## 2024-02-21 NOTE — Discharge Instructions (Addendum)
 Your ultrasound today showed that you have a blood clot in your left leg.  You are given your first dose of Eliquis  in the emergency department.  Additional prescription sent into the pharmacy for you.  Please start this tomorrow morning.  While you are on this medicine if you have any head injury please come into the emergency department for imaging to rule out brain bleed.  Follow-up with the DVT clinic.  Notify your oncologist about this.  Return for any concerning symptoms such as if you develop chest pain or shortness of breath as this could be a concern that the blood clot dislodged and went into your lungs.

## 2024-02-21 NOTE — ED Notes (Signed)

## 2024-02-21 NOTE — ED Notes (Signed)
 Ultrasound at bedside

## 2024-02-21 NOTE — ED Provider Notes (Signed)
 " [place] [facility] AT [facility] Provider Note   CSN: [id number] Arrival date & time: 02/21/24  1450     Patient presents with: Foot Swelling   Candice Brown is a 89 y.o. female.   89 year old female presents today for concern of swelling to both of her feet but worse on  the right since yesterday.  Has history of occasional swelling in bilateral lower extremities over the past couple months but this has been worse than previous episodes.  Has had some pain in the right calf.  No chest pain, no shortness of breath.  Not on any blood thinning medication.  No prior history of DVT or PE.  Is accompanied by her daughter who is at bedside.  The history is provided by the patient and a relative. No language interpreter was used.       Prior to Admission medications  Medication Sig Start Date End Date Taking? Authorizing Provider  anastrozole  (ARIMIDEX ) 1 MG tablet Take 1 tablet (1 mg total) by mouth daily. 10/08/23   Iruku, Praveena, MD  Aspirin-Caffeine 500-32.5 MG TABS Take 1 tablet by mouth as needed (pain). 07/11/16   [provider]  Methylcellulose, Laxative, (CITRUCEL PO) Take 1 tablet by mouth daily.    [provider]  Multiple Vitamin (MULTI VITAMIN) TABS Take 1 tablet by mouth daily. Senior woman    [provider]  nicotine polacrilex (NICORETTE) 4 MG gum Take 4 mg by mouth as needed for smoking cessation.    [provider]  NON FORMULARY Take 2 tablets by mouth at bedtime. Hyland leg cramp pills    [provider]  pantoprazole  (PROTONIX ) 40 MG tablet Take 40 mg by mouth daily.     [provider]  polycarbophil (FIBERCON) 625 MG tablet Take 1,250 mg by mouth at bedtime.    [provider]  Polyvinyl Alcohol -Povidone (REFRESH OP) Place 1 drop into both eyes daily as needed (Dry eye).    [provider]  pregabalin  (LYRICA ) 150 MG capsule Take 150 mg by mouth daily.    [provider]  Probiotic Product (ALIGN PO) Take 1 tablet by mouth daily.    [provider]  rosuvastatin  (CRESTOR ) 10 MG tablet Take 10 mg by mouth 2 (two) times a week. 06/28/15   [provider]    Allergies: Linzess [linaclotide], Oxycodone, Simvastatin, Benzalkonium chloride, Neosporin [neomycin-polymyxin-gramicidin], and Tape    Review of Systems  Constitutional:  Negative for chills and fever.  Respiratory:  Negative for shortness of breath.   Cardiovascular:  Positive for leg swelling. Negative for chest pain.  All other systems reviewed and are negative.   Updated Vital Signs BP (!) 157/73   Pulse 63   Temp 99.4 F (37.4 C) (Oral)   Resp 15   SpO2 95%   Physical Exam Vitals and nursing note reviewed.  Constitutional:      General: She is not in acute distress.    Appearance: Normal appearance. She is not ill-appearing.  HENT:     Head: Normocephalic and atraumatic.     Nose: Nose normal.  Eyes:     Conjunctiva/sclera: Conjunctivae normal.  Cardiovascular:     Rate and Rhythm: Normal rate.  Pulmonary:     Effort: Pulmonary effort is normal. No respiratory distress.  Musculoskeletal:        General: No deformity.     Right lower leg: No edema.     Left lower leg: No edema.  Skin:    Findings: No rash.  Neurological:     Mental Status: She is alert.     (all labs ordered are listed, but only abnormal results are displayed) Labs Reviewed  CBC WITH DIFFERENTIAL/PLATELET  BASIC METABOLIC PANEL WITH GFR    EKG: None  Radiology: No results found.   Procedures   Medications Ordered in the ED - No data to display                                  Medical Decision Making Amount and/or Complexity of Data Reviewed Labs: ordered.  Risk Prescription drug management.   Medical Decision Making / ED Course   This patient presents to the ED for concern of swelling, this involves an extensive number of treatment options, and is a  complaint that carries with it a high risk of complications and morbidity.  The differential diagnosis includes fracture, contusion, DVT  MDM: 89 year old female presents today for concern of bilateral lower extremity swelling and pain.  No prior history of DVT or PE.  Has been having intermittent swelling over the past couple months but this episode was worse.  She is not on any blood thinning medicine.  No chest pain or shortness of breath.  DVT study shows evidence of nonocclusive DVT in the  left popliteal vein.  No DVT on the right. She does have history of breast cancer and is currently on a estrogen pill.  After our conversation I do not see this listed in her medication list and I do not recall the exact name.  But I did advise her to discuss this with her oncologist.  I have given her a referral to the DVT clinic.  Risk-benefit discussion had regarding the Eliquis .  Specially the risk for brain bleed.  They voiced understanding and would like to proceed.  Patient is fairly active and still dances couple times a week.  Lives at independent living facility.  Patient discharged in stable condition.  Return precautions discussed.  Patient voices understanding and is in agreement with plan.  Patient   Additional history obtained: -Additional history obtained from daughter at bedside -External records from outside source obtained and reviewed including: Chart review including previous notes, labs, imaging, consultation notes   Lab Tests: -I ordered, reviewed, and interpreted labs.   The pertinent results include:   Labs Reviewed  CBC WITH DIFFERENTIAL/PLATELET - Abnormal; Notable for the following components:      Result Value   RBC 3.67 (*)    HCT 35.9 (*)    All other components within normal limits  BASIC METABOLIC PANEL WITH GFR - Abnormal; Notable for the following components:   Glucose, Bld 100 (*)    All other components within normal limits      EKG  EKG  Interpretation Date/Time:    Ventricular Rate:    PR Interval:    QRS Duration:    QT Interval:    QTC Calculation:   R Axis:      Text Interpretation:           Imaging Studies ordered: I ordered imaging studies including DVT study bilaterally I independently visualized and interpreted imaging. I agree with the radiologist interpretation   Medicines ordered and prescription drug management: Meds ordered this encounter  Medications   apixaban  (ELIQUIS ) tablet 10 mg   apixaban  (ELIQUIS ) 5 MG TABS tablet    Sig: Take 2  tablets (10mg ) twice daily for 7 days, then 1 tablet (5mg ) twice daily    Dispense:  60 tablet    Refill:  2    Supervising Provider:   CLEOTILDE ROGUE [[id number]]    -I have reviewed the patients home medicines and have made adjustments as needed  Social Determinants of Health:  Factors impacting patients care include: Good family support, good outpatient follow-up   Reevaluation: After the interventions noted above, I reevaluated the patient and found that they have :improved  Co morbidities that complicate the patient evaluation  Past Medical History:  Diagnosis Date   Anemia    Arthritis    Colon polyps    hyperplastic   Complication of anesthesia    hard to wake up   Depression    Diverticulosis    GERD (gastroesophageal reflux disease)    Hiatal hernia    Hyperlipidemia    IBS (irritable bowel syndrome)    Internal hemorrhoids    Lung nodule    Occipital neuralgia    Peripheral neuropathy       Dispostion: Discharged in stable condition.  Return precaution discussed.  Patient voices understanding and is in agreement with plan.  Final diagnoses:  Acute deep vein thrombosis (DVT) of popliteal vein of left lower extremity [facility])    ED Discharge Orders          Ordered    apixaban  (ELIQUIS ) 5 MG TABS tablet        02/21/24 1741    AMB Referral to Deep Vein Thrombosis Clinic        02/21/24 1741               Hildegard Loge,  PA-C 02/21/24 1805  "

## 2024-02-28 ENCOUNTER — Telehealth: Payer: Self-pay | Admitting: *Deleted

## 2024-02-28 NOTE — Telephone Encounter (Signed)
 Pt sent a letter asking if it was OK to take Eliquis  while on Anastrozole . Per Dr. Loretha, OK for pt to take medications. Called and left vm on pt secured line. Advised to call if there were any concerns.

## 2024-03-03 NOTE — Progress Notes (Unsigned)
 " DVT Clinic Note  Name: Candice Brown     MRN: [id number]     DOB: Mar 20, 1931     Sex: female  PCP: Shepard Ade, MD  Today's Visit: Visit Information: Initial Visit  Referred to DVT Clinic by: Emergency Department - Hildegard Loge, PA-C Referred to CPP by: Dr. Pearline Reason for referral:  Chief Complaint  Patient presents with   DVT   HISTORY OF PRESENT ILLNESS: Candice Brown is a 89 y.o. female who presents after diagnosis of DVT for medication management. Patient presented to ED on 02/21/24 with reported swelling in both of her feet and pain in right calf. History of bilateral swelling, but patient endorsed this episode of swelling is worse than before. Venous US  ordered and completed with evidence of nonocclusive thrombus in the distal left popliteal vein. Patient was started on Eliquis  upon discharge. Patient is ambulating with a walker and is accompanied by her daughter. Patient reports to be doing well. She has been tolerating Eliquis  and denies abnormal bleeding and bruising. Continues to be active with ballroom dancing. Reports that she feels heaviness in her legs and has to stop dancing during her class. She continues to feel tired and is unsure if Eliquis  is causing this. Otherwise, previous pain has resolved. Edema is still present in both legs which seems to be chronic. She has started wearing compression stockings to help with edema. Denies any recent travel, surgery, and acute illness. Reports having phlebitis when she was 89 years old and denies other DVTs. Patient is currently taking anastrozole  for history of breast cancer and wondering if her DVT could be related. Daughter confirms family history of Factor V Leiden in herself and patient's husband. Daughter does not think that patient has been tested for mutation.   Positive Thrombotic Risk Factors: Older Age, Previous VTE (Phlebitis at 89 years old) Bleeding Risk Factors: Age >65 years  Negative Thrombotic Risk Factors: Recent  surgery (within 3 months), Recent trauma (within 3 months), Recent admission to hospital with acute illness (within 3 months), Paralysis, paresis, or recent plaster cast immobilization of lower extremity, Central venous catheterization, Sedentary journey lasting >8 hours within 4 weeks, Pregnancy, Within 6 weeks postpartum, Bed rest >72 hours within 3 months, Recent cesarean section (within 3 months), Estrogen therapy, Testosterone therapy, Erythropoiesis-stimulating agent, Recent COVID diagnosis (within 3 months), Obesity, Active cancer, Non-malignant, chronic inflammatory condition, Known thrombophilic condition, Smoking  Rx Insurance Coverage: Medicare Rx Affordability: Eliquis : ~$249 for 30 day supply due to her deductible.  Rx Assistance Provided: No assistance needed at this time Preferred Pharmacy: [facility]  Past Medical History:  Diagnosis Date   Anemia    Arthritis    Colon polyps    hyperplastic   Complication of anesthesia    hard to wake up   Depression    Diverticulosis    GERD (gastroesophageal reflux disease)    Hiatal hernia    Hyperlipidemia    IBS (irritable bowel syndrome)    Internal hemorrhoids    Lung nodule    Occipital neuralgia    Peripheral neuropathy     Past Surgical History:  Procedure Laterality Date   ABDOMINAL HYSTERECTOMY  1972   APPENDECTOMY  1959   BOWEL RESECTION N/A 02/15/2023   Procedure: LYSIS OF ADHESIONS;  Surgeon: Signe Mitzie LABOR, MD;  Location: [facility];  Service: General;  Laterality: N/A;   BREAST LUMPECTOMY Right 07/18/2021   BREAST LUMPECTOMY WITH RADIOACTIVE SEED LOCALIZATION Right 07/18/2021   Procedure: RIGHT  BREAST LUMPECTOMY WITH RADIOACTIVE SEED LOCALIZATION;  Surgeon: Aron Shoulders, MD;  Location: [facility];  Service: General;  Laterality: Right;   CATARACT EXTRACTION Bilateral    left 03/2014, right eye 06/2016   CHOLECYSTECTOMY     LAPAROTOMY N/A 02/15/2023   Procedure: EXPLORATORY LAPAROTOMY;  Surgeon:  Signe Mitzie LABOR, MD;  Location: [facility];  Service: General;  Laterality: N/A;    Social History   Socioeconomic History   Marital status: Divorced    Spouse name: Not on file   Number of children: 3   Years of education: Masters   Highest education level: Not on file  Occupational History   Occupation: retired  Tobacco Use   Smoking status: Every Day    Current packs/day: 0.00    Average packs/day: 0.1 packs/day    Types: Cigarettes    Last attempt to quit: 10/13/2016    Years since quitting: 7.3   Smokeless tobacco: Never   Tobacco comments:    5 cigarettes per week.    12/31/18  nicotene gum, lozenges  Vaping Use   Vaping status: Never Used  Substance and Sexual Activity   Alcohol  use: Yes    Alcohol /week: 0.0 standard drinks of alcohol     Comment: less than 1/2 glass wine daily   Drug use: No   Sexual activity: Not on file  Other Topics Concern   Not on file  Social History Narrative   12/31/18 Lives alone   Caffeine- coffee, 1- 1 1/2 cups daily   Social Drivers of Health   Tobacco Use: High Risk (03/04/2024)   Patient History    Smoking Tobacco Use: Every Day    Smokeless Tobacco Use: Never    Passive Exposure: Not on file  Financial Resource Strain: Not on file  Food Insecurity: No Food Insecurity (02/16/2023)   Hunger Vital Sign    Worried About Running Out of Food in the Last Year: Never true    Ran Out of Food in the Last Year: Never true  Transportation Needs: No Transportation Needs (02/16/2023)   PRAPARE - Administrator, Civil Service (Medical): No    Lack of Transportation (Non-Medical): No  Physical Activity: Not on file  Stress: No Stress Concern Present (07/21/2021)   [employer] of Occupational Health - Occupational Stress Questionnaire    Feeling of Stress : Not at all  Social Connections: Socially Integrated (02/16/2023)   Social Connection and Isolation Panel    Frequency of Communication with Friends and Family: Three times a week     Frequency of Social Gatherings with Friends and Family: Three times a week    Attends Religious Services: 1 to 4 times per year    Active Member of Clubs or Organizations: Yes    Attends Banker Meetings: 1 to 4 times per year    Marital Status: Married  Catering Manager Violence: Not At Risk (02/16/2023)   Humiliation, Afraid, Rape, and Kick questionnaire    Fear of Current or Ex-Partner: No    Emotionally Abused: No    Physically Abused: No    Sexually Abused: No  Depression (PHQ2-9): Not on file  Alcohol  Screen: Not on file  Housing: Unknown (03/21/2023)   Received from [facility]   Epic    Unable to Pay for Housing in the Last Year: Not on file    Number of Times Moved in the Last Year: Not on file    At any time in  the past 12 months, were you homeless or living in a shelter (including now)?: No  Utilities: Not At Risk (02/16/2023)   [facility] Utilities    Threatened with loss of utilities: No  Health Literacy: Not on file    Family History  Problem Relation Age of Onset   Breast cancer Mother 28   Heart disease Father    CVA Father    Heart attack Father 62   Heart attack Paternal Grandfather    Colon cancer Neg Hx    Stomach cancer Neg Hx    Rectal cancer Neg Hx    Esophageal cancer Neg Hx     Allergies as of 03/04/2024 - Review Complete 03/04/2024  Allergen Reaction Noted   Linzess [linaclotide] Diarrhea 12/31/2018   Oxycodone Nausea Only 03/17/2014   Simvastatin Other (See Comments) 06/18/2014   Benzalkonium chloride Rash 06/10/2012   Neosporin [neomycin-polymyxin-gramicidin] Rash 06/01/2010   Tape Rash 06/14/2015    Medications Ordered Prior to Encounter[1] REVIEW OF SYSTEMS:  Review of Systems  Respiratory:  Negative for shortness of breath.   Cardiovascular:  Positive for leg swelling. Negative for chest pain.   PHYSICAL EXAMINATION:  Vitals:   03/04/24 1036  BP: 137/67  Pulse: 60  SpO2: 97%    There is no height or weight  on file to calculate BMI.  Physical Exam Vitals reviewed.  Pulmonary:     Effort: Pulmonary effort is normal.  Musculoskeletal:        General: Swelling present. No tenderness.     Right lower leg: Edema (2+) present.     Left lower leg: Edema (2+) present.    Villalta Score for Post-Thrombotic Syndrome: Pain: Absent Cramps: Absent Heaviness: Moderate Paresthesia: Absent Pruritus: Absent Pretibial Edema: Absent Skin Induration: Absent Hyperpigmentation: Absent Redness: Absent Venous Ectasia: Absent Pain on calf compression: Absent Villalta Preliminary Score: 2 Is venous ulcer present?: No If venous ulcer is present and score is <15, then 15 points total are assigned: Absent Villalta Total Score: 2  LABS:  CBC     Component Value Date/Time   WBC 6.1 02/21/2024 1628   RBC 3.67 (L) 02/21/2024 1628   HGB 12.0 02/21/2024 1628   HCT 35.9 (L) 02/21/2024 1628   PLT 286 02/21/2024 1628   MCV 97.8 02/21/2024 1628   MCH 32.7 02/21/2024 1628   MCHC 33.4 02/21/2024 1628   RDW 12.6 02/21/2024 1628   LYMPHSABS 1.7 02/21/2024 1628   MONOABS 0.5 02/21/2024 1628   EOSABS 0.2 02/21/2024 1628   BASOSABS 0.0 02/21/2024 1628    Hepatic Function      Component Value Date/Time   PROT 5.9 (L) 02/21/2023 0341   PROT 6.7 12/31/2018 1045   ALBUMIN  2.9 (L) 02/21/2023 0341   AST 86 (H) 02/21/2023 0341   ALT 94 (H) 02/21/2023 0341   ALKPHOS 123 02/21/2023 0341   BILITOT 0.3 02/21/2023 0341    Renal Function   Lab Results  Component Value Date   CREATININE 0.76 02/21/2024   CREATININE 0.81 02/25/2023   CREATININE 0.56 02/24/2023    CrCl cannot be calculated (Unknown ideal weight.).   [facility] Vascular Lab Studies:  02/21/24 ULTRASOUND DUPLEX OF THE BILATERAL LOWER EXTREMITY VEINS  FINDINGS: LEFT: The distal left popliteal vein is partially compressible with nonocclusive thrombus visualized peripherally. The common femoral vein, femoral vein, and visualized calf veins of the left lower  extremity demonstrate normal compressibility with normal color flow and spectral analysis.   RIGHT: No right lower extremity DVT identified. The  common femoral vein, femoral vein, popliteal vein, and visualized calf veins of the right lower extremity demonstrate normal compressibility with normal color flow and spectral analysis.   IMPRESSION: 1. Nonocclusive thrombus in the distal left popliteal vein. 2. No right lower extremity DVT identified.  ASSESSMENT: Location of DVT: Left popliteal vein  Patient with remote history of phlebitis who was recently diagnosed with nonocclusive DVT involving distal left popliteal vein. History of phlebitis at age 16 with no other history of DVT. Currently on anastrozole  for history of breast cancer. Family history of Factor V Leiden mutation, although patient has not been tested for mutation. No clear provoking factors identified. Patient was started on Eliquis  in the ED. Patient has been tolerating Eliquis  and symptoms have resolved. Given no clear provoking factors and pertinent history, will refer patient to hematology for further work up and management of anticoagulation treatment duration. She is already an established patient of Dr. Loretha. No barriers to medication adherence. Provided refills to patients preferred pharmacy. Extensively counseled on Eliquis . All patient's questions were answered. Patient confirmed understanding of plan.  PLAN: -Continue apixaban  (Eliquis ) 5 mg twice daily. -Expected duration of therapy: per hematology. Therapy started on 02/21/24. -Patient educated on purpose, proper use and potential adverse effects of apixaban  (Eliquis ). -Discussed importance of taking medication around the same time every day. -Advised patient of medications to avoid (NSAIDs, aspirin doses >100 mg daily). -Educated that Tylenol  (acetaminophen ) is the preferred analgesic to lower the risk of bleeding. -Advised patient to alert all providers of  anticoagulation therapy prior to starting a new medication or having a procedure. -Emphasized importance of monitoring for signs and symptoms of bleeding (abnormal bruising, prolonged bleeding, nose bleeds, bleeding from gums, discolored urine, black tarry stools). -Educated patient to present to the ED if emergent signs and symptoms of new thrombosis occur. -Counseled patient to wear compression stockings daily, removing at night.  Follow up: with hematology  Jenkins Graces, PharmD PGY1 Pharmacy Resident      [1]  Current Outpatient Medications on File Prior to Visit  Medication Sig Dispense Refill   anastrozole  (ARIMIDEX ) 1 MG tablet Take 1 tablet (1 mg total) by mouth daily. 90 tablet 3   Methylcellulose, Laxative, (CITRUCEL PO) Take 1 tablet by mouth daily. (Patient taking differently: Take 0.5 tablets by mouth every other day.)     Multiple Vitamin (MULTI VITAMIN) TABS Take 1 tablet by mouth daily. Senior woman     NON FORMULARY Take 2 tablets by mouth at bedtime. [place] leg cramp pills     pantoprazole  (PROTONIX ) 40 MG tablet Take 40 mg by mouth daily.      polycarbophil (FIBERCON) 625 MG tablet Take 1,250 mg by mouth at bedtime.     Polyvinyl Alcohol -Povidone (REFRESH OP) Place 1 drop into both eyes daily as needed (Dry eye).     pregabalin  (LYRICA ) 150 MG capsule Take 150 mg by mouth daily.     Probiotic Product (ALIGN PO) Take 1 tablet by mouth daily.     rosuvastatin  (CRESTOR ) 10 MG tablet Take 10 mg by mouth 2 (two) times a week.  0   No current facility-administered medications on file prior to visit.   "

## 2024-03-04 ENCOUNTER — Encounter: Payer: Self-pay | Admitting: Pharmacist

## 2024-03-04 ENCOUNTER — Other Ambulatory Visit (HOSPITAL_COMMUNITY): Payer: Self-pay

## 2024-03-04 ENCOUNTER — Ambulatory Visit: Attending: Vascular Surgery | Admitting: Pharmacist

## 2024-03-04 VITALS — BP 137/67 | HR 60

## 2024-03-04 DIAGNOSIS — I82432 Acute embolism and thrombosis of left popliteal vein: Secondary | ICD-10-CM

## 2024-03-04 MED ORDER — APIXABAN 5 MG PO TABS: 5.0000 mg | ORAL_TABLET | Freq: Two times a day (BID) | ORAL | 2 refills | Status: AC

## 2024-03-04 NOTE — Patient Instructions (Signed)
-  Continue apixaban  (Eliquis ) 5 mg twice daily. -Your refills have been sent to [facility]. You may need to call the pharmacy to ask them to fill this when you start to run low on your current supply.  -Referral has been placed for hematology with Dr. Loretha  -It is important to take your medication around the same time every day.  -Avoid NSAIDs like ibuprofen (Advil, Motrin) and naproxen (Aleve) as well as aspirin doses over 100 mg daily. -Tylenol  (acetaminophen ) is the preferred over the counter pain medication to lower the risk of bleeding. -Be sure to alert all of your health care providers that you are taking an anticoagulant prior to starting a new medication or having a procedure. -Monitor for signs and symptoms of bleeding (abnormal bruising, prolonged bleeding, nose bleeds, bleeding from gums, discolored urine, black tarry stools). If you have fallen and hit your head OR if your bleeding is severe or not stopping, seek emergency care.  -Go to the emergency room if emergent signs and symptoms of new clot occur (new or worse swelling and pain in an arm or leg, shortness of breath, chest pain, fast or irregular heartbeats, lightheadedness, dizziness, fainting, coughing up blood) or if you experience a significant color change (pale or blue) in the extremity that has the DVT.  -We recommend you wear compression stockings (20-30 mmHg) as long as you are having swelling or pain. Be sure to purchase the correct size and take them off at night.   If you have any questions or need to reschedule an appointment, please call [phone]. If you are having an emergency, call 911 or present to the nearest emergency room.   What is a DVT?  -Deep vein thrombosis (DVT) is a condition in which a blood clot forms in a vein of the deep venous system which can occur in the lower leg, thigh, pelvis, arm, or neck. This condition is serious and can be life-threatening if the clot travels to the arteries of  the lungs and causing a blockage (pulmonary embolism, PE). A DVT can also damage veins in the leg, which can lead to long-term venous disease, leg pain, swelling, discoloration, and ulcers or sores (post-thrombotic syndrome).  -Treatment may include taking an anticoagulant medication to prevent more clots from forming and the current clot from growing, wearing compression stockings, and/or surgical procedures to remove or dissolve the clot.

## 2024-03-07 ENCOUNTER — Telehealth: Payer: Self-pay | Admitting: Hematology and Oncology

## 2024-03-07 NOTE — Telephone Encounter (Signed)
 I spoke to patient and she is aware of lab and MD appointments on 03/11/2024.

## 2024-03-10 ENCOUNTER — Other Ambulatory Visit: Payer: Self-pay

## 2024-03-10 DIAGNOSIS — Z170 Estrogen receptor positive status [ER+]: Secondary | ICD-10-CM

## 2024-03-10 NOTE — Progress Notes (Signed)
 Labs have been requested and entered for your scheduled appointment on 03/11/2024, prior to the providers visit.

## 2024-03-11 ENCOUNTER — Inpatient Hospital Stay: Admitting: Hematology and Oncology

## 2024-03-11 ENCOUNTER — Inpatient Hospital Stay: Attending: Hematology and Oncology

## 2024-03-11 VITALS — BP 139/58 | HR 63 | Temp 98.0°F | Resp 18 | Ht 60.0 in | Wt 124.8 lb

## 2024-03-11 DIAGNOSIS — C50411 Malignant neoplasm of upper-outer quadrant of right female breast: Secondary | ICD-10-CM | POA: Diagnosis not present

## 2024-03-11 DIAGNOSIS — Z170 Estrogen receptor positive status [ER+]: Secondary | ICD-10-CM | POA: Diagnosis not present

## 2024-03-11 LAB — CBC WITH DIFFERENTIAL (CANCER CENTER ONLY)
Abs Immature Granulocytes: 0.02 10*3/uL (ref 0.00–0.07)
Basophils Absolute: 0.1 10*3/uL (ref 0.0–0.1)
Basophils Relative: 1 %
Eosinophils Absolute: 0.2 10*3/uL (ref 0.0–0.5)
Eosinophils Relative: 3 %
HCT: 39 % (ref 36.0–46.0)
Hemoglobin: 13.2 g/dL (ref 12.0–15.0)
Immature Granulocytes: 0 %
Lymphocytes Relative: 17 %
Lymphs Abs: 1.4 10*3/uL (ref 0.7–4.0)
MCH: 32.7 pg (ref 26.0–34.0)
MCHC: 33.8 g/dL (ref 30.0–36.0)
MCV: 96.5 fL (ref 80.0–100.0)
Monocytes Absolute: 0.6 10*3/uL (ref 0.1–1.0)
Monocytes Relative: 8 %
Neutro Abs: 5.8 10*3/uL (ref 1.7–7.7)
Neutrophils Relative %: 71 %
Platelet Count: 331 10*3/uL (ref 150–400)
RBC: 4.04 MIL/uL (ref 3.87–5.11)
RDW: 12.4 % (ref 11.5–15.5)
WBC Count: 8.1 10*3/uL (ref 4.0–10.5)
nRBC: 0 % (ref 0.0–0.2)

## 2024-03-11 LAB — BASIC METABOLIC PANEL - CANCER CENTER ONLY
Anion gap: 12 (ref 5–15)
BUN: 26 mg/dL — ABNORMAL HIGH (ref 8–23)
CO2: 25 mmol/L (ref 22–32)
Calcium: 9.8 mg/dL (ref 8.9–10.3)
Chloride: 102 mmol/L (ref 98–111)
Creatinine: 0.99 mg/dL (ref 0.44–1.00)
GFR, Estimated: 54 mL/min — ABNORMAL LOW
Glucose, Bld: 106 mg/dL — ABNORMAL HIGH (ref 70–99)
Potassium: 4.5 mmol/L (ref 3.5–5.1)
Sodium: 139 mmol/L (ref 135–145)

## 2024-03-11 NOTE — Progress Notes (Unsigned)
 " BRIEF ONCOLOGIC HISTORY:  Oncology History  Malignant neoplasm of upper-outer quadrant of right breast in female, estrogen receptor positive (HCC)  04/03/2021 Mammogram   Mammogram done on April 03, 2021 showed possible distortion in the right breast, possible asymmetry in the left breast.  Diagnostic mammogram showed suspicious small mass with associated architectural distortion in the right breast at 10:00, 2 cm from the nipple.  Ultrasound-guided core needle biopsy was recommended    06/15/2021 Pathology Results   Initial biopsy:IDC, g1, ER/PR+, KI67 1%, HER-2 -.     06/29/2021 Initial Diagnosis   Malignant neoplasm of upper-outer quadrant of right breast in female, estrogen receptor positive (HCC)   07/18/2021 Surgery   A. BREAST, RIGHT, LUMPECTOMY:  - Invasive ductal carcinoma, 1.2 cm, grade 1  - Resection margins are negative for carcinoma - closest are the  posterior and superior margins at 0.6 cm  - Biopsy site changes  - See oncology table    07/24/2021 Cancer Staging   Staging form: Breast, AJCC 8th Edition - Pathologic: Stage IA (pT1c, pN0, cM0, G1, ER+, PR+, HER2-) - Signed by Lanell Donald Stagger, PA-C on 07/24/2021 Method of lymph node assessment: Clinical Histologic grading system: 3 grade system   08/10/2021 -  Anti-estrogen oral therapy   Anastrozole      ASSESSMENT AND PLAN:   Malignant neoplasm of upper-outer quadrant of right breast in female, estrogen receptor positive (HCC) This is a very pleasant 89 year old postmenopausal female patient with newly diagnosed right breast invasive ductal carcinoma, tubular type, grade 1, ER 95% strong, PR 40% strong, Ki-67 of 1%, HER2 negative scheduled for right breast lumpectomy on June 6 referred to medical oncology for adjuvant recommendations.   She is status postlumpectomy and is here for follow-up.  Final pathology showed a grade one 1.2 cm IDC, negative margins.  Prior prognostic showed ER +95% strong staining PR 40%  positive strong staining.  She is now on adjuvant anastrozole .  She is tolerating anastrozole  extremely well.  She denies any new health complaints.   Assessment & Plan Acute distal deep vein thrombosis of the left lower extremity Recently diagnosed non-occlusive distal DVT below the knee with improved swelling and no significant impairment of blood flow. Likely multifactorial with stasis from decreased mobility as a significant risk factor. No personal history of prior thrombotic events. No indication for thrombophilia workup given her age and the provoked, isolated nature of the DVT. Increased risk for bleeding while on anticoagulation; fall prevention is critical. - Continued apixaban  for a total of 3 months for anticoagulation. - Instructed her to monitor for symptoms of recurrent DVT or pulmonary embolism, including increased swelling, pain, or dyspnea. - Advised against routine follow-up ultrasound unless new symptoms develop. - Discussed increased bleeding risk with anticoagulation; instructed her to avoid falls and seek immediate medical attention if she sustains head trauma. - Advised to delay resumption of ballroom dancing and tai chi for 2-3 weeks, with reassessment of activity tolerance in one month. - Instructed her to use a walker as needed for stability to reduce fall risk. - No further thrombophilia testing indicated. - Instructed her to contact the clinic for medication access issues.  Estrogen receptor positive right breast cancer, upper-outer quadrant ER+ right breast cancer with favorable prognostic features, diagnosed June 2023. Completed approximately 3 of 5 years of adjuvant anastrozole . Given her advanced age, minimal additional benefit of continued endocrine therapy, and her preference, discontinuation of anastrozole  is reasonable. Remains under surveillance with annual mammography. - Discontinued anastrozole   and removed from medication list. - Ordered annual screening  mammogram at the breast center for February. - Continue annual clinical breast exams and surveillance.  INTERVAL HISTORY:   The patient, with a history of breast cancer, presents for a routine follow-up.  History of Present Illness Candice Brown is a 89 year old female with ER+ right breast cancer who presents for evaluation of a new acute distal DVT of the left lower extremity.  She developed bilateral lower extremity edema, with the right leg more affected than the left. Emergency department evaluation identified a non-occlusive distal DVT in the left popliteal vein below the knee. She was started on apixaban  and subsequently attended a DVT clinic for medication review. Edema in both legs has improved, though intermittent right leg swelling persists and fluctuates throughout the day. She denies significant pain, chest pain, or dyspnea. She has not experienced falls or bleeding symptoms while on anticoagulation. She reports significant fatigue and heaviness in her legs, particularly after attempting ballroom dance lessons three days after the DVT diagnosis, requiring frequent rest. She uses a walker for safety due to concerns about falling while anticoagulated.  She has ER+ right breast cancer, diagnosed in June 2023, with non-aggressive features. She has been on anastrozole  for approximately three of five planned years without adverse effects. She continues annual mammograms and has not had abnormal breast symptoms or findings. She expresses uncertainty about the necessity of ongoing anastrozole  and is amenable to discontinuing it while continuing annual mammographic surveillance.  She and her daughter sought clarification regarding the sequence of referrals from the emergency department to the DVT clinic and then to hematology/oncology, understanding this was to ensure prompt initiation of anticoagulation and medication review. She inquired about the need for further hypercoagulable workup  given a family history of thrombosis, though she has no personal history of thrombotic events. She remains active with ballroom dancing, tai chi, and walking, but has limited activities since the DVT diagnosis and is cautious about activities that may increase her fall risk while on anticoagulation.    ONCOLOGY TREATMENT TEAM:  1. Surgeon:  Dr. Aron at [facility] 2. Medical Oncologist: Dr. Loretha  3. Radiation Oncologist: Dr. Dewey    PAST MEDICAL/SURGICAL HISTORY:  Past Medical History:  Diagnosis Date   Anemia    Arthritis    Colon polyps    hyperplastic   Complication of anesthesia    hard to wake up   Depression    Diverticulosis    GERD (gastroesophageal reflux disease)    Hiatal hernia    Hyperlipidemia    IBS (irritable bowel syndrome)    Internal hemorrhoids    Lung nodule    Occipital neuralgia    Peripheral neuropathy    Past Surgical History:  Procedure Laterality Date   ABDOMINAL HYSTERECTOMY  1972   APPENDECTOMY  1959   BOWEL RESECTION N/A 02/15/2023   Procedure: LYSIS OF ADHESIONS;  Surgeon: Signe Mitzie LABOR, MD;  Location: [facility];  Service: General;  Laterality: N/A;   BREAST LUMPECTOMY Right 07/18/2021   BREAST LUMPECTOMY WITH RADIOACTIVE SEED LOCALIZATION Right 07/18/2021   Procedure: RIGHT BREAST LUMPECTOMY WITH RADIOACTIVE SEED LOCALIZATION;  Surgeon: Aron Shoulders, MD;  Location: [facility];  Service: General;  Laterality: Right;   CATARACT EXTRACTION Bilateral    left 03/2014, right eye 06/2016   CHOLECYSTECTOMY     LAPAROTOMY N/A 02/15/2023   Procedure: EXPLORATORY LAPAROTOMY;  Surgeon: Signe Mitzie LABOR, MD;  Location: [facility];  Service:  General;  Laterality: N/A;     ALLERGIES:  Allergies  Allergen Reactions   Linzess [Linaclotide] Diarrhea    Increased leg cramps   Oxycodone Nausea Only     ( made very sick)   Simvastatin Other (See Comments)    Leg cramps   Benzalkonium Chloride Rash    In neosporin    Neosporin [Neomycin-Polymyxin-Gramicidin] Rash   Tape Rash    Adhesive patches of any kind     CURRENT MEDICATIONS:  Outpatient Encounter Medications as of 03/11/2024  Medication Sig   apixaban  (ELIQUIS ) 5 MG TABS tablet Take 1 tablet (5 mg total) by mouth 2 (two) times daily.   Methylcellulose, Laxative, (CITRUCEL PO) Take 1 tablet by mouth daily. (Patient taking differently: Take 0.5 tablets by mouth every other day.)   Multiple Vitamin (MULTI VITAMIN) TABS Take 1 tablet by mouth daily. Senior woman   NON FORMULARY Take 2 tablets by mouth at bedtime. [place] leg cramp pills   pantoprazole  (PROTONIX ) 40 MG tablet Take 40 mg by mouth daily.    polycarbophil (FIBERCON) 625 MG tablet Take 1,250 mg by mouth at bedtime.   Polyvinyl Alcohol -Povidone (REFRESH OP) Place 1 drop into both eyes daily as needed (Dry eye).   pregabalin  (LYRICA ) 150 MG capsule Take 150 mg by mouth daily.   Probiotic Product (ALIGN PO) Take 1 tablet by mouth daily.   rosuvastatin  (CRESTOR ) 10 MG tablet Take 10 mg by mouth 2 (two) times a week.   [DISCONTINUED] anastrozole  (ARIMIDEX ) 1 MG tablet Take 1 tablet (1 mg total) by mouth daily.   No facility-administered encounter medications on file as of 03/11/2024.     ONCOLOGIC FAMILY HISTORY:  Family History  Problem Relation Age of Onset   Breast cancer Mother 32   Heart disease Father    CVA Father    Heart attack Father 49   Heart attack Paternal Grandfather    Colon cancer Neg Hx    Stomach cancer Neg Hx    Rectal cancer Neg Hx    Esophageal cancer Neg Hx      SOCIAL HISTORY:  Social History   Socioeconomic History   Marital status: Divorced    Spouse name: Not on file   Number of children: 3   Years of education: Masters   Highest education level: Not on file  Occupational History   Occupation: retired  Tobacco Use   Smoking status: Every Day    Current packs/day: 0.00    Average packs/day: 0.1 packs/day    Types: Cigarettes    Last attempt  to quit: 10/13/2016    Years since quitting: 7.4   Smokeless tobacco: Never   Tobacco comments:    5 cigarettes per week.    12/31/18  nicotene gum, lozenges  Vaping Use   Vaping status: Never Used  Substance and Sexual Activity   Alcohol  use: Yes    Alcohol /week: 0.0 standard drinks of alcohol     Comment: less than 1/2 glass wine daily   Drug use: No   Sexual activity: Not on file  Other Topics Concern   Not on file  Social History Narrative   12/31/18 Lives alone   Caffeine- coffee, 1- 1 1/2 cups daily   Social Drivers of Health   Tobacco Use: High Risk (03/04/2024)   Patient History    Smoking Tobacco Use: Every Day    Smokeless Tobacco Use: Never    Passive Exposure: Not on file  Financial Resource Strain: Not on file  Food Insecurity: No Food Insecurity (02/16/2023)   Hunger Vital Sign    Worried About Running Out of Food in the Last Year: Never true    Ran Out of Food in the Last Year: Never true  Transportation Needs: No Transportation Needs (02/16/2023)   PRAPARE - Administrator, Civil Service (Medical): No    Lack of Transportation (Non-Medical): No  Physical Activity: Not on file  Stress: No Stress Concern Present (07/21/2021)   [employer] of Occupational Health - Occupational Stress Questionnaire    Feeling of Stress : Not at all  Social Connections: Socially Integrated (02/16/2023)   Social Connection and Isolation Panel    Frequency of Communication with Friends and Family: Three times a week    Frequency of Social Gatherings with Friends and Family: Three times a week    Attends Religious Services: 1 to 4 times per year    Active Member of Clubs or Organizations: Yes    Attends Banker Meetings: 1 to 4 times per year    Marital Status: Married  Catering Manager Violence: Not At Risk (02/16/2023)   Humiliation, Afraid, Rape, and Kick questionnaire    Fear of Current or Ex-Partner: No    Emotionally Abused: No    Physically Abused: No     Sexually Abused: No  Depression (PHQ2-9): Not on file  Alcohol  Screen: Not on file  Housing: Unknown (03/21/2023)   Received from [facility]   Epic    Unable to Pay for Housing in the Last Year: Not on file    Number of Times Moved in the Last Year: Not on file    At any time in the past 12 months, were you homeless or living in a shelter (including now)?: No  Utilities: Not At Risk (02/16/2023)   [facility] Utilities    Threatened with loss of utilities: No  Health Literacy: Not on file     OBSERVATIONS/OBJECTIVE:  BP (!) 139/58 (BP Location: Left Arm, Patient Position: Sitting)   Pulse 63   Temp 98 F (36.7 C) (Tympanic)   Resp 18   Ht 5' (1.524 m)   Wt 124 lb 12.8 oz (56.6 kg)   SpO2 96%   BMI 24.37 kg/m   General appearance: Alert, oriented and in no acute distress Chest: Bilateral breasts inspected.  No palpable masses.  No regional adenopathy.   LABORATORY DATA:  None for this visit.  DIAGNOSTIC IMAGING:  None for this visit.   Total time spent: 30 min  *Total Encounter Time as defined by the Centers for Medicare and Medicaid Services includes, in addition to the face-to-face time of a patient visit (documented in the note above) non-face-to-face time: obtaining and reviewing outside history, ordering and reviewing medications, tests or procedures, care coordination (communications with other health care professionals or caregivers) and documentation in the medical record.  "

## 2024-04-10 ENCOUNTER — Encounter

## 2025-03-12 ENCOUNTER — Inpatient Hospital Stay: Admitting: Hematology and Oncology
